# Patient Record
Sex: Male | Born: 1940 | Race: White | Hispanic: No | Marital: Single | Smoking: Former smoker
Health system: Southern US, Community
[De-identification: ages and names within clinical notes are randomized; demographics above are authoritative.]

## PROBLEM LIST (undated history)

## (undated) DIAGNOSIS — K8689 Other specified diseases of pancreas: Secondary | ICD-10-CM

## (undated) DIAGNOSIS — F329 Major depressive disorder, single episode, unspecified: Secondary | ICD-10-CM

## (undated) DIAGNOSIS — C3490 Malignant neoplasm of unspecified part of unspecified bronchus or lung: Secondary | ICD-10-CM

## (undated) DIAGNOSIS — T884XXA Failed or difficult intubation, initial encounter: Secondary | ICD-10-CM

## (undated) DIAGNOSIS — F4310 Post-traumatic stress disorder, unspecified: Secondary | ICD-10-CM

## (undated) DIAGNOSIS — F32A Depression, unspecified: Secondary | ICD-10-CM

## (undated) DIAGNOSIS — I10 Essential (primary) hypertension: Secondary | ICD-10-CM

## (undated) DIAGNOSIS — K219 Gastro-esophageal reflux disease without esophagitis: Secondary | ICD-10-CM

## (undated) DIAGNOSIS — Z9981 Dependence on supplemental oxygen: Secondary | ICD-10-CM

## (undated) DIAGNOSIS — E119 Type 2 diabetes mellitus without complications: Secondary | ICD-10-CM

## (undated) DIAGNOSIS — J45909 Unspecified asthma, uncomplicated: Secondary | ICD-10-CM

## (undated) DIAGNOSIS — I2510 Atherosclerotic heart disease of native coronary artery without angina pectoris: Secondary | ICD-10-CM

## (undated) DIAGNOSIS — G4730 Sleep apnea, unspecified: Secondary | ICD-10-CM

## (undated) DIAGNOSIS — J449 Chronic obstructive pulmonary disease, unspecified: Secondary | ICD-10-CM

## (undated) HISTORY — PX: KNEE SURGERY: SHX244

## (undated) HISTORY — PX: HERNIA REPAIR: SHX51

---

## 2004-07-23 ENCOUNTER — Inpatient Hospital Stay: Payer: Self-pay | Admitting: Internal Medicine

## 2004-09-03 ENCOUNTER — Ambulatory Visit: Payer: Self-pay | Admitting: Internal Medicine

## 2005-06-09 ENCOUNTER — Ambulatory Visit: Payer: Self-pay | Admitting: Internal Medicine

## 2005-07-07 ENCOUNTER — Emergency Department: Payer: Self-pay | Admitting: Internal Medicine

## 2005-07-31 ENCOUNTER — Ambulatory Visit: Payer: Self-pay | Admitting: Gastroenterology

## 2005-12-22 ENCOUNTER — Emergency Department: Payer: Self-pay | Admitting: Unknown Physician Specialty

## 2005-12-22 ENCOUNTER — Other Ambulatory Visit: Payer: Self-pay

## 2005-12-25 ENCOUNTER — Ambulatory Visit: Payer: Self-pay

## 2006-01-01 ENCOUNTER — Ambulatory Visit: Payer: Self-pay | Admitting: Nurse Practitioner

## 2006-05-13 ENCOUNTER — Ambulatory Visit: Payer: Self-pay | Admitting: Cardiology

## 2006-08-14 ENCOUNTER — Ambulatory Visit: Payer: Self-pay | Admitting: Internal Medicine

## 2007-03-03 ENCOUNTER — Ambulatory Visit: Payer: Self-pay | Admitting: Internal Medicine

## 2007-03-10 ENCOUNTER — Ambulatory Visit: Payer: Self-pay | Admitting: Internal Medicine

## 2007-03-18 ENCOUNTER — Ambulatory Visit: Payer: Self-pay | Admitting: Internal Medicine

## 2007-03-26 ENCOUNTER — Emergency Department: Payer: Self-pay | Admitting: Emergency Medicine

## 2007-04-07 ENCOUNTER — Ambulatory Visit: Payer: Self-pay | Admitting: Internal Medicine

## 2007-05-08 ENCOUNTER — Ambulatory Visit: Payer: Self-pay | Admitting: Internal Medicine

## 2007-07-14 ENCOUNTER — Ambulatory Visit: Payer: Self-pay | Admitting: Specialist

## 2007-11-04 ENCOUNTER — Ambulatory Visit: Payer: Self-pay | Admitting: Specialist

## 2007-12-21 ENCOUNTER — Ambulatory Visit: Payer: Self-pay | Admitting: Gastroenterology

## 2008-01-05 ENCOUNTER — Ambulatory Visit: Payer: Self-pay | Admitting: Gastroenterology

## 2008-01-06 ENCOUNTER — Ambulatory Visit: Payer: Self-pay | Admitting: Gastroenterology

## 2009-04-06 ENCOUNTER — Ambulatory Visit: Payer: Self-pay | Admitting: Specialist

## 2009-05-16 ENCOUNTER — Ambulatory Visit: Payer: Self-pay | Admitting: Specialist

## 2009-12-11 ENCOUNTER — Encounter: Payer: Self-pay | Admitting: Specialist

## 2010-01-04 ENCOUNTER — Encounter: Payer: Self-pay | Admitting: Specialist

## 2010-02-04 ENCOUNTER — Encounter: Payer: Self-pay | Admitting: Specialist

## 2010-03-07 ENCOUNTER — Encounter: Payer: Self-pay | Admitting: Specialist

## 2010-04-06 ENCOUNTER — Encounter: Payer: Self-pay | Admitting: Specialist

## 2010-04-29 ENCOUNTER — Ambulatory Visit: Payer: Self-pay | Admitting: Internal Medicine

## 2010-10-22 ENCOUNTER — Ambulatory Visit: Payer: Self-pay | Admitting: Gastroenterology

## 2010-10-23 ENCOUNTER — Ambulatory Visit: Payer: Self-pay | Admitting: Unknown Physician Specialty

## 2011-01-21 ENCOUNTER — Ambulatory Visit: Payer: Self-pay | Admitting: Gastroenterology

## 2011-01-23 LAB — PATHOLOGY REPORT

## 2011-02-25 ENCOUNTER — Ambulatory Visit: Payer: Self-pay | Admitting: Gastroenterology

## 2011-02-27 LAB — PATHOLOGY REPORT

## 2011-03-21 ENCOUNTER — Emergency Department: Payer: Self-pay | Admitting: Emergency Medicine

## 2011-05-08 ENCOUNTER — Ambulatory Visit: Payer: Self-pay | Admitting: Internal Medicine

## 2011-12-02 ENCOUNTER — Ambulatory Visit: Payer: Self-pay | Admitting: Urology

## 2011-12-03 LAB — CBC WITH DIFFERENTIAL/PLATELET
Eosinophil #: 0.3 10*3/uL (ref 0.0–0.7)
Eosinophil %: 2.6 %
HCT: 35.8 % — ABNORMAL LOW (ref 40.0–52.0)
HGB: 12.1 g/dL — ABNORMAL LOW (ref 13.0–18.0)
Lymphocyte #: 2.9 10*3/uL (ref 1.0–3.6)
Lymphocyte %: 23 %
MCHC: 33.7 g/dL (ref 32.0–36.0)
Monocyte %: 5.7 %
Neutrophil #: 8.5 10*3/uL — ABNORMAL HIGH (ref 1.4–6.5)
Neutrophil %: 67.9 %
Platelet: 200 10*3/uL (ref 150–440)
RBC: 4.24 10*6/uL — ABNORMAL LOW (ref 4.40–5.90)
RDW: 14.9 % — ABNORMAL HIGH (ref 11.5–14.5)

## 2011-12-03 LAB — BASIC METABOLIC PANEL
Anion Gap: 8 (ref 7–16)
BUN: 12 mg/dL (ref 7–18)
Calcium, Total: 8.3 mg/dL — ABNORMAL LOW (ref 8.5–10.1)
Co2: 27 mmol/L (ref 21–32)
Creatinine: 0.86 mg/dL (ref 0.60–1.30)
EGFR (Non-African Amer.): >60
Glucose: 168 mg/dL — ABNORMAL HIGH (ref 65–99)
Osmolality: 285 (ref 275–301)
Potassium: 3.8 mmol/L (ref 3.5–5.1)
Sodium: 141 mmol/L (ref 136–145)

## 2011-12-03 LAB — MAGNESIUM: Magnesium: 1.8 mg/dL

## 2011-12-04 ENCOUNTER — Emergency Department: Payer: Self-pay | Admitting: Emergency Medicine

## 2012-03-10 ENCOUNTER — Ambulatory Visit: Payer: Self-pay | Admitting: Internal Medicine

## 2012-08-02 ENCOUNTER — Ambulatory Visit: Payer: Self-pay | Admitting: Internal Medicine

## 2012-08-18 DIAGNOSIS — R350 Frequency of micturition: Secondary | ICD-10-CM | POA: Insufficient documentation

## 2012-08-18 DIAGNOSIS — N319 Neuromuscular dysfunction of bladder, unspecified: Secondary | ICD-10-CM | POA: Insufficient documentation

## 2012-08-18 DIAGNOSIS — N3281 Overactive bladder: Secondary | ICD-10-CM | POA: Insufficient documentation

## 2012-08-18 DIAGNOSIS — N138 Other obstructive and reflux uropathy: Secondary | ICD-10-CM | POA: Insufficient documentation

## 2012-08-18 DIAGNOSIS — R339 Retention of urine, unspecified: Secondary | ICD-10-CM | POA: Insufficient documentation

## 2012-08-18 DIAGNOSIS — R972 Elevated prostate specific antigen [PSA]: Secondary | ICD-10-CM | POA: Insufficient documentation

## 2012-08-18 DIAGNOSIS — N401 Enlarged prostate with lower urinary tract symptoms: Secondary | ICD-10-CM | POA: Insufficient documentation

## 2013-01-14 ENCOUNTER — Emergency Department: Payer: Self-pay | Admitting: Emergency Medicine

## 2013-01-14 LAB — COMPREHENSIVE METABOLIC PANEL
Albumin: 3.4 g/dL (ref 3.4–5.0)
Anion Gap: 7 (ref 7–16)
BUN: 14 mg/dL (ref 7–18)
Bilirubin,Total: 0.9 mg/dL (ref 0.2–1.0)
Calcium, Total: 9.5 mg/dL (ref 8.5–10.1)
Chloride: 101 mmol/L (ref 98–107)
Creatinine: 1.27 mg/dL (ref 0.60–1.30)
EGFR (Non-African Amer.): 57 — ABNORMAL LOW
Glucose: 158 mg/dL — ABNORMAL HIGH (ref 65–99)
Osmolality: 274 (ref 275–301)
Potassium: 3.8 mmol/L (ref 3.5–5.1)
SGOT(AST): 17 U/L (ref 15–37)
SGPT (ALT): 16 U/L (ref 12–78)
Sodium: 135 mmol/L — ABNORMAL LOW (ref 136–145)
Total Protein: 7.4 g/dL (ref 6.4–8.2)

## 2013-01-14 LAB — CBC
HCT: 40 % (ref 40.0–52.0)
MCHC: 33.7 g/dL (ref 32.0–36.0)
Platelet: 161 10*3/uL (ref 150–440)
RBC: 4.71 10*6/uL (ref 4.40–5.90)
WBC: 12.8 10*3/uL — ABNORMAL HIGH (ref 3.8–10.6)

## 2013-01-14 LAB — TROPONIN I: Troponin-I: <0.02 ng/mL

## 2013-07-07 ENCOUNTER — Ambulatory Visit: Payer: Self-pay | Admitting: Internal Medicine

## 2013-07-12 ENCOUNTER — Ambulatory Visit: Payer: Self-pay | Admitting: Specialist

## 2013-07-13 ENCOUNTER — Ambulatory Visit: Payer: Self-pay | Admitting: Ophthalmology

## 2013-07-18 ENCOUNTER — Ambulatory Visit: Payer: Self-pay | Admitting: Specialist

## 2013-07-18 LAB — TROPONIN I

## 2013-07-18 LAB — CBC
HCT: 44.3 % (ref 40.0–52.0)
HGB: 14.6 g/dL (ref 13.0–18.0)
MCH: 27.5 pg (ref 26.0–34.0)
MCHC: 32.9 g/dL (ref 32.0–36.0)
MCV: 83 fL (ref 80–100)
Platelet: 142 10*3/uL — ABNORMAL LOW (ref 150–440)
RBC: 5.31 10*6/uL (ref 4.40–5.90)
RDW: 16.4 % — AB (ref 11.5–14.5)
WBC: 18.3 10*3/uL — ABNORMAL HIGH (ref 3.8–10.6)

## 2013-07-18 LAB — BASIC METABOLIC PANEL
ANION GAP: 3 — AB (ref 7–16)
BUN: 12 mg/dL (ref 7–18)
CALCIUM: 9.3 mg/dL (ref 8.5–10.1)
CO2: 28 mmol/L (ref 21–32)
Chloride: 100 mmol/L (ref 98–107)
Creatinine: 1.07 mg/dL (ref 0.60–1.30)
EGFR (Non-African Amer.): >60
Glucose: 263 mg/dL — ABNORMAL HIGH (ref 65–99)
OSMOLALITY: 272 (ref 275–301)
Potassium: 4.6 mmol/L (ref 3.5–5.1)
Sodium: 131 mmol/L — ABNORMAL LOW (ref 136–145)

## 2013-07-19 ENCOUNTER — Inpatient Hospital Stay: Payer: Self-pay | Admitting: Internal Medicine

## 2013-07-19 LAB — PRO B NATRIURETIC PEPTIDE: B-Type Natriuretic Peptide: 28 pg/mL (ref 0–125)

## 2013-07-20 LAB — CBC WITH DIFFERENTIAL/PLATELET
BASOS ABS: 0 10*3/uL (ref 0.0–0.1)
BASOS PCT: 0.1 %
EOS ABS: 0 10*3/uL (ref 0.0–0.7)
EOS PCT: 0 %
HCT: 40.1 % (ref 40.0–52.0)
HGB: 13.2 g/dL (ref 13.0–18.0)
LYMPHS PCT: 3.1 %
Lymphocyte #: 0.6 10*3/uL — ABNORMAL LOW (ref 1.0–3.6)
MCH: 27.4 pg (ref 26.0–34.0)
MCHC: 32.9 g/dL (ref 32.0–36.0)
MCV: 83 fL (ref 80–100)
MONOS PCT: 2.3 %
Monocyte #: 0.5 x10 3/mm (ref 0.2–1.0)
Neutrophil #: 18.6 10*3/uL — ABNORMAL HIGH (ref 1.4–6.5)
Neutrophil %: 94.5 %
PLATELETS: 137 10*3/uL — AB (ref 150–440)
RBC: 4.81 10*6/uL (ref 4.40–5.90)
RDW: 16.6 % — ABNORMAL HIGH (ref 11.5–14.5)
WBC: 19.7 10*3/uL — ABNORMAL HIGH (ref 3.8–10.6)

## 2013-07-20 LAB — BASIC METABOLIC PANEL
Anion Gap: 6 — ABNORMAL LOW (ref 7–16)
BUN: 19 mg/dL — AB (ref 7–18)
CHLORIDE: 100 mmol/L (ref 98–107)
Calcium, Total: 9.3 mg/dL (ref 8.5–10.1)
Co2: 26 mmol/L (ref 21–32)
Creatinine: 1.04 mg/dL (ref 0.60–1.30)
GLUCOSE: 279 mg/dL — AB (ref 65–99)
Osmolality: 277 (ref 275–301)
POTASSIUM: 3.8 mmol/L (ref 3.5–5.1)
SODIUM: 132 mmol/L — AB (ref 136–145)

## 2013-07-20 LAB — APTT: Activated PTT: 37.9 secs — ABNORMAL HIGH (ref 23.6–35.9)

## 2013-07-20 LAB — PROTIME-INR
INR: 1.1
Prothrombin Time: 14.2 secs (ref 11.5–14.7)

## 2013-07-20 LAB — THEOPHYLLINE LEVEL: Theophylline: <2 ug/mL — ABNORMAL LOW (ref 10.0–20.0)

## 2013-07-21 LAB — BASIC METABOLIC PANEL WITH GFR
Anion Gap: 4 — ABNORMAL LOW
BUN: 27 mg/dL — ABNORMAL HIGH
Calcium, Total: 9.4 mg/dL
Chloride: 100 mmol/L
Co2: 28 mmol/L
Creatinine: 1.13 mg/dL
EGFR (African American): >60
EGFR (Non-African Amer.): >60
Glucose: 322 mg/dL — ABNORMAL HIGH
Osmolality: 282
Potassium: 4 mmol/L
Sodium: 132 mmol/L — ABNORMAL LOW

## 2013-07-21 LAB — CBC WITH DIFFERENTIAL/PLATELET
Basophil #: 0 10*3/uL (ref 0.0–0.1)
Basophil %: 0.1 %
EOS PCT: 0 %
Eosinophil #: 0 10*3/uL (ref 0.0–0.7)
HCT: 41.1 % (ref 40.0–52.0)
HGB: 13.4 g/dL (ref 13.0–18.0)
LYMPHS ABS: 0.6 10*3/uL — AB (ref 1.0–3.6)
Lymphocyte %: 3.2 %
MCH: 27.2 pg (ref 26.0–34.0)
MCHC: 32.7 g/dL (ref 32.0–36.0)
MCV: 83 fL (ref 80–100)
MONO ABS: 0.4 x10 3/mm (ref 0.2–1.0)
Monocyte %: 2.2 %
NEUTROS ABS: 17.7 10*3/uL — AB (ref 1.4–6.5)
NEUTROS PCT: 94.5 %
Platelet: 160 10*3/uL (ref 150–440)
RBC: 4.94 10*6/uL (ref 4.40–5.90)
RDW: 16.9 % — ABNORMAL HIGH (ref 11.5–14.5)
WBC: 18.7 10*3/uL — ABNORMAL HIGH (ref 3.8–10.6)

## 2013-07-23 LAB — CULTURE, BLOOD (SINGLE)

## 2013-07-27 ENCOUNTER — Ambulatory Visit: Payer: Self-pay | Admitting: Internal Medicine

## 2013-08-02 ENCOUNTER — Ambulatory Visit: Payer: Self-pay | Admitting: Internal Medicine

## 2013-08-07 ENCOUNTER — Ambulatory Visit: Payer: Self-pay | Admitting: Internal Medicine

## 2013-08-10 ENCOUNTER — Ambulatory Visit: Payer: Self-pay | Admitting: Internal Medicine

## 2013-08-13 LAB — PATHOLOGY REPORT

## 2013-08-29 ENCOUNTER — Ambulatory Visit: Payer: Self-pay | Admitting: Cardiothoracic Surgery

## 2013-08-29 LAB — COMPREHENSIVE METABOLIC PANEL
ALBUMIN: 3.5 g/dL (ref 3.4–5.0)
ALK PHOS: 104 U/L
ALT: 19 U/L (ref 12–78)
Anion Gap: 11 (ref 7–16)
BILIRUBIN TOTAL: 0.6 mg/dL (ref 0.2–1.0)
BUN: 14 mg/dL (ref 7–18)
CALCIUM: 9 mg/dL (ref 8.5–10.1)
CO2: 28 mmol/L (ref 21–32)
Chloride: 99 mmol/L (ref 98–107)
Creatinine: 1.22 mg/dL (ref 0.60–1.30)
EGFR (African American): >60
GFR CALC NON AF AMER: 60 — AB
GLUCOSE: 162 mg/dL — AB (ref 65–99)
Osmolality: 280 (ref 275–301)
Potassium: 4 mmol/L (ref 3.5–5.1)
SGOT(AST): 12 U/L — ABNORMAL LOW (ref 15–37)
Sodium: 138 mmol/L (ref 136–145)
Total Protein: 7.3 g/dL (ref 6.4–8.2)

## 2013-08-29 LAB — CBC CANCER CENTER
Basophil #: 0 x10 3/mm (ref 0.0–0.1)
Basophil %: 0.2 %
Eosinophil #: 0.2 x10 3/mm (ref 0.0–0.7)
Eosinophil %: 2 %
HCT: 43.5 % (ref 40.0–52.0)
HGB: 14 g/dL (ref 13.0–18.0)
Lymphocyte #: 2.9 x10 3/mm (ref 1.0–3.6)
Lymphocyte %: 27.4 %
MCH: 26.8 pg (ref 26.0–34.0)
MCHC: 32.2 g/dL (ref 32.0–36.0)
MCV: 83 fL (ref 80–100)
MONO ABS: 0.6 x10 3/mm (ref 0.2–1.0)
Monocyte %: 5.7 %
NEUTROS ABS: 6.7 x10 3/mm — AB (ref 1.4–6.5)
Neutrophil %: 64.7 %
PLATELETS: 181 x10 3/mm (ref 150–440)
RBC: 5.22 10*6/uL (ref 4.40–5.90)
RDW: 17.4 % — ABNORMAL HIGH (ref 11.5–14.5)
WBC: 10.4 x10 3/mm (ref 3.8–10.6)

## 2013-08-29 LAB — PROTIME-INR
INR: 1
Prothrombin Time: 12.8 secs (ref 11.5–14.7)

## 2013-08-29 LAB — APTT: Activated PTT: 27.4 secs (ref 23.6–35.9)

## 2013-08-31 ENCOUNTER — Ambulatory Visit: Payer: Self-pay | Admitting: Ophthalmology

## 2013-09-04 ENCOUNTER — Ambulatory Visit: Payer: Self-pay | Admitting: Cardiothoracic Surgery

## 2013-09-08 ENCOUNTER — Ambulatory Visit: Payer: Self-pay | Admitting: Cardiothoracic Surgery

## 2013-09-12 LAB — PATHOLOGY REPORT

## 2013-10-05 ENCOUNTER — Ambulatory Visit: Payer: Self-pay | Admitting: Cardiothoracic Surgery

## 2013-11-04 ENCOUNTER — Ambulatory Visit: Payer: Self-pay | Admitting: Cardiothoracic Surgery

## 2013-11-17 DIAGNOSIS — G4710 Hypersomnia, unspecified: Secondary | ICD-10-CM | POA: Insufficient documentation

## 2013-11-17 DIAGNOSIS — J449 Chronic obstructive pulmonary disease, unspecified: Secondary | ICD-10-CM | POA: Insufficient documentation

## 2013-11-17 DIAGNOSIS — I10 Essential (primary) hypertension: Secondary | ICD-10-CM | POA: Insufficient documentation

## 2013-11-17 DIAGNOSIS — G4730 Sleep apnea, unspecified: Secondary | ICD-10-CM

## 2013-11-17 DIAGNOSIS — E782 Mixed hyperlipidemia: Secondary | ICD-10-CM | POA: Insufficient documentation

## 2013-11-17 DIAGNOSIS — E7800 Pure hypercholesterolemia, unspecified: Secondary | ICD-10-CM | POA: Insufficient documentation

## 2013-11-17 DIAGNOSIS — I2510 Atherosclerotic heart disease of native coronary artery without angina pectoris: Secondary | ICD-10-CM | POA: Insufficient documentation

## 2013-11-17 DIAGNOSIS — J431 Panlobular emphysema: Secondary | ICD-10-CM | POA: Insufficient documentation

## 2014-01-24 DIAGNOSIS — Z9889 Other specified postprocedural states: Secondary | ICD-10-CM | POA: Insufficient documentation

## 2014-01-24 DIAGNOSIS — R0681 Apnea, not elsewhere classified: Secondary | ICD-10-CM | POA: Insufficient documentation

## 2014-02-14 ENCOUNTER — Ambulatory Visit: Payer: Self-pay | Admitting: Radiation Oncology

## 2014-02-15 ENCOUNTER — Ambulatory Visit: Payer: Self-pay | Admitting: Radiation Oncology

## 2014-03-07 ENCOUNTER — Ambulatory Visit: Payer: Self-pay | Admitting: Radiation Oncology

## 2014-05-07 ENCOUNTER — Ambulatory Visit: Payer: Self-pay | Admitting: Internal Medicine

## 2014-05-16 ENCOUNTER — Inpatient Hospital Stay: Payer: Self-pay | Admitting: Internal Medicine

## 2014-05-16 DIAGNOSIS — E1165 Type 2 diabetes mellitus with hyperglycemia: Secondary | ICD-10-CM | POA: Insufficient documentation

## 2014-05-16 LAB — COMPREHENSIVE METABOLIC PANEL
ANION GAP: 11 (ref 7–16)
Albumin: 3.9 g/dL (ref 3.4–5.0)
Alkaline Phosphatase: 111 U/L
BUN: 37 mg/dL — ABNORMAL HIGH (ref 7–18)
Bilirubin,Total: 0.7 mg/dL (ref 0.2–1.0)
CALCIUM: 9.2 mg/dL (ref 8.5–10.1)
CO2: 29 mmol/L (ref 21–32)
Chloride: 89 mmol/L — ABNORMAL LOW (ref 98–107)
Creatinine: 1.82 mg/dL — ABNORMAL HIGH (ref 0.60–1.30)
EGFR (Non-African Amer.): 39 — ABNORMAL LOW
GFR CALC AF AMER: 48 — AB
Glucose: 347 mg/dL — ABNORMAL HIGH (ref 65–99)
OSMOLALITY: 281 (ref 275–301)
POTASSIUM: 3.4 mmol/L — AB (ref 3.5–5.1)
SGOT(AST): 15 U/L (ref 15–37)
SGPT (ALT): 21 U/L
Sodium: 129 mmol/L — ABNORMAL LOW (ref 136–145)
Total Protein: 7.3 g/dL (ref 6.4–8.2)

## 2014-05-16 LAB — CBC
HCT: 46.3 % (ref 40.0–52.0)
HGB: 15.4 g/dL (ref 13.0–18.0)
MCH: 27.7 pg (ref 26.0–34.0)
MCHC: 33.2 g/dL (ref 32.0–36.0)
MCV: 83 fL (ref 80–100)
Platelet: 179 10*3/uL (ref 150–440)
RBC: 5.56 10*6/uL (ref 4.40–5.90)
RDW: 15.5 % — ABNORMAL HIGH (ref 11.5–14.5)
WBC: 16.4 10*3/uL — AB (ref 3.8–10.6)

## 2014-05-16 LAB — TROPONIN I: Troponin-I: <0.02 ng/mL

## 2014-05-16 LAB — CK TOTAL AND CKMB (NOT AT ARMC)
CK, TOTAL: 338 U/L — AB
CK-MB: 3.9 ng/mL — AB (ref 0.5–3.6)

## 2014-05-16 LAB — MAGNESIUM: MAGNESIUM: 1.9 mg/dL

## 2014-05-17 LAB — CBC WITH DIFFERENTIAL/PLATELET
BASOS PCT: 0.3 %
Basophil #: 0 10*3/uL (ref 0.0–0.1)
EOS PCT: 0 %
Eosinophil #: 0 10*3/uL (ref 0.0–0.7)
HCT: 43.8 % (ref 40.0–52.0)
HGB: 14.7 g/dL (ref 13.0–18.0)
Lymphocyte #: 0.7 10*3/uL — ABNORMAL LOW (ref 1.0–3.6)
Lymphocyte %: 5.5 %
MCH: 28.2 pg (ref 26.0–34.0)
MCHC: 33.6 g/dL (ref 32.0–36.0)
MCV: 84 fL (ref 80–100)
Monocyte #: 0.1 x10 3/mm — ABNORMAL LOW (ref 0.2–1.0)
Monocyte %: 0.5 %
NEUTROS PCT: 93.7 %
Neutrophil #: 11.6 10*3/uL — ABNORMAL HIGH (ref 1.4–6.5)
Platelet: 154 10*3/uL (ref 150–440)
RBC: 5.21 10*6/uL (ref 4.40–5.90)
RDW: 15.4 % — ABNORMAL HIGH (ref 11.5–14.5)
WBC: 12.4 10*3/uL — ABNORMAL HIGH (ref 3.8–10.6)

## 2014-05-17 LAB — BASIC METABOLIC PANEL
Anion Gap: 10 (ref 7–16)
BUN: 37 mg/dL — ABNORMAL HIGH (ref 7–18)
CO2: 33 mmol/L — AB (ref 21–32)
Calcium, Total: 9.3 mg/dL (ref 8.5–10.1)
Chloride: 91 mmol/L — ABNORMAL LOW (ref 98–107)
Creatinine: 1.67 mg/dL — ABNORMAL HIGH (ref 0.60–1.30)
EGFR (African American): 53 — ABNORMAL LOW
EGFR (Non-African Amer.): 43 — ABNORMAL LOW
Glucose: 332 mg/dL — ABNORMAL HIGH (ref 65–99)
Osmolality: 290 (ref 275–301)
POTASSIUM: 3.9 mmol/L (ref 3.5–5.1)
SODIUM: 134 mmol/L — AB (ref 136–145)

## 2014-05-18 LAB — CBC WITH DIFFERENTIAL/PLATELET
BASOS ABS: 0 10*3/uL (ref 0.0–0.1)
BASOS PCT: 0.1 %
EOS ABS: 0 10*3/uL (ref 0.0–0.7)
Eosinophil %: 0 %
HCT: 39.8 % — AB (ref 40.0–52.0)
HGB: 13.2 g/dL (ref 13.0–18.0)
LYMPHS ABS: 0.7 10*3/uL — AB (ref 1.0–3.6)
LYMPHS PCT: 4.4 %
MCH: 28 pg (ref 26.0–34.0)
MCHC: 33.1 g/dL (ref 32.0–36.0)
MCV: 85 fL (ref 80–100)
Monocyte #: 0.6 x10 3/mm (ref 0.2–1.0)
Monocyte %: 3.6 %
NEUTROS ABS: 15.5 10*3/uL — AB (ref 1.4–6.5)
NEUTROS PCT: 91.9 %
PLATELETS: 148 10*3/uL — AB (ref 150–440)
RBC: 4.7 10*6/uL (ref 4.40–5.90)
RDW: 15.5 % — ABNORMAL HIGH (ref 11.5–14.5)
WBC: 16.8 10*3/uL — ABNORMAL HIGH (ref 3.8–10.6)

## 2014-05-18 LAB — BASIC METABOLIC PANEL
ANION GAP: 8 (ref 7–16)
BUN: 37 mg/dL — ABNORMAL HIGH (ref 7–18)
CALCIUM: 8.8 mg/dL (ref 8.5–10.1)
Chloride: 100 mmol/L (ref 98–107)
Co2: 30 mmol/L (ref 21–32)
Creatinine: 1.34 mg/dL — ABNORMAL HIGH (ref 0.60–1.30)
EGFR (Non-African Amer.): 56 — ABNORMAL LOW
GLUCOSE: 316 mg/dL — AB (ref 65–99)
Osmolality: 296 (ref 275–301)
Potassium: 4.5 mmol/L (ref 3.5–5.1)
SODIUM: 138 mmol/L (ref 136–145)

## 2014-05-19 LAB — BASIC METABOLIC PANEL
ANION GAP: 4 — AB (ref 7–16)
BUN: 29 mg/dL — ABNORMAL HIGH (ref 7–18)
Calcium, Total: 8.3 mg/dL — ABNORMAL LOW (ref 8.5–10.1)
Chloride: 105 mmol/L (ref 98–107)
Co2: 30 mmol/L (ref 21–32)
Creatinine: 1.18 mg/dL (ref 0.60–1.30)
EGFR (African American): >60
EGFR (Non-African Amer.): >60
Glucose: 134 mg/dL — ABNORMAL HIGH (ref 65–99)
Osmolality: 285 (ref 275–301)
POTASSIUM: 4.4 mmol/L (ref 3.5–5.1)
SODIUM: 139 mmol/L (ref 136–145)

## 2014-05-19 LAB — CBC WITH DIFFERENTIAL/PLATELET
BASOS ABS: 0 10*3/uL (ref 0.0–0.1)
Basophil %: 0.1 %
Eosinophil #: 0 10*3/uL (ref 0.0–0.7)
Eosinophil %: 0 %
HCT: 40.9 % (ref 40.0–52.0)
HGB: 13.3 g/dL (ref 13.0–18.0)
LYMPHS ABS: 0.9 10*3/uL — AB (ref 1.0–3.6)
LYMPHS PCT: 5.7 %
MCH: 27.6 pg (ref 26.0–34.0)
MCHC: 32.5 g/dL (ref 32.0–36.0)
MCV: 85 fL (ref 80–100)
MONOS PCT: 2.6 %
Monocyte #: 0.4 x10 3/mm (ref 0.2–1.0)
Neutrophil #: 14.3 10*3/uL — ABNORMAL HIGH (ref 1.4–6.5)
Neutrophil %: 91.6 %
Platelet: 154 10*3/uL (ref 150–440)
RBC: 4.81 10*6/uL (ref 4.40–5.90)
RDW: 15.3 % — AB (ref 11.5–14.5)
WBC: 15.6 10*3/uL — ABNORMAL HIGH (ref 3.8–10.6)

## 2014-06-06 ENCOUNTER — Ambulatory Visit: Payer: Self-pay | Admitting: Internal Medicine

## 2014-08-17 ENCOUNTER — Ambulatory Visit: Payer: Self-pay | Admitting: Radiation Oncology

## 2014-09-05 ENCOUNTER — Ambulatory Visit
Admit: 2014-09-05 | Disposition: A | Discharge status: Home / Self Care | Payer: Self-pay | Attending: Radiation Oncology | Admitting: Radiation Oncology

## 2014-10-06 ENCOUNTER — Ambulatory Visit
Admit: 2014-10-06 | Disposition: A | Discharge status: Home / Self Care | Payer: Self-pay | Attending: Radiation Oncology | Admitting: Radiation Oncology

## 2014-10-17 ENCOUNTER — Ambulatory Visit: Admit: 2014-10-17 | Disposition: A | Discharge status: Home / Self Care | Payer: Self-pay | Admitting: Specialist

## 2014-10-28 NOTE — Consult Note (Signed)
PATIENT NAME:  Earl Clay, Earl Clay MR#:  [id number] DATE OF BIRTH:  22-Nov-1940  DATE OF CONSULTATION:  05/17/2014  REFERRING PHYSICIAN:  Leonie Clay. Earl Hutching, MD.  CONSULTING PHYSICIAN:  Earl Noxon E. Raul Del, MD  REASON FOR CONSULTATION: Shortness of breath.   HISTORY OF PRESENT ILLNESS: [name] is a 74 year old gentleman well known to [place]. I have been following him closely on an outpatient basis. He is known to have advanced COPD, on oxygen supplementation 24/7.  Obstructive sleep apnea on cpap.  History of non-small cell cancer status post radiation, here because of increased shortness of breath. On arrival he was noted to be hypoxic with oxygen saturation 75% on room air.  He was placed on 6-7 liters of oxygen.  His saturations were barely above 85%. Hence, he was admitted to the telemetry floor for what sounds like a COPD exacerbation.  He did not appear to be in CHF; not having any chest pain, leg pain, calf pain, recent travel. His chest x-ray shows a known left hilar fullness, per radiologist it is a little larger than on the previous xrays. He is also followed by oncology. At the time I saw him he was feeling better, less short of breath, coughing less, was not back to his baseline.   PAST MEDICAL HISTORY:  Hypertension, type 2 diabetes, requiring insulin, severe COPD, on oxygen supplementation, non-small cell cancer status post radiation.   ALLERGIES: AMPICILLIN, PENICILLIN, ZYBAN.  CURRENT MEDICATIONS: Finasteride 5 mg, aspirin 81 mg, enalapril 5 mg daily, Flomax 0.4 mg b.i.d., diltiazem 180 mg at bedtime, citalopram 20 mg daily, Paxil 40 mg b.i.d., trazodone 100 mg at bedtime, Lantus 42 units subcutaneously at bedtime, NovoLog sliding scale, Prevacid 80 mg b.i.d., Abilify 10 mg p.o. daily, hydroxyzine 10 mg daily, DuoNebs 1 nebulizer q.i.d., Proventil inhaler 2 puffs p.r.n., Spiriva 1 capsule daily, theophylline 300 mg b.i.d., Lasix 20 mg daily, hydrochlorothiazide 25 mg, magnesium oxide  400 mg daily, Prilosec 20 mg daily, Wellbutrin 100 mg b.i.d., vitamin B12 one thousand mcg daily. Vitamin D 3000 International Units daily.   SOCIAL HISTORY: Ex- nicotine abuser, lives alone. Does not drink any alcohol.   FAMILY HISTORY: Notable for hypertension.   REVIEW OF SYSTEMS: Denies any fever, chills, sweats. No foreign travel. Denies any headache, dizziness, passing out or sweats. He has no sore throat, oral or nasal ulceration no neck pain, stiffness, or parotid gland enlargement. Respiratory as per above. No pleurisy, hemoptysis. No chest pain, palpitations, syncope, orthopnea, or paroxysmal nocturnal dyspnea. No nausea, vomiting, diarrhea, blood in stool, dysuria, hematuria, or flank pain, rashes, lymph nodes, bleeding, TIAs, focal neurological symptoms or suicidal ideation. The rest of the review of systems today was noncontributory.   PHYSICAL EXAMINATION:  VITAL SIGNS: Temperature 97.8, pulse 113, respirations 18, blood pressure 132/74, oxygen saturation 92% on 5 liters.  GENERAL: A well-nourished male sitting in the chair, quite jovial, No audible wheezing. No use of accessory muscles. Nasal cannula O2 in place at 5 liters.  HEENT: Normocephalic, nontraumatic. Extraocular movements are signs or cords. No thrush. Has glasses on.  NECK: Supple. No jugular venous distention. No thyromegaly or stridor.  LUNGS: Bilateral breath sounds. Rare, distant wheeze. No consolidation.  CARDIAC: Tachycardia, no rubs. No obvious murmur.  ABDOMEN: Obese, soft, nontender.  EXTREMITIES: Trace edema. No Homans sign.  NEUROLOGIC: Did not ambulate, but cranial nerves II-12 appear to be grossly intact. No blackouts. No loss of sensation or reflexes.  PSYCHIATRIC:  Appropriate affect. Alert and oriented. Good insight.  LABORATORY, DIAGNOSTIC, AND RADIOLOGICAL DATA:  Magnesium 1.9, BUN 37, creatinine 1.82, sodium 129, potassium 3.4, liver enzymes were within normal limits. WBC 16.4, hemoglobin 15.4,  hematocrit 46.3, platelets 179.   Chest x-ray showed left hilar-perihilar mass, seemed to be a large that since January 2015 with linear scarring and atelectasis in the left mid chest.   IMPRESSION: This is a pleasant 74 year old gentleman, ex-smoker, known to have non-small cell cancer left hilar region, status post radiation, did not want chemotherapy. It seems that this is getting a little larger, not clear whether he is having any impingement of the airway on today's chest x-ray.  We may need to clarify with chest CT scan. Will confer with oncology. He is also known to have advanced chronic obstructive pulmonary disease, on oxygen supplementation. The shortness of breath on admission is probably due to the chronic obstructive pulmonary disease exacerbation. Hence, I recommend resuming home medications, steroid on a tapering dose, empiric antibiotics and to be on his oxygen and wean as tolerated. He is known to have obstructive sleep apnea on cpap. Will continue at same settings.  He also has hypernatremia. Some of this may be due to syndrome of inappropriate antidiuretic hormone secretion changes as a result of the mass in his lung, however, with 2 diuretics, I am wondering if that is also contributing. Will defer diuretic adjustment per PCP. He has mild renal insufficiency, following. Cautious hydration now is definitely needed. Will reassess in the morning. Thank you for the referral.     ____________________________ Earl Pester E. Raul Del, MD hef:lt D: 05/18/2014 12:34:30 ET T: 05/18/2014 13:42:07 ET JOB#: [id number]  cc: Earl Clay E. Raul Del, MD, <Dictator> Earl Pian MD ELECTRONICALLY SIGNED 05/18/2014 25:36

## 2014-10-28 NOTE — H&P (Signed)
PATIENT NAME:  Earl Clay, [name] MR#:  [id number] DATE OF BIRTH:  1940-11-07  DATE OF ADMISSION:  07/19/2013  PRIMARY CARE PHYSICIAN: Dr. Georgie Chard   REFERRING PHYSICIAN: Dr. Dahlia Client.   CHIEF COMPLAINT: Shortness of breath associated with productive cough.   HISTORY OF PRESENT ILLNESS: The patient is a 74 year old obese Caucasian male with past medical history of hypertension, diabetes mellitus, obesity, COPD and posttraumatic stress disorder. He is presenting to the ER with a chief complaint of nasal congestion and cough for the past 2 days. He reports that he is on 5 liters of oxygen and uses BiPAP during  the nighttime. He follows up with Dr. Doy Hutching and recently he was diagnosed with a lung mass, which is concerning for lung cancer. He sees Dr. Raul Del, the pulmonologist as an outpatient regarding the lung mass. He just had a PET scan of the chest in the ER yesterday. The patient's productive cough and shortness of breath has been progressively worse and yesterday he could not breath and became hypoxemic. The patient was brought into the ER via EMS from home for worsening of shortness of breath. He received several breathing treatments with no significant improvement. At the time of arrival, the patient was awake and alert and answering questions appropriately. The patient was found to be hypoxemic with a pH of 7.37, pO2 of 69. The patient was initially placed on nonrebreather and eventually he was changed to BiPAP.  He was placed on 55% of FiO2. There was a concern for developing pneumonia versus acute bronchitis. The patient was given broad-spectrum antibiotics and hospitalist team was called to admit the patient. Blood cultures were obtained prior to starting antibiotics. The patient was given meropenem as he allergic to penicillin and levofloxacin. During my examination, the patient is resting on BiPAP, but started feeling better. No family members at bedside. Denies any chest pain, abdominal  pain, nausea or vomiting.   PAST MEDICAL HISTORY: Hypertension, diabetes mellitus, obesity, COPD, posttraumatic stress disorder.   PAST SURGICAL HISTORY:  EGD.   ALLERGIES: PENICILLIN, AMPICILLIN, STEROIDS  PSYCHOSOCIAL HISTORY: Lives at home with family members. He used to smoke 3 to 4 packs of cigarettes for 50 years approximately. Denies alcohol or illicit drug usage.   FAMILY HISTORY: Mother has history of hypertension.   REVIEW OF SYSTEMS: CONSTITUTIONAL:  Denies fever, but complaining of fatigue.  EYES: Denies blurry vision, double vision.  ENT: Denies epistaxis, complaining of nasal discharge and congestion.  RESPIRATION: Has cough, has COPD, lives on 4 liters of oxygen at home.  CARDIOVASCULAR: No chest pain, palpitations.  GASTROINTESTINAL: Denies nausea, vomiting, diarrhea.  GENITOURINARY: No dysuria or hematuria.  ENDOCRINE: Denies polyuria, nocturia. Has history of diabetes mellitus and hypertension.  INTEGUMENTARY: No acne, rash, lesions.  MUSCULOSKELETAL: No joint pain in the neck. Denies gout.  NEUROLOGIC:  No vertigo or ataxia.  PSYCHIATRIC: No ADD, OCD.   HOME MEDICATIONS:  Trazodone 100 mg p.o. once daily, theophylline 300 mg 2 times a day, tamsulosin 0.4 mg once daily, prednisone 10 mg once daily. The patient states that he is not allergic to prednisone. Pravachol 80 mg p.o. once daily, NovoLog 3 times a day, Lantus 42 units subcutaneous once daily, enalapril 2.5 mg once daily, diltiazem 180 mg 1 tablet p.o. once daily,  bupropion 100 mg 2 tablets p.o. twice a day.    PHYSICAL EXAMINATION: VITAL SIGNS: Temperature 98.3, pulse  82, respirations 22, blood pressure is 128/65, pulse oximetry 97% on BiPAP.  HEENT: Normocephalic, atraumatic. Pupils  are equally reactive to light and accommodation. No scleral icterus. No conjunctival injection. No sinus tenderness. Nasal congested. No postnasal drip.  NECK: Supple. No JVD. No thyromegaly. Range of motion is intact.  LUNGS:  Moderate air entry. Coarse breath sounds, positive crackles.  CARDIAC: S1, S2 normal. Regular rate and rhythm. No murmur.  GASTROINTESTINAL: Soft. Bowel sounds are positive in all 4 quadrants. Obese, nontender, nondistended. No hepatosplenomegaly. No masses felt.  NEUROLOGIC: Awake, alert, oriented x 3. Motor, sensory grossly intact. Reflexes are 2+.  EXTREMITIES: No edema. No cyanosis. No clubbing.  SKIN: Warm to touch. Normal turgor. No rashes. No lesions. Peripheral pulses are 2+.  MUSCULOSKELETAL: No joint effusion, tenderness, erythema.   LABS AND IMAGING STUDIES: Nuclear medicine PET scan: The left upper lobe perihilar mass is intensely hypermetabolic and worrisome for primary lung neoplasm. No evidence of metastatic disease. Calcified atherosclerotic disease including multivessel coronary artery calcification. Portable chest x-ray has revealed suboptimal inspiration   bibasilar atelectasis, left greater than right. No acute cardiopulmonary disease otherwise. EKG: Sinus tachycardia with normal PR interval at 138 milliseconds, nonspecific ST-T wave changes are noted.  WBC 18.3, hemoglobin is 14.6, hematocrit 44.3, platelets of 142, on  100%, pH is 7.37, pCO2 44, pO2 69, bicarbonate is 25.4. Troponin less than 0.02. Accu-Chek 163. BMP: Glucose 263, BNP is 28, BUN 12, creatinine 1.07, sodium 131, potassium 4.6, chloride 100, CO2 28, GFR greater than 60. Anion gap 3. Normal calcium and serum osmolality.   ASSESSMENT AND PLAN: A 74 year old Caucasian male presenting to the ER with a chief complaint of nasal congestion associated with shortness of breath and cough, will be admitted with following assessment and plan.  1.  Acute hypoxic respiratory distress, probably from acute bronchitis versus developing postobstructive pneumonia with underlying upper respiratory infection. We will admit the patient to telemetry. The patient is placed on BiPAP. Blood cultures and sputum cultures were ordered. The patient  will be on meropenem and levofloxacin as he is ALLERGIC TO PENICILLIN.  2.  Lung mass concerning for primary lung cancer per Oncology.  3.  Hypertension. Blood pressure is stable at this time. We will resume home medication.  4.  Diabetes mellitus. The patient will be on Lantus and sliding scale insulin.  5.  Chronic obstructive pulmonary disease. We will provide him DuoNebs nebulizer treatments.  6.  We will provide him gastrointestinal and deep vein thrombosis prophylaxis.  7.  He is full code. Daughter is the medical power of attorney.   Diagnosis and plan of care were discussed in detail with the patient. He is aware of the plan.   The patient will be transferred to [facility] group Dr. Doy Hutching in a.m.   Total time spent on admission is 45 minutes.     ____________________________ Nicholes Mango, MD ag:dp D: 07/19/2013 07:02:01 ET T: 07/19/2013 07:39:02 ET JOB#: [id number]  cc: Nicholes Mango, MD, <Dictator> Leonie Douglas. Doy Hutching, MD Nicholes Mango MD ELECTRONICALLY SIGNED 07/31/2013 7:16

## 2014-10-28 NOTE — H&P (Signed)
PATIENT NAME:  Earl Clay, Earl Clay MR#:  [id number] DATE OF BIRTH:  [birth year]  DATE OF ADMISSION:  05/16/2014  REFERRING PHYSICIAN:  Archie Balboa, MD   PRIMARY CARE PHYSICIAN:  [name], MD   PULMONOLOGIST: Raul Del, MD   CHIEF COMPLAINT: Shortness of breath.   HISTORY OF PRESENT ILLNESS:  A 74 year old Caucasian male with history of COPD,   non-small cell lung carcinoma, completed radiation therapy, opted against chemotherapy, history of chronic respiratory failure on 5 liters nasal cannula at baseline presenting with shortness of breath; describes 1 day duration of shortness of breath with cough which is nonproductive. No fevers, chills. His condition has greatly worsened by the fact that apparently the medical company delivering his oxygen came and took his oxygen supply today leaving him without any oxygen, he thn worsened to extreme shortness of breath followed prompting him to present to the hospital for further work-up and evaluation. Following arrival he was noted to be hypoxemic, saturating about 75% on room air; currently requiring 6 to 7 liters nasal cannula to maintain oxygen saturations above 88%.   REVIEW OF SYSTEMS:  CONSTITUTIONAL: Denies fevers, chills, positive for fatigue, generalized weakness.  EYES: Denies blurred vision, double vision, eye pain.  EARS AND NOSE AND THROAT:  Denies tinnitus, ear pain, hearing loss.  RESPIRATORY: Positive for cough, shortness of breath as described above.  CARDIOVASCULAR: Denies chest pain, palpitations, or edema.  GASTROINTESTINAL: Denies nausea, vomiting, diarrhea, abdominal pain.  GENITOURINARY: Denies dysuria or hematuria.  ENDOCRINE: Denies nocturia or thyroid problems.  HEMATOLOGIC AND LYMPHATIC: Denies easy bruising or bleeding.  SKIN: Denies rashes or lesions.  MUSCULOSKELETAL: Denies pain in neck, back, shoulder, knees, hips or arthritic symptoms.  NEUROLOGIC: Denies paralysis, paresthesias.  PSYCHIATRIC: Denies anxiety  symptoms; otherwise, full review of systems performed is negative.   PAST MEDICAL HISTORY: Hypertension, type 2 diabetes, insulin requiring, uncomplicated; COPD O2 dependent, chronic respiratory failure requiring 5 liters nasal cannula at baseline, history of a non-small cell lung carcinoma, has completed radiation therapy office, he opted against chemotherapy.   SOCIAL HISTORY: Remote tobacco abuse, denies any alcohol or drug use.   FAMILY HISTORY: Positive for hypertension.   ALLERGIES: AMPICILLIN, PENICILLIN AND ZYBAN.   HOME MEDICATIONS: Include finasteride 5 mg p.o. daily, aspirin 81 mg p.o. daily, enalapril 5 mg p.o. daily, Flomax 0.4 mg p.o. b.i.d., diltiazem 180 mg p.o. at bedtime, citalopram 20 mg p.o. daily; paroxetine 40 mg p.o. b.i.d., trazodone 100 mg p.o. at bedtime, Lantus 42 units subcutaneous at bedtime, NovoLog sliding scale before meals, Prevacid 80 mg p.o. at bedtime, Abilify 10 mg p.o. daily, hydroxyzine 10 mg p.o. at bedtime, DuoNeb treatments 4 times daily as needed for shortness of breath; Proventil 90 mcg inhalation 2 puffs twice daily as needed for shortness of breath; Spiriva 18 mcg inhalation daily, theophylline 300 mg p.o. b.i.d., Lasix 20 mg p.o. daily, hydrochlorothiazide 25 mg p.o. daily, magnesium oxide 400 mg p.o. daily, Prilosec 20 mg p.o. b.i.d., bupropion 100 mg p.o. b.i.d., vitamin B12 of 1000 mcg p.o. daily, vitamin D3 of 1000 international units p.o. daily.   PHYSICAL EXAMINATION:  VITAL SIGNS: Performed on arrival temperature 98.3, heart rate 118, respirations 24, blood pressure 130/64, saturating 75% on room air, currently saturating 91% on 6 liters nasal cannula, weight is 97.1 kg, BMI 34.6.  GENERAL: Well-nourished, well-developed, Caucasian gentleman, currently in no acute distress.  HEAD: Normocephalic, atraumatic.  EYES: Pupils equal, round, reactive to light, extraocular muscles intact, no scleral icterus.  MOUTH: Moist mucosal  membrane, dentition  intact, no abscess noted.  EARS AND NOSE AND THROAT: Clear without exudates, no external lesions.  NECK: Supple. No thyromegaly. No nodules. No JVD.  PULMONARY: Greatly diminished breath sounds throughout all lung fields with scattered coarse rhonchi, primarily at the bases, no use of accessory muscles but tachypneic, poor respiratory effort.  CHEST: Nontender to palpation.  CARDIOVASCULAR: S1, S2, regular rate and rhythm, no murmurs, rubs, or gallops, no edema, pedal pulses 2+ bilaterally.  GASTROINTESTINAL: Soft, nontender, nondistended, no masses, positive bowel sounds, no hepatosplenomegaly.  MUSCULOSKELETAL: No swelling, clubbing, or edema, range of motion full in all extremities.  NEUROLOGIC: Cranial nerves II through XII intact; no gross focal neurological deficits, sensation intact, reflexes intact.  SKIN: No ulceration, lesions, rashes, or cyanosis, skin warm, dry, turgor intact.  PSYCHIATRIC: Mood and affect within normal limits. The patient is awake, alert, oriented x 3, insight and judgment intact.   DIAGNOSTIC DATA: Chest x-ray performed revealing the left hilar and perihilar mass, slightly larger than prior as well as some atelectasis of the left lung; remainder of laboratory data, sodium of 129, potassium of 3.4, chloride of 89, bicarbonate of 29, BUN 37, creatinine 1.82, glucose 347; LFTs within normal limits, WBC 16.4, hemoglobin 15.4, platelets of 179,000.    ASSESSMENT AND PLAN: A 74 year old gentleman with history of chronic obstructive pulmonary disease non-small cell lung carcinoma, chronic respiratory failure on 5 liters oxygen via nasal cannula at baseline  presenting with shortness of breath; this is obviously complicated by the fact that he ran out of oxygen today as the medical supply company came and has collected its oxygen.  1.  Chronic obstructive pulmonary disease exacerbation. Provide DuoNeb treatments q. 4 hours,  supplemental O2 to keep SaO2 greater than 88%, as well  as Solu-Medrol 60 mg IV daily.  2.  Acute kidney injury, likely prerenal in etiology with combinations of medications. We will hold his hydrochlorothiazide and Lasix for now. Provide gentle intravenous fluid hydration, follow his urine output, renal function.  3.  Hyponatremia most likely from his diuretic usage. Provide intravenous fluid hydration with normal saline as stated above, follow sodium levels.  4.  Hypokalemia. Replace potassium, with goal of 4 to 5, also check a magnesium level at this time.  5.  Type 2 diabetes. We will hold p.o. agents. Add insulin sliding scale with q. 6 hour Accu-Cheks; continue his basal Lantus.  6.  Venous thrombosis prophylaxis with heparin subcutaneous.   CODE STATUS: The patient is full code.   TIME SPENT:  Was 45 minutes.    ____________________________ Aaron Mose. Kyanna Mahrt, MD dkh:nt D: 05/16/2014 95:28:41 ET T: 05/16/2014 22:47:44 ET JOB#: [id number]  cc: Aaron Mose. Keyanni Whittinghill, MD, <Dictator> Markea Ruzich Woodfin Ganja MD ELECTRONICALLY SIGNED 05/17/2014 3:54

## 2014-10-28 NOTE — Consult Note (Signed)
Reason for Visit: This 74 year old Male patient presents to the clinic for initial evaluation of  lung cancer .   Referred by Dr. Faith Rogue.  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  Pathology Report pathology report reviewed   Imaging Report PET CT and CT scans reviewed   Referral Report clinical notes reviewed   Planned Treatment Regimen SB RT   HPI   patient is a pleasant 74 year old male oxygen dependent with long-standing history of CPD, his pulmonologist have a left upper lobe mass. CT scan showed a 3 cm mass in the left hilar region. Underwent PET/CT scan showed hypermetabolic activity in the left upper lobe mass no associated mediastinal or hilar adenopathy. No other disease outside the chest is noted. CT-guided biopsy was positive for non-small cell lung cancer. Patient has been declined from surgery by surgical oncology based on significant comorbidities with FEV1 of 40% of predicted. Patient's been on oxygen therapy for past 4-5 years. He is seen today for consideration of radiation therapy options. He is also seeing medical oncology. He specifically denies significant cough hemoptysis or chest tightness.  Past Hx:    Hypertension:    Orthopnea:    Asthma:    CPAP with O2 2 L/M at night:    O2 @ 2- 2 1/2 L/M Continuously:    Depression:    Hyperlipidemia:    PTSD:    Emphysema:    COPD:    Sleep Apnea:    Diabetes:    UPPP:    Right knee repair:    Right Linguinal hernis repair:   Past, Family and Social History:  Past Medical History positive   Cardiovascular hyperlipidemia   Respiratory asthma; COPD; oxygen dependent, emphysema, sleep apnea   Endocrine diabetes mellitus   Past Surgical History right knee repair, right herniorrhaphy repair   Past Medical History Comments posttraumatic stress disorder   Family  History noncontributory   Social History positive   Social History Comments 150-pack-year smoking history quit smoking 4 years prior no EtOH abuse history   Additional Past Medical and Surgical History accompanied by his wife today   Allergies:   Ampicillin: Rash  PCN: Rash  Zyban: Rash  Home Meds:  Home Medications:  Medication Instructions Status  enalapril 80 milligram(s) orally once a day (at bedtime) Active  traZODone 100 mg oral tablet 100 to '200mg'$  1 tab(s) orally once a day (at bedtime) Active  tamsulosin 0.4 mg oral capsule 1 cap(s) orally 2 times a day Active  predniSONE 10 mg oral tablet 1 tab(s) orally once a day Active  albuterol-ipratropium 2.5 mg-0.5 mg/3 mL inhalation solution 1  inhaled 4 times a day Active  diltiazem 180 mg/24 hours oral capsule, extended release 1 cap(s) orally once a day Active  buPROPion 100 mg oral tablet 2 tab(s) orally 2 times a day Active  cetirizine 10 mg oral tablet 1 tab(s) orally once a day (in the morning) Active  theophylline 300 mg oral tablet, extended release 1 tab(s) orally 2 times a day Active  Aspir 81 81 mg oral tablet 1 tab(s) orally once a day Active  Vitamin B-12 1000 mcg oral tablet 1 tab(s) orally once a day Active  citalopram 20 mg oral tablet 1 tab(s) orally once a day Active  Vitamin D3 1000 intl units oral capsule 1 cap(s) orally once a day Active  Lescol XL 80  mg oral tablet, extended release 1  orally once a day Active  magnesium oxide 400 mg oral tablet 1 tab(s) orally once a day Active  metFORMIN 500 mg oral tablet 1 tab(s) orally 2 times a day Active  NovoLOG FlexPen 100 units/mL subcutaneous solution 20 unit(s) subcutaneous sliding scale 2 or 3 per day Active  PARoxetine 40 milligram(s) orally 2 times a day Active  Proventil CFC free 90 mcg/inh inhalation aerosol 2 puff(s) inhaled 2 times a day Active  Spiriva 18 mcg inhalation capsule 1 puff(s) inhaled once a day Active  Breo Ellipta 100 mcg-25 mcg inhalation  powder 1 puff(s) inhaled once a day Active  Lantus Solostar Pen 100 units/mL subcutaneous solution 42 to 50 unit(s) subcutaneous once a day Active  oxygen @ 4 Liters  Active  omeprazole 20 mg oral delayed release tablet 1 tab(s) orally 2 times a day Active  Biotene Moisturizing Mouth Spray - oral spray 1 dose(s) orally once a day, As Needed for dry mouth Active   Review of Systems:  General negative   Performance Status (ECOG) 0    Skin negative   Breast negative   Ophthalmologic negative   ENMT negative   Respiratory and Thorax see HPI   Cardiovascular negative   Gastrointestinal negative   Genitourinary negative   Musculoskeletal negative   Neurological negative   Psychiatric negative   Hematology/Lymphatics negative   Endocrine negative   Allergic/Immunologic negative   Review of Systems   review of systems obtained from nurses notes.  Nursing Notes:  Nursing Vital Signs and Chemo Nursing Nursing Notes:  *CC Vital Signs Flowsheet:   18-Mar-15 09:27  Temp Temperature 96.5  Pulse Pulse 94  Respirations Respirations 24  SBP SBP 109  DBP DBP 67  Pain Scale (0-10)  0  Pulse Oxi  89  Current Weight (kg) (kg) 95.4  Height (cm) centimeters 167.6  BSA (m2) 2    10:58  Temp Temperature 97.6  Pulse Pulse 90  Respirations Respirations 20  SBP SBP 121  DBP DBP 73  Pain Scale (0-10)  0  Pulse Oxi  91  Current Weight (kg) (kg) 94.5  Height (cm) centimeters 167.6  BSA (m2) 2   Physical Exam:  General/Skin/HEENT:  Skin normal   Eyes normal   ENMT normal   Head and Neck normal   Additional PE well-developed obese male in NAD on nasal oxygen. Lungs are clear to A&P cardiac examination shows regular rate and rhythm. No cervical or supraclavicular adenopathy is identified. Abdomen is benign.   Breasts/Resp/CV/GI/GU:  Respiratory and Thorax normal   Cardiovascular normal   Gastrointestinal normal   Genitourinary normal    MS/Neuro/Psych/Lymph:  Musculoskeletal normal   Neurological normal   Lymphatics normal   Other Results:  Radiology Results:  LabUnknown:    06-Jan-15 11:10, CT Chest With Contrast  PACS Image     12-Jan-15 11:20, PET/CT Scan Lung Cancer Diagnosis  PACS Image     05-Mar-15 10:45, CT Guided Biopsy (Specify Area)  PACS Image   CT:    06-Jan-15 11:10, CT Chest With Contrast  CT Chest With Contrast   REASON FOR EXAM:    LABS FIRST Evaluate mass as seen on prior Standard chest   x-ray 07/05/13 Suspicious...  COMMENTS:       PROCEDURE: CT  - CT CHEST WITH CONTRAST  - Jul 12 2013 11:10AM     CLINICAL DATA:  74 year old male with recent abnormal chest x-ray.  Possible lung mass.  Initial encounter.    EXAM:  CT CHEST WITH CONTRAST    TECHNIQUE:  Multidetector CT imaging of the chest was performed during  intravenous contrast administration.  CONTRAST:  75 mL Isovue 370.    COMPARISON:  DG CHEST 2V dated7/05/2013; CT CHEST W/ CM dated  04/29/2010; CT CHEST W/ CM dated 08/02/2012    [facility] chest x-ray 07/05/2013 referenced in the clinical  data not available at this time.    FINDINGS:  Small surgical clips or brachy therapy foci in the left mid lung on  series 3, image 28. Situated between that area an the left hilum is  a spiculated opacity, with the confluent component measuring 27 x 23  mm (21 x 20 mm on 08/02/2012). This also appears increased in the CC  dimension, 30 mm today (previously approximately 18-20 mm).  No superimposed left hilar lymphadenopathy. No superimposed  mediastinal lymphadenopathy. No pericardial or pleural effusion.  Major mediastinal vascular structures are stable. Calcified coronary  artery atherosclerosis.    Interval improved ventilation at the left lower lobe. Interval  increased streaky in confluent opacity in the right lower lobe.  Widespread upper lobe central lobular emphysema. Major airways are  stable.    No acute or  suspicious osseous lesion identified. Visualized liver,  gallbladder, spleen, pancreas, adrenal glands, kidneys, and upper  abdominal bowel are stable and within normal limits. Negative  thoracic inlet. Stable superficial soft tissues, posterior thoracic  dermal/subcutaneous cyst.   IMPRESSION:  1. Left hilar spiculated nodule has progressed since 08/02/2012, now  27 x 23 x 30 mm. This is suspicious for bronchogenic carcinoma.  PET-CT may confirm. No associated lymphadenopathy. No distant  metastatic disease identified.  2.Nearby post treatment changes in the left upper lobe.  Emphysema.  3. Confluent right lower lobe opacity compatible with atelectasis  and/or mild pneumonia.  These results will be called to the ordering clinician or  representative by the RadiologistAssistant, and communication  documented in the PACS Dashboard.      Electronically Signed    By: Lars Pinks M.D.    On: 07/12/2013 12:17         Verified By: Gwenyth Bender. [place] Crane, M.D.,    05-Mar-15 10:45, CT Guided Biopsy (Specify Area)  CT Guided Biopsy (Specify Area)   REASON FOR EXAM:    lung mass  COMMENTS:       PROCEDURE: CT  - CT GUIDED BIOPSY or ASPIRATION  - Sep 08 2013 10:45AM     CLINICAL DATA:  Left upper lobe mass    EXAM:  CT-GUIDED BIOPSY OF A LEFT UPPER LOBE MASS.  CORE.    MEDICATIONS AND MEDICAL HISTORY:  Versed 1 mg, Fentanyl 50 mcg.    ANESTHESIA/SEDATION:  Moderate sedation time: 15 minutes  PROCEDURE:  The procedure, risks, benefits, and alternatives were explained to  the patient. Questions regarding the procedure were encouraged and  answered. The patient understands and consents to the procedure.    The left chest was prepped with Betadine in a sterile fashion, and a  sterile drape was applied covering the operative field. A sterile  gown and sterile gloves were used for the procedure.    Under CT guidance, a(n) 17 gauge guide needle was advanced into the  left upper lobe mass.  Subsequently 3 18 gauge core biopsies were  obtained. The guide needle was removed. Final imaging was performed.    Patient tolerated the procedure well without complication. Vital  sign monitoring by  nursing staff during the procedure will continue  as patient is in the special procedures unit for post procedure  observation. No evidence of postprocedure hemoptysis.    FINDINGS:  The images document guide needle placement within the left upper  lobe mass. Post biopsy images demonstrate no pneumothorax. Some  parenchymal hemorrhage is present.     IMPRESSION:  Successful CT-guided core biopsy of a left upper lobe mass.      Electronically Signed    By: Maryclare Bean M.D.    On: 09/08/2013 11:10     Verified By: Jamas Lav, M.D.,  Nuclear Med:    12-Jan-15 11:20, PET/CT Scan Lung Cancer Diagnosis  PET/CT Scan Lung Cancer Diagnosis   REASON FOR EXAM:    Left Hilar Mass  COMMENTS:       PROCEDURE: PET - PET/CT DX LUNG CA  - Jul 18 2013 11:20AM     CLINICAL DATA:  Initial treatment strategy for Lung cancer.    EXAM:  NUCLEAR MEDICINE PET SKULL BASE TO THIGH    FASTING BLOOD GLUCOSE:  Value: '163mg'$ /dl    TECHNIQUE:  12.7 mCi F-18 FDG was injected intravenously. CT data was obtained  and used for attenuation correction and anatomic localization only.  (This was not acquired as a diagnostic CT examination.) Additional  exam technical data entered on technologist worksheet.    COMPARISON:  CT from 07/12/2013    FINDINGS:  NECK    No hypermetabolic lymph nodes in the neck.    CHEST    Mild to moderate changes of centrilobular emphysema identified.  Improved appearance of subsegmental atelectasis in the posterior  right lung base. Left hilar mass within the left upper lobe measures  3.6 cm and exhibits intense FDG uptake. The SUV max is equal to 9.3.  No hypermetabolic left hilar or mediastinal adenopathy identified.  There isno hypermetabolic axillary or supraclavicular  adenopathy.    Calcified atherosclerotic disease involves the thoracic aorta as  well as the LAD, RCA and left circumflex coronary artery.    ABDOMEN/PELVIS    No abnormal hypermetabolic activity within the liver, pancreas,  adrenal glands, or spleen. No hypermetabolic lymph nodes in the  abdomen or pelvis. Calcified atherosclerotic disease affects the  abdominal aorta. There is no aneurysm.    SKELETON  No focal hypermetabolic activity to suggest skeletal metastasis.     IMPRESSION:  1. The left upper lobe perihilar mass is intensely hypermetabolic  and worrisome for primary lung neoplasm.  2. No evidence for metastatic disease.  3. Calcified atherosclerotic disease including multi vessel coronary  artery calcifications.      Electronically Signed    By: Kerby Moors M.D.    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 Plan:   patient's case was presented at our weekly tumor conference. Also discussed the case personally withDr Ma Hillock.I have reviewed the case with my physics staff and believe he would be a good candidate for SB RT with intent to deliver 5000 cGy in 5 fractions. I've ordered a CT simulation with motion projection in anticipation of performing stereotactic radiosurgery. Risks and benefits of treatment including cough loss also normal lung volume around the lesion, fatigue, possible dysphasia, and risks of  vascular compromise although extremely small were explained in detail with the patient and his wife. I have set him up for CT simulation and treatment planning early next week. Medical oncology will be reviewing the patient's chart although possible of systemic chemotherapy in this patient with multiple  medical comorbidities is limited.  I would like to take this opportunity for allowing me to participate in the care of your patient..  CC Referral:  cc: Dr. Raul Del, Dr. Fulton Reek   Electronic Signatures: Baruch Gouty, Roda Shutters (MD)  (Signed 18-Mar-15 12:27)  Authored: HPI, Diagnosis, Past Hx, PFSH, Allergies, Home Meds, ROS, Nursing Notes, Physical Exam, Other Results, Relevent Results, Encounter Assessment and Plan, CC Referring Physician   Last Updated: 18-Mar-15 12:27 by Armstead Peaks (MD)

## 2014-10-28 NOTE — Consult Note (Signed)
PATIENT NAME:  Earl Clay, DENTREMONT MR#:  [id number] DATE OF BIRTH:  [birth year]  DATE OF CONSULTATION:  07/19/2013  REFERRING PHYSICIAN: Fulton Reek, MD CONSULTING PHYSICIAN:  Denice Cardon R. Ma Hillock, MD  REASON FOR CONSULTATION: PET-positive left lung mass, likely lung cancer.   HISTORY OF PRESENT ILLNESS: The patient is a 74 year old gentleman with a past medical history significant for diabetes mellitus, hypertension, obesity, post traumatic stress disorder, history of smoking in the past, COPD, who has been admitted to hospital on January 13 with progressive shortness of breath and productive cough. The patient follows with pulmonologist, Dr. Raul Del, as outpatient, uses BiPAP during the night, and also on nasal cannula oxygen.   Initially felt to have possible pneumonia, currently on broad-spectrum antibiotic coverage, IV Solu-Medrol, and supportive treatment. Clinically states that he is feeling better and dyspnea is getting back to baseline.   The patient had recent CT scan which showed abnormal left lung lesion, and following this, he had a PET scan done on 07/18/2013 which shows left hilar mass in the left upper lobe measuring 3.6 cm with intense FDG uptake, SUV 9.3. No mediastinal adenopathy identified. No metastatic disease identified. The patient denies any chest pain or hemoptysis. Denies any changes in appetite or unintentional weight loss. Has chronic arthritic pain. Denies new bone pains.   PAST MEDICAL AND SURGICAL HISTORY: As in history of present illness above.   FAMILY HISTORY: Remarkable for hypertension. Denies malignancy.   SOCIAL HISTORY: Smoked 3 to 4 packs of cigarettes per day x50 years, currently quit. He denies alcohol or recreational drug usage. States that he remains ambulatory as much as possible.   HOME MEDICATIONS:  1.  Trazodone 100 mg p.o. daily.  2.  Theophylline 300 mg b.i.d.  3.  Prednisone 10 mg daily.  4.  Tamsulosin 0.4 mg once daily.  5.  Pravachol 80  mg daily.  6.  NovoLog insulin 3 times daily.  7.  Lantus 42 units subcutaneous once daily.  8.  Enalapril 2.5 mg once daily.  9.  Cardizem 180 mg once daily.  10.  Bupropion 100 mg two tablets b.i.d.   ALLERGIES: AMPICILLIN, ZYBAN, PENICILLIN, STEROIDS.   REVIEW OF SYSTEMS: CONSTITUTIONAL: As in history of present illness. No fevers or night sweats.  HEENT: Denies any new headaches, dizziness, epistaxis, ear or jaw pain.  CARDIAC: No angina, palpitation, orthopnea, or PND.  LUNGS: As in history of present illness above.  GASTROINTESTINAL: No nausea, vomiting, or diarrhea. No new constipation, bright red blood in stools or melena.  GENITOURINARY: No dysuria or hematuria.  SKIN: No new rashes or pruritus.  HEMATOLOGIC: Denies obvious bleeding symptoms.  MUSCULOSKELETAL: No new bone pains.  NEUROLOGIC: No new focal weakness, seizures or loss of consciousness.  ENDOCRINE: No polyuria or polydipsia. Appetite is steady.   PHYSICAL EXAMINATION:  GENERAL: A moderately built obese individual sitting in recliner chair, alert and oriented x4 and converses appropriately. On nasal cannula oxygen. No acute distress. No icterus.  VITAL SIGNS: 98.5, 96, 18, 153/76, 91% on 4 liters.  HEENT: Normocephalic, atraumatic. Extraocular movements intact. Sclerae anicteric.  NECK: Negative for lymphadenopathy.  CARDIOVASCULAR: S1, S2, regular rate and rhythm.  LUNGS: Bilateral markedly diminished breath sounds overall. No crepitations or rhonchi noted.  ABDOMEN: Soft, obese. No organomegaly clinically.  EXTREMITIES: Mild edema, no cyanosis.  SKIN: No generalized rashes or major bruising.  NEUROLOGIC: Moves all extremities spontaneously. Cranial nerves intact.  MUSCULOSKELETAL: No obvious joint redness or swelling.   LABORATORY, DIAGNOSTIC, AND RADIOLOGICAL  DATA: Blood culture negative so far x2. WBC 18,300, hemoglobin 14.6, platelets 142. Creatinine 1.07, calcium 9.3.   IMPRESSION AND RECOMMENDATIONS: A  74 year old gentleman with history of heavy smoking in the past, chronic obstructive pulmonary disease on oxygen and BiPAP, admitted with symptoms of chronic obstructive pulmonary disease exacerbation. The patient also recently was found to have abnormal CT scan of the chest, and follow-up PET scan on January 12 confirms 3.6 cm left upper lobe mass near the hilum with intensive FDG uptake, SUV of 9.3.   The patient explained about findings on CT/PET scan and that it is strongly suspicious for underlying lung cancer and that he needs further work-up. I have discussed with pulmonologist, Dr. Raul Del, over the phone today. He plans to evaluate patient in the hospital and consider either bronchoscopy or endobronchial ultrasound and get biopsy for tissue diagnosis. He will also evaluate to see if his pulmonary function would allow him to be a surgical candidate;  otherwise, the patient explained that usual treatment approach would be radiation therapy if he has a non-surgical candidate.   He does not have any metastatic lymphadenopathy or distant metastatic disease on PET scan. The patient does not have any acute pain issues at this time. We will get PT/INR and aPTT drawn in a.m. since he is being planned for invasive procedure. We will follow up after biopsy report is available and make further plan of management. The patient is agreeable to this plan.   Thank you for the referral. Please feel free to contact me if any additional questions.   ____________________________ Rhett Bannister Ma Hillock, MD srp:np D: 07/19/2013 18:19:25 ET T: 07/19/2013 18:37:49 ET JOB#: [id number]  cc: Travell Desaulniers R. Ma Hillock, MD, <Dictator> Alveta Heimlich MD ELECTRONICALLY SIGNED 07/20/2013 11:15

## 2014-10-28 NOTE — Discharge Summary (Signed)
PATIENT NAME:  Earl Clay, Earl Clay MR#:  [id number] DATE OF BIRTH:  1940/09/08  DATE OF ADMISSION:  05/16/2014 DATE OF DISCHARGE:  05/19/2014  REASON FOR ADMISSION: Shortness of breath.   HISTORY OF PRESENT ILLNESS: Please see the dictated HPI done by Dr. Lavetta Nielsen on 05/16/2014.   PAST MEDICAL HISTORY:  1.  Chronic respiratory failure, on oxygen.  2.  Chronic obstructive pulmonary disease.  3.  Lung cancer, status post radiation therapy.  4.  Type 2 diabetes.  5.  Benign hypertension.   MEDICATIONS ON ADMISSION: Please see admission note.   ALLERGIES: AMPICILLIN, PENICILLIN AND ZYBAN.   SOCIAL HISTORY: As per admission note.  FAMILY HISTORY: As per admission note.  REVIEW OF SYSTEMS: As per admission note.   PHYSICAL EXAMINATION:  GENERAL: The patient was acutely ill-appearing, in moderate distress.  VITAL SIGNS: Remarkable for a blood pressure of 134/64, heart rate 118, temperature 98.3, respiratory rate 24, saturation 91% on 6 liters.  HEENT: Normocephalic, atraumatic. Pupils equal, round and reactive to light and accommodation. Extraocular movements are intact. Sclerae are nonicteric. Conjunctivae are clear. Oropharynx is dry but clear. NECK: Supple without JVD.  LUNGS: Revealed decreased breath sounds with scattered rhonchi. No wheezes or rales.  CARDIAC: With a rapid rate with a regular rhythm. Normal S1, S2.  ABDOMEN: Soft and nontender.  EXTREMITIES: Without edema.  NEUROLOGIC: Grossly nonfocal.   HOSPITAL COURSE: The patient was admitted with acute on chronic respiratory failure with COPD exacerbation and presumed pneumonia. He was started on IV steroids with IV antibiotics. His respiratory status improved. Chest x-ray did reveal evidence of previous lung cancer, but no acute infiltrate or edema. The patient's oxygenation returned to normal. Pulmonary consultation done. Agreed with conservative measures. He was seen by physical therapy and ambulated well. Sugars remained  stable. By 05/19/2014, the patient was stable and ready for discharge.   DISCHARGE DIAGNOSES:  1.  Acute on chronic respiratory failure.  2.  Chronic obstructive pulmonary disease exacerbation.  3.  Type 2 diabetes.  4.  Benign hypertension.  5.  History of lung cancer.   DISCHARGE MEDICATIONS:  1.  Albuterol 2 puffs t.i.d.  2.  Breo Ellipta 1 puff daily.  3.  NovoLog insulin per sliding scale.  4.  Proscar 5 mg p.o. daily.  5.  Enalapril 5 mg p.o. daily.  6.  Flomax 0.4 mg p.o. b.i.d.  7.  Epogen 100 mg p.o. b.i.d.  8.  Celexa 20 mg p.o. daily.  9.  Paxil 40 mg p.o. b.i.d.  10 Zyrtec 10 mg p.o. at bedtime.  11.  Pravastatin 80 mg p.o. daily.  12.  Aripiprazole 10 mg p.o. daily.  13. Hydroxyzine 10 mg p.o. at bedtime.  14.  DuoNeb SVNs q.i.d.  15.  Cardizem CD 180 mg p.o. daily.  16.  Magnesium Oxide 400 mg p.o. daily.  17.  Trazodone 100 mg p.o. at bedtime.  18.  Aspirin 81 mg p.o. daily.  19.  Theo-Dur 300 mg p.o. b.i.d.  20.  Spiriva 1 capsule inhaled daily.  21.  Prednisone taper as directed.  22.  Levemir insulin 20 units b.i.d.  23.  Zithromax 250 mg p.o. daily x 5 days.  24.  Protonix 40 mg p.o. b.i.d.  25.  Doxycycline 100 mg p.o. b.i.d. x 10 days.   FOLLOWUP PLANS AND APPOINTMENTS: The patient was discharged on 5 liters of oxygen per nasal cannula. He will be followed by home health. He is on a carbohydrate-controlled diet. He will  follow with me in 1 week's time, sooner if needed.    ____________________________ Leonie Douglas Doy Hutching, MD jds:TT D: 06/05/2014 21:00:41 ET T: 06/05/2014 21:17:57 ET JOB#: [id number]  cc: Leonie Douglas. Doy Hutching, MD, <Dictator> Rhyen Mazariego Lennice Sites MD ELECTRONICALLY SIGNED 06/06/2014 7:55

## 2014-10-28 NOTE — Discharge Summary (Signed)
PATIENT NAME:  Earl Clay, Earl Clay MR#:  [id number] DATE OF BIRTH:  1940-08-20  DATE OF ADMISSION:  07/19/2013 DATE OF DISCHARGE:  07/21/2013  REASON FOR ADMISSION: Shortness of breath with cough.   HISTORY OF PRESENT ILLNESS: Please see the dictated history of present illness done by Dr. Margaretmary Eddy on 07/19/2013.   PAST MEDICAL HISTORY:  1.  COPD.  2.  Chronic respiratory failure.  3.  Benign hypertension.  4.  Type 2 diabetes.  5.  Obesity.  6.  Posttraumatic stress disorder.   MEDICATIONS ON ADMISSION: Please see admission note.   ALLERGIES: PENICILLIN, AMPICILLIN AND STEROIDS.   SOCIAL HISTORY, FAMILY HISTORY AND REVIEW OF SYSTEMS: As per admission note.   PHYSICAL EXAM:  GENERAL: The patient was acutely ill-appearing, in moderate distress.  VITAL SIGNS: Remarkable for a blood pressure of 128/65 with respiratory rate of 22. He was afebrile.  HEENT: Unremarkable.  NECK: Supple without JVD.  LUNGS: Coarse breath sounds with rhonchi bilaterally.  CARDIAC: Regular rate and rhythm with normal S1, S2.  ABDOMEN: Soft and nontender.  EXTREMITIES: Without edema.  NEUROLOGIC: Grossly nonfocal.   HOSPITAL COURSE: The patient was admitted with acute on chronic respiratory failure requiring BiPAP. Recent PET scan results were noted concerning for a lung mass. The patient was seen in consultation by pulmonology and oncology. He was admitted on aspirin therapy. Pulmonology recommended a bronchoscopy, but the patient had to be off aspirin for 5 days. Oncology recommended the same. The patient was quickly weaned off BiPAP to nasal cannula oxygen. He was switched from IV steroids and IV antibiotics to oral steroids and oral antibiotics. His respiratory status returned to baseline. Chest x-ray revealed no evidence of acute infiltrative process consistent with pneumonia. Since his bronchoscopy could not be done in the near future, the patient was discharged home on 07/21/2013 for outpatient followup.    DISCHARGE DIAGNOSES:  1.  Acute on chronic respiratory failure.  2.  Chronic obstructive pulmonary disease.  3.  Acute bronchitis.  4.  Lung mass.  5.  Type 2 diabetes.   DISCHARGE MEDICATIONS:  1.  Wellbutrin 200 mg p.o. b.i.d.  2.  Zyrtec 10 mg p.o. daily.  3.  Theophylline 300 mg p.o. b.i.d.  4.  Sliding scale insulin as directed.  5.  Prednisone taper as directed.  6.  Enalapril 2.5 mg p.o. at bedtime.  7.  Flomax 0.4 mg p.o. at bedtime.  8.  Trazodone 100 mg p.o. at bedtime.  9.  Lantus 50 units subcutaneous at bedtime.  10. Pravastatin 80 mg p.o. at bedtime.  11. DuoNeb SVNs q.i.d.  12. Cardizem CD 180 mg p.o. daily.  13. Protonix 40 mg p.o. daily.  14. Levaquin 500 mg p.o. daily x 1 week.  15. Oxygen at 4 liters per minute per nasal cannula.   FOLLOWUP PLANS AND APPOINTMENTS: The patient was discharged on oxygen. He is on an 1800 calorie, ADA diet. He deferred home health. He will follow up for his bronchoscopy next week with Dr. Raul Del as scheduled, sooner if needed.  ____________________________ Leonie Douglas Doy Hutching, MD jds:aw D: 07/26/2013 11:54:11 ET T: 07/26/2013 13:37:33 ET JOB#: [id number]  cc: Leonie Douglas. Doy Hutching, MD, <Dictator> Zyana Amaro Lennice Sites MD ELECTRONICALLY SIGNED 07/27/2013 8:12

## 2014-10-29 NOTE — Op Note (Signed)
PATIENT NAME:  Earl Clay, Earl Clay MR#:  [id number] DATE OF BIRTH:  1941-02-28  DATE OF PROCEDURE:  12/02/2011  PREOPERATIVE DIAGNOSES:  1. 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 2. Urinary retention.   POSTOPERATIVE DIAGNOSES:  1. 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 2. Urinary retention.   PROCEDURE: Transurethral resection of the prostate.   SURGEON: Denice Bors. Jacqlyn Larsen, MD  ANESTHESIA: Spinal.   INDICATIONS: The patient is a 74 year old gentleman with a long history of prostatic hypertrophy and bladder outlet obstructive symptoms. He recently developed urinary retention. He was treated with medical management and failed subsequent voiding trials. He was noted to have prominent median lobe formation. He presents for transurethral resection of the prostate.   DESCRIPTION OF PROCEDURE: After informed consent was obtained, the patient was taken to the Operating Room and placed in the dorsal lithotomy position under spinal anesthesia with adequate levels. The patient was then prepped and draped in the usual standard fashion. The saline resectoscope sheath was then placed utilizing the visual obturator without difficulty. Upon entering the prostatic urethra minimal to moderate bilobar hypertrophy was encountered with complete visual obstruction due to a large obstructing median lobe, moderate intravesical protrusion from the median lobe was noted. Prominent hypervascularity was present. Upon entering the bladder, inflammatory changes were noted on the posterior bladder wall and bladder base consistent with Foley catheterization. Moderate trabeculation was also noted. No other appreciable abnormalities were noted. The resectoscope was then placed through the sheath. Resection was then begun on the median lobe tissue. Bilateral ureteral orifices were easily visualized. They were a more than adequate distance from the bladder neck tissue. Resection was taken down to the  level of the trigone. The resection was then brought back to the level of the verumontanum. Additional lateral lobe tissue was then taken down. No significant anterior tissue was visualized. Once the bulk of the tissue had been resected the chips were collected and will be sent to pathology for further evaluation. The button electrode was then utilized for additional tissue resection and fulguration of the resection bed. A moderate amount of additional tissue was removed utilizing the button electrode. Once adequate hemostasis was obtained several additional fragments were irrigated free from the bladder. Revisualization demonstrated several areas of venous ooze. These were also cauterized utilizing the button electrode. Once adequate hemostasis was obtained the bladder was drained. The resectoscope was removed. A 22 French three-way Foley catheter was placed utilizing the catheter guide without difficulty. The catheter was placed to gravity drainage and to continuous bladder irrigation. The patient was returned to the supine position and was taken to the recovery room in stable condition. There were no problems or complications. The patient tolerated the procedure well.   ____________________________ Denice Bors. Jacqlyn Larsen, MD bsc:cms D: 12/02/2011 22:22:25 ET T: 12/03/2011 11:03:15 ET JOB#: [id number]  cc: Denice Bors. Jacqlyn Larsen, MD, <Dictator> Denice Bors Yumi Insalaco MD ELECTRONICALLY SIGNED 12/08/2011 7:56

## 2014-11-29 ENCOUNTER — Ambulatory Visit: Payer: Medicare Other | Attending: Specialist

## 2014-11-29 DIAGNOSIS — G4733 Obstructive sleep apnea (adult) (pediatric): Secondary | ICD-10-CM | POA: Insufficient documentation

## 2015-02-19 ENCOUNTER — Ambulatory Visit
Admission: RE | Admit: 2015-02-19 | Discharge: 2015-02-19 | Disposition: A | Discharge status: Home / Self Care | Payer: Medicare Other | Source: Ambulatory Visit | Attending: Radiation Oncology | Admitting: Radiation Oncology

## 2015-02-19 ENCOUNTER — Encounter: Payer: Self-pay | Admitting: Radiation Oncology

## 2015-02-19 ENCOUNTER — Other Ambulatory Visit: Payer: Self-pay | Admitting: *Deleted

## 2015-02-19 VITALS — BP 119/76 | HR 97 | Temp 96.8°F | Resp 20 | Wt 191.2 lb

## 2015-02-19 DIAGNOSIS — Z85118 Personal history of other malignant neoplasm of bronchus and lung: Secondary | ICD-10-CM

## 2015-02-19 HISTORY — DX: Malignant neoplasm of unspecified part of unspecified bronchus or lung: C34.90

## 2015-02-19 HISTORY — DX: Type 2 diabetes mellitus without complications: E11.9

## 2015-02-19 NOTE — Progress Notes (Signed)
Radiation Oncology Follow up Note  Name: Earl Clay   Date:   02/19/2015 MRN:  [id number] DOB: 1940/09/21    This 74 y.o. 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  REFERRING PROVIDER: No ref. provider found  HPI: Patient is a 74 year old male now 15 months out having completed SB RT to the left lung for a T2 N0 non-small cell lung cancer. His last CT scan showed a new 6 mm lesion in the right lung which we are following. Otherwise she'll complete resolution of his left lung mass. Patient is on continuous nasal oxygen. Does have a productive cough that time no hemoptysis. He states his breathing has stabilized he is overall doing well..  COMPLICATIONS OF TREATMENT: none  FOLLOW UP COMPLIANCE: keeps appointments   PHYSICAL EXAM:  BP 119/76 mmHg  Pulse 97  Temp(Src) 96.8 F (36 C)  Resp 20  Wt 191 lb 4 oz (86.75 kg) Well-developed male on nasal oxygen in NAD. Well-developed well-nourished patient in NAD. HEENT reveals PERLA, EOMI, discs not visualized.  Oral cavity is clear. No oral mucosal lesions are identified. Neck is clear without evidence of cervical or supraclavicular adenopathy. Lungs are clear to A&P. Cardiac examination is essentially unremarkable with regular rate and rhythm without murmur rub or thrill. Abdomen is benign with no organomegaly or masses noted. Motor sensory and DTR levels are equal and symmetric in the upper and lower extremities. Cranial nerves II through XII are grossly intact. Proprioception is intact. No peripheral adenopathy or edema is identified. No motor or sensory levels are noted. Crude visual fields are within normal range.   RADIOLOGY RESULTS: Previous CT scan is reviewed compatible with the above-stated findings.  PLAN: I've ordered another CT scan of the chest with contrast in the next several weeks. Otherwise I've asked to see him back in 6 months for  follow-up. Should the right lung lesion have progressed May continue to observe or try to biopsy that. Otherwise I'm please was overall progress. Patient knows to call with any concerns.  I would like to take this opportunity for allowing me to participate in the care of your patient.Armstead Peaks., MD

## 2015-03-07 ENCOUNTER — Other Ambulatory Visit: Payer: Self-pay | Admitting: Gastroenterology

## 2015-03-07 ENCOUNTER — Ambulatory Visit
Admission: RE | Admit: 2015-03-07 | Discharge: 2015-03-07 | Disposition: A | Discharge status: Home / Self Care | Payer: Medicare Other | Source: Ambulatory Visit | Attending: Radiation Oncology | Admitting: Radiation Oncology

## 2015-03-07 DIAGNOSIS — I2510 Atherosclerotic heart disease of native coronary artery without angina pectoris: Secondary | ICD-10-CM | POA: Diagnosis not present

## 2015-03-07 DIAGNOSIS — R911 Solitary pulmonary nodule: Secondary | ICD-10-CM | POA: Diagnosis not present

## 2015-03-07 DIAGNOSIS — J439 Emphysema, unspecified: Secondary | ICD-10-CM | POA: Insufficient documentation

## 2015-03-07 DIAGNOSIS — Z85118 Personal history of other malignant neoplasm of bronchus and lung: Secondary | ICD-10-CM | POA: Diagnosis not present

## 2015-03-07 DIAGNOSIS — D126 Benign neoplasm of colon, unspecified: Secondary | ICD-10-CM

## 2015-03-07 MED ORDER — IOHEXOL 300 MG/ML  SOLN
75.0000 mL | Freq: Once | INTRAMUSCULAR | Status: AC | PRN
  Administered 2015-03-07: 75 mL via INTRAVENOUS

## 2015-03-15 ENCOUNTER — Telehealth: Payer: Self-pay | Admitting: *Deleted

## 2015-03-15 ENCOUNTER — Other Ambulatory Visit: Payer: Self-pay | Admitting: *Deleted

## 2015-03-15 DIAGNOSIS — Z85118 Personal history of other malignant neoplasm of bronchus and lung: Secondary | ICD-10-CM

## 2015-03-15 NOTE — Telephone Encounter (Signed)
Wants to discuss Ct scan results

## 2015-03-15 NOTE — Telephone Encounter (Signed)
Earl Clay told that per Dr. Baruch Gouty, the CT scan showed a small nodule that he wants to watch. An appointment was made for a CT scan on 09/04/15 and a follow up with Dr. Baruch Gouty for 09/10/15.  Earl Clay acknowledged the appointments and asked that they also be mailed.

## 2015-03-27 ENCOUNTER — Ambulatory Visit
Admission: RE | Admit: 2015-03-27 | Discharge: 2015-03-27 | Disposition: A | Discharge status: Home / Self Care | Payer: Medicare Other | Source: Ambulatory Visit | Attending: Gastroenterology | Admitting: Gastroenterology

## 2015-03-27 DIAGNOSIS — D126 Benign neoplasm of colon, unspecified: Secondary | ICD-10-CM

## 2015-07-20 DIAGNOSIS — E119 Type 2 diabetes mellitus without complications: Secondary | ICD-10-CM | POA: Insufficient documentation

## 2015-07-23 ENCOUNTER — Other Ambulatory Visit: Payer: Self-pay | Admitting: Internal Medicine

## 2015-07-23 DIAGNOSIS — R918 Other nonspecific abnormal finding of lung field: Secondary | ICD-10-CM

## 2015-07-27 ENCOUNTER — Ambulatory Visit
Admission: RE | Admit: 2015-07-27 | Discharge: 2015-07-27 | Disposition: A | Discharge status: Home / Self Care | Payer: Medicare Other | Source: Ambulatory Visit | Attending: Radiation Oncology | Admitting: Radiation Oncology

## 2015-07-27 DIAGNOSIS — R918 Other nonspecific abnormal finding of lung field: Secondary | ICD-10-CM | POA: Insufficient documentation

## 2015-07-27 DIAGNOSIS — Z85118 Personal history of other malignant neoplasm of bronchus and lung: Secondary | ICD-10-CM | POA: Diagnosis not present

## 2015-07-27 MED ORDER — IOHEXOL 300 MG/ML  SOLN
75.0000 mL | Freq: Once | INTRAMUSCULAR | Status: AC | PRN
  Administered 2015-07-27: 75 mL via INTRAVENOUS

## 2015-08-08 ENCOUNTER — Encounter: Payer: Self-pay | Admitting: Oncology

## 2015-08-08 ENCOUNTER — Inpatient Hospital Stay: Payer: Medicare Other

## 2015-08-08 ENCOUNTER — Inpatient Hospital Stay: Payer: Medicare Other | Attending: Cardiothoracic Surgery | Admitting: Oncology

## 2015-08-08 VITALS — BP 126/85 | HR 116 | Temp 96.7°F | Resp 18 | Wt 175.1 lb

## 2015-08-08 DIAGNOSIS — E119 Type 2 diabetes mellitus without complications: Secondary | ICD-10-CM | POA: Insufficient documentation

## 2015-08-08 DIAGNOSIS — J45909 Unspecified asthma, uncomplicated: Secondary | ICD-10-CM | POA: Diagnosis not present

## 2015-08-08 DIAGNOSIS — R531 Weakness: Secondary | ICD-10-CM | POA: Insufficient documentation

## 2015-08-08 DIAGNOSIS — R5383 Other fatigue: Secondary | ICD-10-CM | POA: Diagnosis not present

## 2015-08-08 DIAGNOSIS — C3412 Malignant neoplasm of upper lobe, left bronchus or lung: Secondary | ICD-10-CM

## 2015-08-08 DIAGNOSIS — Z794 Long term (current) use of insulin: Secondary | ICD-10-CM | POA: Diagnosis not present

## 2015-08-08 DIAGNOSIS — E785 Hyperlipidemia, unspecified: Secondary | ICD-10-CM | POA: Diagnosis not present

## 2015-08-08 DIAGNOSIS — Z87891 Personal history of nicotine dependence: Secondary | ICD-10-CM | POA: Diagnosis not present

## 2015-08-08 DIAGNOSIS — Z7982 Long term (current) use of aspirin: Secondary | ICD-10-CM | POA: Diagnosis not present

## 2015-08-08 DIAGNOSIS — Z923 Personal history of irradiation: Secondary | ICD-10-CM | POA: Insufficient documentation

## 2015-08-08 DIAGNOSIS — R918 Other nonspecific abnormal finding of lung field: Secondary | ICD-10-CM | POA: Diagnosis not present

## 2015-08-08 DIAGNOSIS — Z9981 Dependence on supplemental oxygen: Secondary | ICD-10-CM | POA: Diagnosis not present

## 2015-08-08 DIAGNOSIS — I10 Essential (primary) hypertension: Secondary | ICD-10-CM | POA: Diagnosis not present

## 2015-08-08 DIAGNOSIS — Z79899 Other long term (current) drug therapy: Secondary | ICD-10-CM | POA: Diagnosis not present

## 2015-08-08 DIAGNOSIS — J449 Chronic obstructive pulmonary disease, unspecified: Secondary | ICD-10-CM

## 2015-08-08 DIAGNOSIS — K869 Disease of pancreas, unspecified: Secondary | ICD-10-CM | POA: Insufficient documentation

## 2015-08-08 DIAGNOSIS — R634 Abnormal weight loss: Secondary | ICD-10-CM | POA: Insufficient documentation

## 2015-08-08 LAB — COMPREHENSIVE METABOLIC PANEL
ALT: 9 U/L — AB (ref 17–63)
AST: 14 U/L — AB (ref 15–41)
Albumin: 3.8 g/dL (ref 3.5–5.0)
Alkaline Phosphatase: 112 U/L (ref 38–126)
Anion gap: 10 (ref 5–15)
BILIRUBIN TOTAL: 0.4 mg/dL (ref 0.3–1.2)
BUN: 20 mg/dL (ref 6–20)
CO2: 26 mmol/L (ref 22–32)
CREATININE: 1.26 mg/dL — AB (ref 0.61–1.24)
Calcium: 9.3 mg/dL (ref 8.9–10.3)
Chloride: 94 mmol/L — ABNORMAL LOW (ref 101–111)
GFR calc Af Amer: >60 mL/min (ref >60)
GFR, EST NON AFRICAN AMERICAN: 55 mL/min — AB (ref >60)
GLUCOSE: 253 mg/dL — AB (ref 65–99)
Potassium: 3.5 mmol/L (ref 3.5–5.1)
Sodium: 130 mmol/L — ABNORMAL LOW (ref 135–145)
TOTAL PROTEIN: 7.8 g/dL (ref 6.5–8.1)

## 2015-08-08 LAB — TSH: TSH: 4.217 u[IU]/mL (ref 0.350–4.500)

## 2015-08-08 LAB — CBC WITH DIFFERENTIAL/PLATELET
BASOS ABS: 0.1 10*3/uL (ref 0–0.1)
Basophils Relative: 1 %
Eosinophils Absolute: 0.3 10*3/uL (ref 0–0.7)
Eosinophils Relative: 3 %
HEMATOCRIT: 38.2 % — AB (ref 40.0–52.0)
HEMOGLOBIN: 13 g/dL (ref 13.0–18.0)
LYMPHS PCT: 15 %
Lymphs Abs: 1.2 10*3/uL (ref 1.0–3.6)
MCH: 28.4 pg (ref 26.0–34.0)
MCHC: 33.9 g/dL (ref 32.0–36.0)
MCV: 83.6 fL (ref 80.0–100.0)
Monocytes Absolute: 0.6 10*3/uL (ref 0.2–1.0)
Monocytes Relative: 7 %
NEUTROS ABS: 5.8 10*3/uL (ref 1.4–6.5)
NEUTROS PCT: 74 %
Platelets: 200 10*3/uL (ref 150–440)
RBC: 4.58 MIL/uL (ref 4.40–5.90)
RDW: 14.7 % — ABNORMAL HIGH (ref 11.5–14.5)
WBC: 7.9 10*3/uL (ref 3.8–10.6)

## 2015-08-08 NOTE — Progress Notes (Signed)
Patient here as referral of Dr. Donella Stade for lung cancer.

## 2015-08-08 NOTE — Progress Notes (Signed)
Earl Clay @ [facility] [phone]  [phone] [place]: [birth year]  MR#: [id number]  YIR#:[id number]  Patient Care Team: [name], MD as PCP - General (Internal Medicine)  CHIEF COMPLAINT:  Chief Complaint  Patient presents with  . Lung Cancer   Carcinoma of lung T1 N1 M0 tumor previously seen by radiation oncologist and radiation therapy approximately March of 2015  Significant weight loss VISIT DIAGNOSIS:  No diagnosis found.    No history exists.    No flowsheet data found.  INTERVAL HISTORY: patient is a pleasant 75 year old male oxygen dependent with long-standing history of CPD, his pulmonologist have a left upper lobe mass. CT scan showed a 3 cm mass in the left hilar region. Underwent PET/CT scan showed hypermetabolic activity in the left upper lobe mass no associated mediastinal or hilar adenopathy. No other disease outside the chest is noted. CT-guided biopsy was positive for non-small cell lung cancer. Patient has been declined from surgery by surgical oncology based on significant comorbidities with FEV1 of 40% of predicted. Patient's been on oxygen therapy for past 4-5 years. He is seen today for consideration of radiation therapy options. He is also seeing medical oncology. He specifically denies significant cough hemoptysis or chest tightness..  Recently patient started losing weight and has lost approximately 50 pounds in last 4 months.  Appetite has been stable.  No hemoptysis no chest pain.  No diarrhea no rectal bleeding or hematuria  Patient had a repeat CT scan which revealed new left upper lobe nodule and patient was referred to me for further evaluation regarding weight loss and possibility of recurrent or progressive carcinoma of lung REVIEW OF SYSTEMS:    general status: Patient is feeling weak and tired.  No change in a performance status.  No chills.  No fever. Significant weight loss  as  mentioned above HEENT:  No evidence of stomatitis.   No  headache Lungs:  Has increasing shortness of breath on oxygen he has been on oxygen for several months   has a history COPD Cardiac: No chest pain or paroxysmal nocturnal dyspnea GI: No nausea no vomiting no diarrhea no abdominal pain Skin: No rash Lower extremity no swelling Neurological system: No tingling.  No numbness.  No other focal signs Musculoskeletal system no bony pains    As per HPI. Otherwise, a complete review of systems is negatve.  PAST MEDICAL HISTORY: Past Medical History  Diagnosis Date  . Lung cancer ([place])   . Diabetes mellitus without complication ([place])     Expand All Collapse All   Reason for Visit: This 75 year old Male patient presents to the clinic for initial evaluation of lung cancer .   Referred by Dr. Faith Rogue.  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               HPI   patient is a pleasant 75 year old male oxygen dependent with long-standing history of CPD, his pulmonologist have a left upper lobe mass. CT scan showed a 3 cm mass in the left hilar region. Underwent PET/CT scan showed hypermetabolic activity in the left upper lobe mass no associated mediastinal or hilar adenopathy. No other disease outside the chest is noted. CT-guided biopsy was positive for non-small cell lung cancer. Patient has been declined from surgery by surgical oncology based on significant comorbidities with  FEV1 of 40% of predicted. Patient's been on oxygen therapy for past 4-5 years. He is seen today for consideration of radiation therapy options. He is also seeing medical oncology. He specifically denies significant cough hemoptysis or chest tightness.  Patient also has insulin-dependent diabetes  Past Hx:   Hypertension:   Orthopnea:   Asthma:    CPAP with O2 2 L/M at night:   O2 @ 2- 2 1/2 L/M Continuously:   Depression:   Hyperlipidemia:   PTSD:   Emphysema:   COPD:   Sleep Apnea:   Diabetes:   UPPP:   Right knee repair:   Right Linguinal hernis repair:   Past, Family and Social History:  Past Medical History positive   Cardiovascular hyperlipidemia   Respiratory asthma; COPD; oxygen dependent, emphysema, sleep apnea   Endocrine diabetes mellitus   Past Surgical History right knee repair, right herniorrhaphy repair   Past Medical History Comments posttraumatic stress disorder   Family History noncontributory   Social History positive   Social History Comments 150-pack-year smoking history quit smoking 4 years prior no EtOH abuse history   Additional Past Medical and Surgical History accompanied by his wife today   Allergies:  Ampicillin: Rash PCN: Rash Zyban: Rash        ADVANCED DIRECTIVES:   Patient does have advance healthcare directive, Patient   does not desire to make any changes      HEALTH MAINTENANCE: Social History  Substance Use Topics  . Smoking status: Former Research scientist (life sciences)  . Smokeless tobacco: Never Used  . Alcohol Use: Not on file    Allergies  Allergen Reactions  . Penicillin V Potassium Hives  . Ampicillin Hives    Other reaction(s): HIVES    Current Outpatient Prescriptions  Medication Sig Dispense Refill  . albuterol (PROAIR HFA) 108 (90 BASE) MCG/ACT inhaler Inhale into the lungs.    Marland Kitchen albuterol (PROVENTIL) (2.5 MG/3ML) 0.083% nebulizer solution 2.5 mg.    . aspirin 81 MG tablet Take 81 mg by mouth.    Marland Kitchen buPROPion (WELLBUTRIN) 100 MG tablet Take 100 mg by mouth.    . cetirizine (ZYRTEC) 10 MG tablet Take 10 mg by mouth.    . citalopram (CELEXA) 20 MG tablet Take 20 mg by mouth.    . diltiazem (CARDIZEM CD) 180 MG 24 hr capsule Take by mouth.    . diltiazem (TIAZAC) 180 MG  24 hr capsule Take 180 mg by mouth.    . enalapril (VASOTEC) 2.5 MG tablet Take by mouth.    . fluvastatin XL (LESCOL XL) 80 MG 24 hr tablet Take 80 mg by mouth.    . furosemide (LASIX) 20 MG tablet Take 20 mg by mouth.    . hydrochlorothiazide (HYDRODIURIL) 25 MG tablet TAKE 1 TABLET BY MOUTH ONCE A DAY    . hydrOXYzine (ATARAX/VISTARIL) 10 MG tablet TAKE 1 TABLET BY MOUTH AT BEDTIME.    . insulin aspart (NOVOLOG) 100 UNIT/ML injection Three (3) times a day. Frequency:UNKNOWN   Dosage:0.0     Instructions:  Note:Dose: 100/ML    . insulin detemir (LEVEMIR) 100 UNIT/ML injection Inject 30 Units into the skin once.    . Insulin Pen Needle (B-D ULTRAFINE III SHORT PEN) 31G X 8 MM MISC USE AS DIRECTED    . magnesium oxide (MAG-OX) 400 MG tablet Take 400 mg by mouth.    Marland Kitchen omeprazole (PRILOSEC) 20 MG capsule Take 20 mg by mouth.    . pantoprazole (PROTONIX) 40 MG tablet     .  PARoxetine (PAXIL) 40 MG tablet Take 40 mg by mouth.    . pravastatin (PRAVACHOL) 80 MG tablet Take 80 mg by mouth.    . sildenafil (VIAGRA) 100 MG tablet Take 100 mg by mouth.    . tamsulosin (FLOMAX) 0.4 MG CAPS capsule Take 0.4 mg by mouth.    . theophylline (THEO-24) 300 MG 24 hr capsule Take by mouth.    . tiotropium (SPIRIVA) 18 MCG inhalation capsule Place into inhaler and inhale.    . traZODone (DESYREL) 100 MG tablet Take 100 mg by mouth.     No current facility-administered medications for this visit.    OBJECTIVE: PHYSICAL EXAM: Gen. Status: Patient is alert oriented in mild respiratory distress has oxygen dependent. Lymphatic system: Supraclavicular, cervical, axillary, inguinal lymph nodes are not palpable Head exam was generally normal. There was no scleral icterus or corneal arcus. Mucous membranes were moist. Lungs: Emphysematous chest.  Rhonchi on both sides. Cardiac: Soft systolic murmur.  Normal heart sounds Abdomen: Protuberant.  No ascites.  Spleen and liver not palpable Lower extremities trace  edema Skin: No rash Neurological system no localizing signs Musculoskeletal system no abnormality All other systems have been reviewed and no positive findings  Filed Vitals:   08/08/15 0818  BP: 126/85  Pulse: 116  Temp: 96.7 F (35.9 C)  Resp: 18     There is no height on file to calculate BMI.    ECOG FS:2 - Symptomatic, <50% confined to bed  LAB RESULTS:  No visits with results within 5 Day(s) from this visit. Latest known visit with results is:  [facility] Conversion on 05/16/2014  Component Date Value Ref Range Status  . CK, Total 05/16/2014 338*  Final   Comment: 39-308 NOTE: NEW REFERENCE RANGE  08/08/2013   . CK-MB 05/16/2014 3.9* 0.5-3.6 ng/mL Final  . WBC 05/16/2014 16.4* 3.8-10.6 x10 3/mm 3 Final  . RBC 05/16/2014 5.56  4.40-5.90 x10 6/mm 3 Final  . HGB 05/16/2014 15.4  13.0-18.0 g/dL Final  . HCT 05/16/2014 46.3  40.0-52.0 % Final  . MCV 05/16/2014 83  80-100 fL Final  . MCH 05/16/2014 27.7  26.0-34.0 pg Final  . MCHC 05/16/2014 33.2  32.0-36.0 g/dL Final  . RDW 05/16/2014 15.5* 11.5-14.5 % Final  . Platelet 05/16/2014 179  150-440 x10 3/mm 3 Final  . Glucose 05/16/2014 347* 65-99 mg/dL Final  . BUN 05/16/2014 37* 7-18 mg/dL Final  . Creatinine 05/16/2014 1.82* 0.60-1.30 mg/dL Final  . Sodium 05/16/2014 129* 136-145 mmol/L Final  . Potassium 05/16/2014 3.4* 3.5-5.1 mmol/L Final  . Chloride 05/16/2014 89* 98-107 mmol/L Final  . Co2 05/16/2014 29  21-32 mmol/L Final  . Calcium, Total 05/16/2014 9.2  8.5-10.1 mg/dL Final  . SGOT(AST) 05/16/2014 15  15-37 Unit/L Final  . SGPT (ALT) 05/16/2014 21   Final   Comment: 14-63 NOTE: New Reference Range 01/24/14   . Alkaline Phosphatase 05/16/2014 111   Final   Comment: 46-116 NOTE: New Reference Range 01/24/14   . Albumin 05/16/2014 3.9  3.4-5.0 g/dL Final  . Total Protein 05/16/2014 7.3  6.4-8.2 g/dL Final  . Bilirubin,Total 05/16/2014 0.7  0.2-1.0 mg/dL Final  . Osmolality 05/16/2014 281  275-301 Final  .  Anion Gap 05/16/2014 11  7-16 Final  . EGFR (African American) 05/16/2014 48* >40m/min Final  . EGFR (Non-African Amer.) 05/16/2014 39* >633mmin Final   Comment: eGFR values <6089min/1.73 m2 may be an indication of chronic kidney disease (CKD). Calculated eGFR, using the MRDR Study equation, is  useful in  patients with stable renal function. The eGFR calculation will not be reliable in acutely ill patients when serum creatinine is changing rapidly. It is not useful in patients on dialysis. The eGFR calculation may not be applicable to patients at the low and high extremes of body sizes, pregnant women, and vegetarians.   . Troponin-I 05/16/2014 < 0.02   Final   Comment: 0.00-0.05 0.05 ng/mL or less: NEGATIVE  Repeat testing in 3-6 hrs  if clinically indicated. >0.05 ng/mL: POTENTIAL  MYOCARDIAL INJURY. Repeat  testing in 3-6 hrs if  clinically indicated. NOTE: An increase or decrease  of 30% or more on serial  testing suggests a  clinically important change   . Magnesium 05/16/2014 1.9   Final   Comment: 1.8-2.4 THERAPEUTIC RANGE: 4-7 mg/dL TOXIC: > 10 mg/dL  -----------------------   . WBC 05/17/2014 12.4* 3.8-10.6 x10 3/mm 3 Final  . RBC 05/17/2014 5.21  4.40-5.90 x10 6/mm 3 Final  . HGB 05/17/2014 14.7  13.0-18.0 g/dL Final  . HCT 05/17/2014 43.8  40.0-52.0 % Final  . MCV 05/17/2014 84  80-100 fL Final  . MCH 05/17/2014 28.2  26.0-34.0 pg Final  . MCHC 05/17/2014 33.6  32.0-36.0 g/dL Final  . RDW 05/17/2014 15.4* 11.5-14.5 % Final  . Platelet 05/17/2014 154  150-440 x10 3/mm 3 Final  . Neutrophil % 05/17/2014 93.7   Final  . Lymphocyte % 05/17/2014 5.5   Final  . Monocyte % 05/17/2014 0.5   Final  . Eosinophil % 05/17/2014 0.0   Final  . Basophil % 05/17/2014 0.3   Final  . Neutrophil # 05/17/2014 11.6* 1.4-6.5 x10 3/mm 3 Final  . Lymphocyte # 05/17/2014 0.7* 1.0-3.6 x10 3/mm 3 Final  . Monocyte # 05/17/2014 0.1* 0.2-1.0 x10 3/mm  Final  . Eosinophil #  05/17/2014 0.0  0.0-0.7 x10 3/mm 3 Final  . Basophil # 05/17/2014 0.0  0.0-0.1 x10 3/mm 3 Final  . Glucose 05/17/2014 332* 65-99 mg/dL Final  . BUN 05/17/2014 37* 7-18 mg/dL Final  . Creatinine 05/17/2014 1.67* 0.60-1.30 mg/dL Final  . Sodium 05/17/2014 134* 136-145 mmol/L Final  . Potassium 05/17/2014 3.9  3.5-5.1 mmol/L Final  . Chloride 05/17/2014 91* 98-107 mmol/L Final  . Co2 05/17/2014 33* 21-32 mmol/L Final  . Calcium, Total 05/17/2014 9.3  8.5-10.1 mg/dL Final  . Osmolality 05/17/2014 290  275-301 Final  . Anion Gap 05/17/2014 10  7-16 Final  . EGFR (African American) 05/17/2014 53* >76m/min Final  . EGFR (Non-African Amer.) 05/17/2014 43* >633mmin Final   Comment: eGFR values <6049min/1.73 m2 may be an indication of chronic kidney disease (CKD). Calculated eGFR, using the MRDR Study equation, is useful in  patients with stable renal function. The eGFR calculation will not be reliable in acutely ill patients when serum creatinine is changing rapidly. It is not useful in patients on dialysis. The eGFR calculation may not be applicable to patients at the low and high extremes of body sizes, pregnant women, and vegetarians.   . WBC 05/18/2014 16.8* 3.8-10.6 x10 3/mm 3 Final  . RBC 05/18/2014 4.70  4.40-5.90 x10 6/mm 3 Final  . HGB 05/18/2014 13.2  13.0-18.0 g/dL Final  . HCT 05/18/2014 39.8* 40.0-52.0 % Final  . MCV 05/18/2014 85  80-100 fL Final  . MCH 05/18/2014 28.0  26.0-34.0 pg Final  . MCHC 05/18/2014 33.1  32.0-36.0 g/dL Final  . RDW 05/18/2014 15.5* 11.5-14.5 % Final  . Platelet 05/18/2014 148* 150-440 x10 3/mm 3 Final  . Neutrophil %  05/18/2014 91.9   Final  . Lymphocyte % 05/18/2014 4.4   Final  . Monocyte % 05/18/2014 3.6   Final  . Eosinophil % 05/18/2014 0.0   Final  . Basophil % 05/18/2014 0.1   Final  . Neutrophil # 05/18/2014 15.5* 1.4-6.5 x10 3/mm 3 Final  . Lymphocyte # 05/18/2014 0.7* 1.0-3.6 x10 3/mm 3 Final  . Monocyte # 05/18/2014 0.6  0.2-1.0 x10  3/mm  Final  . Eosinophil # 05/18/2014 0.0  0.0-0.7 x10 3/mm 3 Final  . Basophil # 05/18/2014 0.0  0.0-0.1 x10 3/mm 3 Final  . Glucose 05/18/2014 316* 65-99 mg/dL Final  . BUN 05/18/2014 37* 7-18 mg/dL Final  . Creatinine 05/18/2014 1.34* 0.60-1.30 mg/dL Final  . Sodium 05/18/2014 138  136-145 mmol/L Final  . Potassium 05/18/2014 4.5  3.5-5.1 mmol/L Final  . Chloride 05/18/2014 100  98-107 mmol/L Final  . Co2 05/18/2014 30  21-32 mmol/L Final  . Calcium, Total 05/18/2014 8.8  8.5-10.1 mg/dL Final  . Osmolality 05/18/2014 296  275-301 Final  . Anion Gap 05/18/2014 8  7-16 Final  . EGFR (African American) 05/18/2014 >60  >8m/min Final  . EGFR (Non-African Amer.) 05/18/2014 56* >640mmin Final   Comment: eGFR values <6052min/1.73 m2 may be an indication of chronic kidney disease (CKD). Calculated eGFR, using the MRDR Study equation, is useful in  patients with stable renal function. The eGFR calculation will not be reliable in acutely ill patients when serum creatinine is changing rapidly. It is not useful in patients on dialysis. The eGFR calculation may not be applicable to patients at the low and high extremes of body sizes, pregnant women, and vegetarians.   . WBC 05/19/2014 15.6* 3.8-10.6 x10 3/mm 3 Final  . RBC 05/19/2014 4.81  4.40-5.90 x10 6/mm 3 Final  . HGB 05/19/2014 13.3  13.0-18.0 g/dL Final  . HCT 05/19/2014 40.9  40.0-52.0 % Final  . MCV 05/19/2014 85  80-100 fL Final  . MCH 05/19/2014 27.6  26.0-34.0 pg Final  . MCHC 05/19/2014 32.5  32.0-36.0 g/dL Final  . RDW 05/19/2014 15.3* 11.5-14.5 % Final  . Platelet 05/19/2014 154  150-440 x10 3/mm 3 Final  . Neutrophil % 05/19/2014 91.6   Final  . Lymphocyte % 05/19/2014 5.7   Final  . Monocyte % 05/19/2014 2.6   Final  . Eosinophil % 05/19/2014 0.0   Final  . Basophil % 05/19/2014 0.1   Final  . Neutrophil # 05/19/2014 14.3* 1.4-6.5 x10 3/mm 3 Final  . Lymphocyte # 05/19/2014 0.9* 1.0-3.6 x10 3/mm 3 Final  . Monocyte  # 05/19/2014 0.4  0.2-1.0 x10 3/mm  Final  . Eosinophil # 05/19/2014 0.0  0.0-0.7 x10 3/mm 3 Final  . Basophil # 05/19/2014 0.0  0.0-0.1 x10 3/mm 3 Final  . Glucose 05/19/2014 134* 65-99 mg/dL Final  . BUN 05/19/2014 29* 7-18 mg/dL Final  . Creatinine 05/19/2014 1.18  0.60-1.30 mg/dL Final  . Sodium 05/19/2014 139  136-145 mmol/L Final  . Potassium 05/19/2014 4.4  3.5-5.1 mmol/L Final  . Chloride 05/19/2014 105  98-107 mmol/L Final  . Co2 05/19/2014 30  21-32 mmol/L Final  . Calcium, Total 05/19/2014 8.3* 8.5-10.1 mg/dL Final  . Osmolality 05/19/2014 285  275-301 Final  . Anion Gap 05/19/2014 4* 7-16 Final  . EGFR (African American) 05/19/2014 >60  >57m4mn Final  . EGFR (Non-African Amer.) 05/19/2014 >60  >57mL2m Final   Comment: eGFR values <57mL/65m1.73 m2 may be an indication of chronic kidney disease (CKD). Calculated eGFR, using  the MRDR Study equation, is useful in  patients with stable renal function. The eGFR calculation will not be reliable in acutely ill patients when serum creatinine is changing rapidly. It is not useful in patients on dialysis. The eGFR calculation may not be applicable to patients at the low and high extremes of body sizes, pregnant women, and vegetarians.      STUDIES: Ct Chest W Contrast  07/27/2015  CLINICAL DATA:  Unintentional 40 pound weight loss in past 6 months. Cough chest congestion. Previous radiation therapy for left lung carcinoma. Right lung nodule. COPD. EXAM: CT CHEST WITH CONTRAST TECHNIQUE: Multidetector CT imaging of the chest was performed during intravenous contrast administration. CONTRAST:  3m OMNIPAQUE IOHEXOL 300 MG/ML  SOLN COMPARISON:  03/07/2015 and 09/19/2014 FINDINGS: Mediastinum/Lymph Nodes: No masses, pathologically enlarged lymph nodes, or other significant abnormality. Lungs/Pleura: Post radiation changes in the left perihilar region and central left upper lobe appear stable. New peripheral opacities are seen in the  left upper lobe including to ill-defined nodular areas of airspace opacity in the posterior left upper lobe measuring 1.8 x 2.5 cm on image 18 and 1.3 x 1.7 cm on image 16. Differential diagnosis includes inflammatory infectious etiologies as well as neoplasm. 10 mm spiculated nodule in the posterior right upper lobe on image 24 shows further mild increase in size, suspicious for small carcinoma. New reticulonodular opacities are also seen in the posterior right lower lobe since prior study, suspicious for inflammatory or infectious etiology. Moderate emphysema again noted.  No evidence of pleural effusion. Upper abdomen: No acute findings. Musculoskeletal: No chest wall mass or suspicious bone lesions identified. IMPRESSION: Further increase in size of 10 mm spiculated nodule in posterior right upper lobe, highly suspicious for primary bronchogenic carcinoma. New peripheral opacities in left upper lobe including 2 ill-defined nodular areas of airspace opacity in the posterior left upper lobe measuring 1.7 cm and 2.5 cm since most recent exam on 03/07/2015. Differential diagnosis includes inflammatory and infectious etiologies, as well as carcinoma. New reticulonodular opacity in posterior right lower lobe, suspicious for inflammatory or infectious etiology. No evidence of lymphadenopathy or pleural effusion. Stable emphysema and post treatment changes in left perihilar region. Electronically Signed   By: JEarle GellM.D.   On: 07/27/2015 14:55    ASSESSMENT:   Previous history of non-small cell carcinoma of lung in the patient was a severe COPD with oxygen-dependent also is diabetic. Was left upper lobe tumor O Regina Lilly's clinically stage   T2 N1 M0 tumor based on PET scan was   NOT OPERABLE AS PATIENT'S PULMONARY FUNCTIONS WERE VERY POOR    Status post radiation therapy  2, recent CT scan shows right lower lobe infiltrate as well as left upper lobe new nodule CT scan has been reviewed independently  and reviewed with the patient Patient has significant weight loss of approximately 50 pounds in last 4 months  Proceed with reevaluation with lab data PET scan Discussed the case in tumor conference after PET scan is available to consider biopsy if needed of the left upper lobe nodule Other causes of weight loss like thyroid  dysfunction, uncontrolled diabetes cannot be ruled out and will be looked into it   Evaluate patient after PET scan and other lab data is available.   No matching staging information was found for the patient.  JForest Gleason MD   08/08/2015 8:31 AM

## 2015-08-09 ENCOUNTER — Inpatient Hospital Stay: Payer: Medicare Other | Admitting: Cardiothoracic Surgery

## 2015-08-09 LAB — T4: T4, Total: 6.9 ug/dL (ref 4.5–12.0)

## 2015-08-14 ENCOUNTER — Telehealth: Payer: Self-pay | Admitting: *Deleted

## 2015-08-14 ENCOUNTER — Other Ambulatory Visit: Payer: Self-pay | Admitting: *Deleted

## 2015-08-14 DIAGNOSIS — R634 Abnormal weight loss: Secondary | ICD-10-CM

## 2015-08-14 DIAGNOSIS — C3490 Malignant neoplasm of unspecified part of unspecified bronchus or lung: Secondary | ICD-10-CM

## 2015-08-14 DIAGNOSIS — R918 Other nonspecific abnormal finding of lung field: Secondary | ICD-10-CM

## 2015-08-14 NOTE — Telephone Encounter (Signed)
Order has been corrected.  

## 2015-08-14 NOTE — Telephone Encounter (Signed)
Patient has an appointment in PET tomorrow morning @ 9:30.  Patient is for lung however order reads PET restaging full body but needs to be restaging skull base to thigh.  Please correct and have physician sign order before end of day today.  Sarah's extension [phone] if questions.

## 2015-08-14 NOTE — Telephone Encounter (Signed)
Pt is ordered for a whole body PET tomorrow for restaging of lung cancer , they only do whole body for melanoma, Asking the order be changed

## 2015-08-15 ENCOUNTER — Other Ambulatory Visit: Payer: Self-pay | Admitting: Oncology

## 2015-08-15 ENCOUNTER — Encounter
Admission: RE | Admit: 2015-08-15 | Discharge: 2015-08-15 | Disposition: A | Discharge status: Home / Self Care | Payer: Medicare Other | Source: Ambulatory Visit | Attending: Oncology | Admitting: Oncology

## 2015-08-15 DIAGNOSIS — C3412 Malignant neoplasm of upper lobe, left bronchus or lung: Secondary | ICD-10-CM

## 2015-08-15 LAB — GLUCOSE, CAPILLARY: GLUCOSE-CAPILLARY: 106 mg/dL — AB (ref 65–99)

## 2015-08-15 MED ORDER — FLUDEOXYGLUCOSE F - 18 (FDG) INJECTION
12.4900 | Freq: Once | INTRAVENOUS | Status: AC | PRN
  Administered 2015-08-15: 12.49 via INTRAVENOUS

## 2015-08-16 ENCOUNTER — Encounter: Payer: Self-pay | Admitting: Oncology

## 2015-08-16 ENCOUNTER — Inpatient Hospital Stay (HOSPITAL_BASED_OUTPATIENT_CLINIC_OR_DEPARTMENT_OTHER): Payer: Medicare Other | Admitting: Oncology

## 2015-08-16 ENCOUNTER — Inpatient Hospital Stay: Payer: Medicare Other

## 2015-08-16 VITALS — BP 114/75 | HR 106 | Temp 96.7°F | Resp 18 | Wt 174.8 lb

## 2015-08-16 DIAGNOSIS — K869 Disease of pancreas, unspecified: Secondary | ICD-10-CM

## 2015-08-16 DIAGNOSIS — R634 Abnormal weight loss: Secondary | ICD-10-CM | POA: Diagnosis not present

## 2015-08-16 DIAGNOSIS — K859 Acute pancreatitis, unspecified: Secondary | ICD-10-CM

## 2015-08-16 DIAGNOSIS — J449 Chronic obstructive pulmonary disease, unspecified: Secondary | ICD-10-CM

## 2015-08-16 DIAGNOSIS — Z87891 Personal history of nicotine dependence: Secondary | ICD-10-CM

## 2015-08-16 DIAGNOSIS — C259 Malignant neoplasm of pancreas, unspecified: Secondary | ICD-10-CM

## 2015-08-16 DIAGNOSIS — R918 Other nonspecific abnormal finding of lung field: Secondary | ICD-10-CM | POA: Diagnosis not present

## 2015-08-16 DIAGNOSIS — R531 Weakness: Secondary | ICD-10-CM

## 2015-08-16 DIAGNOSIS — C3412 Malignant neoplasm of upper lobe, left bronchus or lung: Secondary | ICD-10-CM | POA: Diagnosis not present

## 2015-08-16 DIAGNOSIS — K8689 Other specified diseases of pancreas: Secondary | ICD-10-CM

## 2015-08-16 DIAGNOSIS — J45909 Unspecified asthma, uncomplicated: Secondary | ICD-10-CM

## 2015-08-16 DIAGNOSIS — E119 Type 2 diabetes mellitus without complications: Secondary | ICD-10-CM

## 2015-08-16 DIAGNOSIS — I10 Essential (primary) hypertension: Secondary | ICD-10-CM

## 2015-08-16 DIAGNOSIS — Z9981 Dependence on supplemental oxygen: Secondary | ICD-10-CM

## 2015-08-16 DIAGNOSIS — R5383 Other fatigue: Secondary | ICD-10-CM

## 2015-08-16 LAB — AMYLASE: AMYLASE: 86 U/L (ref 28–100)

## 2015-08-16 LAB — LIPASE, BLOOD: LIPASE: 97 U/L — AB (ref 11–51)

## 2015-08-17 LAB — CANCER ANTIGEN 19-9: CA 19 9: 97 U/mL — AB (ref 0–35)

## 2015-08-19 ENCOUNTER — Encounter: Payer: Self-pay | Admitting: Oncology

## 2015-08-19 NOTE — Progress Notes (Signed)
[facility] @ [facility] [phone]  Fax:(336) [place]: 1941-04-22  MR#: [id number]  ELF#:[id number]  Patient Care Team: [name], MD as PCP - General (Internal Medicine)  CHIEF COMPLAINT:  Chief Complaint  Patient presents with  . Lung Cancer   Carcinoma of lung T1 N1 M0 tumor previously seen by radiation oncologist and radiation therapy approximately March of 2015  Significant weight loss VISIT DIAGNOSIS:     ICD-9-CM ICD-10-CM   1. Pancreatic mass 577.9 K86.9 clindamycin (CLEOCIN) 300 MG capsule     CT Abd Wo & W Cm     Amylase     Lipase, blood     Cancer antigen 19-9     Amylase     Lipase, blood     Cancer antigen 19-9  2. Acute pancreatitis, unspecified pancreatitis type 577.0 K85.9 clindamycin (CLEOCIN) 300 MG capsule     CT Abd Wo & W Cm     Amylase     Lipase, blood     Cancer antigen 19-9     Amylase     Lipase, blood     Cancer antigen 19-9  3. Malignant neoplasm of pancreas, unspecified location of malignancy (HCC) 157.9 C25.9 clindamycin (CLEOCIN) 300 MG capsule     CT Abd Wo & W Cm     Amylase     Lipase, blood     Cancer antigen 19-9     Amylase     Lipase, blood     Cancer antigen 19-9      No history exists.    No flowsheet data found.  INTERVAL HISTORY: patient is a pleasant 75 year old male oxygen dependent with long-standing history of CPD, his pulmonologist have a left upper lobe mass. CT scan showed a 3 cm mass in the left hilar region. Underwent PET/CT scan showed hypermetabolic activity in the left upper lobe mass no associated mediastinal or hilar adenopathy. No other disease outside the chest is noted. CT-guided biopsy was positive for non-small cell lung cancer. Patient has been declined from surgery by surgical oncology based on significant comorbidities with FEV1 of 40% of predicted. Patient's been on oxygen therapy for past 4-5 years. He is seen today for consideration of  radiation therapy options. He is also seeing medical oncology. He specifically denies significant cough hemoptysis or chest tightness..  Recently patient started losing weight and has lost approximately 50 pounds in last 4 months.  Appetite has been stable.  No hemoptysis no chest pain.  No diarrhea no rectal bleeding or hematuria  Patient had a repeat CT scan which revealed new left upper lobe nodule and patient was referred to me for further evaluation regarding weight loss and possibility of recurrent or progressive carcinoma of lu patient had a PET scan done to evaluate nodule.  Here for further follow-up.  On oxygen.  No epigastric discomfort and nausea no vomiting or diarrhea. REVIEW OF SYSTEMS:    general status: Patient is feeling weak and tired.  No change in a performance status.  No chills.  No fever. Significant weight loss  as mentioned above HEENT:  No evidence of stomatitis.   No  headache Lungs:  Has increasing shortness of breath on oxygen he has been on oxygen for several months   has a history COPD Cardiac: No chest pain or paroxysmal nocturnal dyspnea GI: No nausea no vomiting no diarrhea no abdominal pain Skin: No rash Lower extremity no swelling Neurological system: No  tingling.  No numbness.  No other focal signs Musculoskeletal system no bony pains    As per HPI. Otherwise, a complete review of systems is negatve.  PAST MEDICAL HISTORY: Past Medical History  Diagnosis Date  . Lung cancer ([place])   . Diabetes mellitus without complication ([place])     Expand All Collapse All   Reason for Visit: This 75 year old Male patient presents to the clinic for initial evaluation of lung cancer .   Referred by Dr. Faith Rogue.  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               HPI   patient is a pleasant 75 year old male  oxygen dependent with long-standing history of CPD, his pulmonologist have a left upper lobe mass. CT scan showed a 3 cm mass in the left hilar region. Underwent PET/CT scan showed hypermetabolic activity in the left upper lobe mass no associated mediastinal or hilar adenopathy. No other disease outside the chest is noted. CT-guided biopsy was positive for non-small cell lung cancer. Patient has been declined from surgery by surgical oncology based on significant comorbidities with FEV1 of 40% of predicted. Patient's been on oxygen therapy for past 4-5 years. He is seen today for consideration of radiation therapy options. He is also seeing medical oncology. He specifically denies significant cough hemoptysis or chest tightness.  Patient also has insulin-dependent diabetes  Past Hx:   Hypertension:   Orthopnea:   Asthma:   CPAP with O2 2 L/M at night:   O2 @ 2- 2 1/2 L/M Continuously:   Depression:   Hyperlipidemia:   PTSD:   Emphysema:   COPD:   Sleep Apnea:   Diabetes:   UPPP:   Right knee repair:   Right Linguinal hernis repair:   Past, Family and Social History:  Past Medical History positive   Cardiovascular hyperlipidemia   Respiratory asthma; COPD; oxygen dependent, emphysema, sleep apnea   Endocrine diabetes mellitus   Past Surgical History right knee repair, right herniorrhaphy repair   Past Medical History Comments posttraumatic stress disorder   Family History noncontributory   Social History positive   Social History Comments 150-pack-year smoking history quit smoking 4 years prior no EtOH abuse history   Additional Past Medical and Surgical History accompanied by his wife today   Allergies:  Ampicillin: Rash PCN: Rash Zyban: Rash        ADVANCED DIRECTIVES:   Patient does have advance healthcare directive, Patient   does not desire to  make any changes      HEALTH MAINTENANCE: Social History  Substance Use Topics  . Smoking status: Former Research scientist (life sciences)  . Smokeless tobacco: Never Used  . Alcohol Use: None    Allergies  Allergen Reactions  . Penicillin V Potassium Hives  . Ampicillin Hives    Other reaction(s): HIVES    Current Outpatient Prescriptions  Medication Sig Dispense Refill  . albuterol (PROAIR HFA) 108 (90 BASE) MCG/ACT inhaler Inhale into the lungs.    Marland Kitchen albuterol (PROVENTIL) (2.5 MG/3ML) 0.083% nebulizer solution 2.5 mg.    . aspirin 81 MG tablet Take 81 mg by mouth.    Marland Kitchen buPROPion (WELLBUTRIN) 100 MG tablet Take 100 mg by mouth.    . cetirizine (ZYRTEC) 10 MG tablet Take 10 mg by mouth.    . citalopram (CELEXA) 20 MG tablet Take 20 mg by mouth.    Marland Kitchen  clindamycin (CLEOCIN) 300 MG capsule Take by mouth.    . diltiazem (CARDIZEM CD) 180 MG 24 hr capsule Take by mouth.    . diltiazem (TIAZAC) 180 MG 24 hr capsule Take 180 mg by mouth.    . enalapril (VASOTEC) 2.5 MG tablet Take by mouth.    . fluvastatin XL (LESCOL XL) 80 MG 24 hr tablet Take 80 mg by mouth.    . furosemide (LASIX) 20 MG tablet Take 20 mg by mouth.    . hydrochlorothiazide (HYDRODIURIL) 25 MG tablet TAKE 1 TABLET BY MOUTH ONCE A DAY    . hydrOXYzine (ATARAX/VISTARIL) 10 MG tablet TAKE 1 TABLET BY MOUTH AT BEDTIME.    . insulin aspart (NOVOLOG) 100 UNIT/ML injection Three (3) times a day. Frequency:UNKNOWN   Dosage:0.0     Instructions:  Note:Dose: 100/ML    . insulin detemir (LEVEMIR) 100 UNIT/ML injection Inject 30 Units into the skin once.    . Insulin Pen Needle (B-D ULTRAFINE III SHORT PEN) 31G X 8 MM MISC USE AS DIRECTED    . magnesium oxide (MAG-OX) 400 MG tablet Take 400 mg by mouth.    Marland Kitchen omeprazole (PRILOSEC) 20 MG capsule Take 20 mg by mouth.    . pantoprazole (PROTONIX) 40 MG tablet     . PARoxetine (PAXIL) 40 MG tablet Take 40 mg by mouth.    . pravastatin (PRAVACHOL) 80 MG tablet Take 80 mg by mouth.    . sildenafil (VIAGRA)  100 MG tablet Take 100 mg by mouth.    . tamsulosin (FLOMAX) 0.4 MG CAPS capsule Take 0.4 mg by mouth.    . theophylline (THEO-24) 300 MG 24 hr capsule Take by mouth.    . tiotropium (SPIRIVA) 18 MCG inhalation capsule Place into inhaler and inhale.    . traZODone (DESYREL) 100 MG tablet Take 100 mg by mouth.     No current facility-administered medications for this visit.    OBJECTIVE: PHYSICAL EXAM: Gen. Status: Patient is alert oriented in mild respiratory distress has oxygen dependent. Lymphatic system: Supraclavicular, cervical, axillary, inguinal lymph nodes are not palpable Head exam was generally normal. There was no scleral icterus or corneal arcus. Mucous membranes were moist. Lungs: Emphysematous chest.  Rhonchi on both sides. Cardiac: Soft systolic murmur.  Normal heart sounds Abdomen: Protuberant.  No ascites.  Spleen and liver not palpable Lower extremities trace edema Skin: No rash Neurological system no localizing signs Musculoskeletal system no abnormality All other systems have been reviewed and no positive findings  Filed Vitals:   08/16/15 1342  BP: 114/75  Pulse: 106  Temp: 96.7 F (35.9 C)  Resp: 18     There is no height on file to calculate BMI.    ECOG FS:2 - Symptomatic, <50% confined to bed  LAB RESULTS:  Office Visit on 08/16/2015  Component Date Value Ref Range Status  . Amylase 08/16/2015 86  28 - 100 U/L Final  . Lipase 08/16/2015 97* 11 - 51 U/L Final  . CA 19-9 08/16/2015 97* 0 - 35 U/mL Final   Comment: (NOTE) Roche ECLIA methodology Performed At: [facility] [place], [place] [id number] [place] BD:[id number]   Hospital Outpatient Visit on 08/15/2015  Component Date Value Ref Range Status  . Glucose-Capillary 08/15/2015 106* 65 - 99 mg/dL Final     STUDIES: Ct Chest W Contrast  07/27/2015  CLINICAL DATA:  Unintentional 40 pound weight loss in past 6 months. Cough chest congestion. Previous  radiation therapy for left lung carcinoma. Right lung nodule. COPD. EXAM: CT CHEST WITH CONTRAST TECHNIQUE: Multidetector CT imaging of the chest was performed during intravenous contrast administration. CONTRAST:  53m OMNIPAQUE IOHEXOL 300 MG/ML  SOLN COMPARISON:  03/07/2015 and 09/19/2014 FINDINGS: Mediastinum/Lymph Nodes: No masses, pathologically enlarged lymph nodes, or other significant abnormality. Lungs/Pleura: Post radiation changes in the left perihilar region and central left upper lobe appear stable. New peripheral opacities are seen in the left upper lobe including to ill-defined nodular areas of airspace opacity in the posterior left upper lobe measuring 1.8 x 2.5 cm on image 18 and 1.3 x 1.7 cm on image 16. Differential diagnosis includes inflammatory infectious etiologies as well as neoplasm. 10 mm spiculated nodule in the posterior right upper lobe on image 24 shows further mild increase in size, suspicious for small carcinoma. New reticulonodular opacities are also seen in the posterior right lower lobe since prior study, suspicious for inflammatory or infectious etiology. Moderate emphysema again noted.  No evidence of pleural effusion. Upper abdomen: No acute findings. Musculoskeletal: No chest wall mass or suspicious bone lesions identified. IMPRESSION: Further increase in size of 10 mm spiculated nodule in posterior right upper lobe, highly suspicious for primary bronchogenic carcinoma. New peripheral opacities in left upper lobe including 2 ill-defined nodular areas of airspace opacity in the posterior left upper lobe measuring 1.7 cm and 2.5 cm since most recent exam on 03/07/2015. Differential diagnosis includes inflammatory and infectious etiologies, as well as carcinoma. New reticulonodular opacity in posterior right lower lobe, suspicious for inflammatory or infectious etiology. No evidence of lymphadenopathy or pleural effusion. Stable emphysema and post treatment changes in left  perihilar region. Electronically Signed   By: JEarle GellM.D.   On: 07/27/2015 14:55   Nm Pet Image Restag (ps) Skull Base To Thigh  08/15/2015  CLINICAL DATA:  Subsequent treatment strategy for left upper lobe lung cancer. Pulmonary nodules. Weight loss. EXAM: NUCLEAR MEDICINE PET SKULL BASE TO THIGH TECHNIQUE: 12.5 mCi F-18 FDG was injected intravenously. Full-ring PET imaging was performed from the skull base to thigh after the radiotracer. CT data was obtained and used for attenuation correction and anatomic localization. FASTING BLOOD GLUCOSE:  Value: 106 mg/dl COMPARISON:  Chest CT of 07/27/2015. Abdominal pelvic CT of 03/27/2015. Most recent PET of 07/18/2013. FINDINGS: NECK No areas of abnormal hypermetabolism. CHEST Hypermetabolism which corresponds to the spiculated right upper lobe pulmonary nodule. This measures 10 mm and a S.U.V. max of 2.5 on image 99/series 3. More vague hypermetabolism corresponding to posterior and lateral left upper lobe pleural-based opacities. Example laterally at 2.0 x 1.8 cm and a S.U.V. max of 2.6 on image 98/series 3. This may be slightly decreased since 07/27/2015. Difference could alternatively be due to differences in slice selection and nondedicated technique today. Vague hypermetabolism corresponding to presumed radiation change within the left perihilar lung. No focal abnormality to suggest recurrent or residual disease in this area. No thoracic nodal hypermetabolism. ABDOMEN/PELVIS A focus of hypermetabolism about the posterior aspect of the descending colon likely corresponds to soft tissue thickening in this area. This measures on the order of 1.3 x 1.5 cm and a S.U.V. max of 11.4 on image 170/series 3. No abdominal pelvic nodal hypermetabolism. Soft tissue fullness and ill definition of fat planes about the uncinate process of the pancreas and transverse duodenum, corresponding to hypermetabolism. This measures a S.U.V. max of 6.3 on image 184/series 3. SKELETON  No abnormal marrow activity. CT IMAGES PERFORMED FOR ATTENUATION CORRECTION  No cervical adenopathy. Bilateral carotid atherosclerosis. Mild cardiomegaly with LAD and left circumflex coronary artery atherosclerosis. Centrilobular emphysema. Probablea sebaceous cyst about the right paraspinous region at 1.6 cm. Mild hepatic steatosis. Too small to characterize interpolar right renal lesion is most likely a cyst. Advanced abdominal aortic atherosclerosis. Mild prostatomegaly. Fat containing left inguinal hernia. IMPRESSION: 1. Left perihilar radiation change with low-level hypermetabolism. No evidence of locally recurrent disease. 2. Hypermetabolism corresponding to a spiculated 1.0 cm right upper lobe pulmonary nodule. Suspicious for metachronous primary bronchogenic carcinoma. Isolated metastasis felt less likely. 3. Areas of left upper lobe pleural-based hypermetabolic opacity are favored to be infectious or inflammatory, given morphology. At least 1 of these may have minimally decreased since the prior exam. These can be re-evaluated on follow-up CT. 4. Hypermetabolism about the uncinate process of the pancreas and transverse duodenum. Corresponding soft tissue fullness and suggestion of surrounding edema. Suspect pancreatitis. Correlate with pancreatic enzymes. Consider dedicated imaging with pancreatic protocol CT to exclude underlying mass. If the pancreatic enzymes are elevated, repeat imaging could be withheld until the patient is clinically improved. 5. Descending colonic hypermetabolism with suggestion of an underlying soft tissue polyp. Cannot exclude neoplasia. Colonoscopy recommended, if not recently performed (a virtual diagnostic CT of 03/27/2015 was non diagnostic). These results will be called to the ordering clinician or representative by the Radiologist Assistant, and communication documented in the PACS or zVision Dashboard. Electronically Signed   By: Abigail Miyamoto M.D.   On: 08/15/2015 14:50     ASSESSMENT:   Previous history of non-small cell carcinoma of lung in the patient was a severe COPD with oxygen-dependent also is diabetic. Was left upper lobe tumor O Regina Lilly's clinically stage   T2 N1 M0 tumor based on PET scan was   NOT OPERABLE AS PATIENT'S PULMONARY FUNCTIONS WERE VERY POOR    Status post radiation therapy PET scan has been reviewed.  It shows increased uptake in the right upper lobe nodule which is 1 cm in either probably second primary lung cancer versus metastases. Also has increased uptake in pancreatic mass. CA 19-9 is 97 Amylase is 86 Lipase  is high is 97 PLAN 1.  Right upper lobe nodule is either secondary primary or metastases.  We will review these x-rays in the tumor conference 2.  Pancreatic mass versus pancreatitis needs to be evaluated with MRI scan and will reevaluate patient after her MRI scan has been done PET scan has been reviewed independently and reviewed with the patient. Amylase is normal lipase is slightly elevated CA 19-9 is elevated.  Patient does not have any symptoms of pancreatitis MRI scan has been ordered  No matching staging information was found for the patient.  Forest Gleason, MD   08/19/2015 11:04 AM

## 2015-08-27 ENCOUNTER — Inpatient Hospital Stay
Admission: RE | Admit: 2015-08-27 | Discharge: 2015-08-27 | Disposition: A | Discharge status: Home / Self Care | Payer: Medicare Other | Source: Ambulatory Visit | Attending: Radiation Oncology | Admitting: Radiation Oncology

## 2015-08-27 ENCOUNTER — Ambulatory Visit
Admission: RE | Admit: 2015-08-27 | Discharge: 2015-08-27 | Disposition: A | Discharge status: Home / Self Care | Payer: Medicare Other | Source: Ambulatory Visit | Attending: Oncology | Admitting: Oncology

## 2015-08-27 ENCOUNTER — Ambulatory Visit: Payer: Medicare Other | Admitting: Radiation Oncology

## 2015-08-27 DIAGNOSIS — K869 Disease of pancreas, unspecified: Secondary | ICD-10-CM | POA: Insufficient documentation

## 2015-08-27 DIAGNOSIS — C259 Malignant neoplasm of pancreas, unspecified: Secondary | ICD-10-CM | POA: Diagnosis present

## 2015-08-27 DIAGNOSIS — K859 Acute pancreatitis, unspecified: Secondary | ICD-10-CM

## 2015-08-27 DIAGNOSIS — R935 Abnormal findings on diagnostic imaging of other abdominal regions, including retroperitoneum: Secondary | ICD-10-CM | POA: Insufficient documentation

## 2015-08-27 DIAGNOSIS — K8689 Other specified diseases of pancreas: Secondary | ICD-10-CM

## 2015-08-27 MED ORDER — IOHEXOL 300 MG/ML  SOLN
100.0000 mL | Freq: Once | INTRAMUSCULAR | Status: AC | PRN
  Administered 2015-08-27: 100 mL via INTRAVENOUS

## 2015-08-28 ENCOUNTER — Encounter: Payer: Self-pay | Admitting: Oncology

## 2015-08-28 ENCOUNTER — Inpatient Hospital Stay (HOSPITAL_BASED_OUTPATIENT_CLINIC_OR_DEPARTMENT_OTHER): Payer: Medicare Other | Admitting: Oncology

## 2015-08-28 ENCOUNTER — Telehealth: Payer: Self-pay

## 2015-08-28 VITALS — BP 113/76 | HR 111 | Temp 96.1°F | Resp 18 | Wt 173.7 lb

## 2015-08-28 DIAGNOSIS — C3412 Malignant neoplasm of upper lobe, left bronchus or lung: Secondary | ICD-10-CM | POA: Diagnosis not present

## 2015-08-28 DIAGNOSIS — R634 Abnormal weight loss: Secondary | ICD-10-CM

## 2015-08-28 DIAGNOSIS — E119 Type 2 diabetes mellitus without complications: Secondary | ICD-10-CM

## 2015-08-28 DIAGNOSIS — R5383 Other fatigue: Secondary | ICD-10-CM

## 2015-08-28 DIAGNOSIS — Z794 Long term (current) use of insulin: Secondary | ICD-10-CM

## 2015-08-28 DIAGNOSIS — Z87891 Personal history of nicotine dependence: Secondary | ICD-10-CM

## 2015-08-28 DIAGNOSIS — R531 Weakness: Secondary | ICD-10-CM

## 2015-08-28 DIAGNOSIS — R918 Other nonspecific abnormal finding of lung field: Secondary | ICD-10-CM | POA: Diagnosis not present

## 2015-08-28 DIAGNOSIS — Z923 Personal history of irradiation: Secondary | ICD-10-CM

## 2015-08-28 DIAGNOSIS — J45909 Unspecified asthma, uncomplicated: Secondary | ICD-10-CM

## 2015-08-28 DIAGNOSIS — K8689 Other specified diseases of pancreas: Secondary | ICD-10-CM

## 2015-08-28 DIAGNOSIS — J449 Chronic obstructive pulmonary disease, unspecified: Secondary | ICD-10-CM

## 2015-08-28 DIAGNOSIS — C259 Malignant neoplasm of pancreas, unspecified: Secondary | ICD-10-CM

## 2015-08-28 DIAGNOSIS — Z9981 Dependence on supplemental oxygen: Secondary | ICD-10-CM

## 2015-08-28 DIAGNOSIS — K869 Disease of pancreas, unspecified: Secondary | ICD-10-CM | POA: Diagnosis not present

## 2015-08-28 NOTE — Telephone Encounter (Signed)
  Oncology Nurse Navigator Documentation  Navigator Location: [facility] (08/28/15 1500) Navigator Encounter Type: Telephone (08/28/15 1500) Telephone: Outgoing Call (08/28/15 1500)                 Interventions: Coordination of Care (08/28/15 1500)   Coordination of Care: EUS (08/28/15 1500)        Acuity: Level 2 (08/28/15 1500)   Acuity Level 2: Initial guidance, education and coordination as needed;Educational needs;Assistance expediting appointments;Ongoing guidance and education throughout treatment as needed (08/28/15 1500)     Time Spent with Patient: 30 (08/28/15 1500)   Referral received from Dr Oliva Bustard for EUS for possible pancreatic mass. March 16th first available at [facility]. Pt offered and refused appt at [facility]. Went over instructions for EUS and copy mailed to home address. Provided my contact information for any further questions or concerns. Instructed to hold asa for 5 days if taking for noncardiac reasons.  INSTRUCTIONS FOR ENDOSCOPIC ULTRASOUND -Your procedure has been scheduled for March 16th with Dr Earlie Counts at [facility] will contact you to pre-register over the phone. If for any reason you have not received a call within one week prior to your scheduled procedure date, please call [phone]. -To get your scheduled arrival time, please call the Endoscopy unit at  [phone] between 1-3pm on: March 15th   -ON THE DAY OF YOU PROCEDURE:   1. If you are scheduled for a morning procedure, nothing to drink after midnight  -If you are scheduled for an afternoon procedure, you may have clear liquids until 5 hours prior  to the procedure but no carbonated drinks or broth  2. NO FOOD THE DAY OF YOUR PROCEDURE  3. You may take your heart, seizure, blood pressure, Parkinson's or breathing medications at  6am with just enough water to get your pills down  4. Do not take any oral Diabetic medications the morning of your procedure.  5. If you are a diabetic  and are using insulin, please notify your prescribing physician of this  procedure as your dose may need to be altered related to not being able to eat or drink.   5. Do not take Vitamins     -On the day of your procedure, come to the [facility] Admitting/Registration desk (First desk on the right) at the scheduled arrival time. You MUST have someone drive you home from your procedure. You must have a responsible adult with a valid drivers license who is on site throughout your entire procedure and who can stay with you for several hours after your procedure. You may not go home alone in a taxi, shuttle [place] or bus, as the drivers will not be responsible for you.

## 2015-08-28 NOTE — Progress Notes (Signed)
[place] @ [facility] [phone]  Fax:([phone]) [place]: 1940/10/12  MR#: [id number]  NID#:[id number]  Patient Care Team: [name], MD as PCP - General (Internal Medicine)  CHIEF COMPLAINT:  Chief Complaint  Patient presents with  . Pancreatic mass   Carcinoma of lung T1 N1 M0 tumor previously seen by radiation oncologist and radiation therapy approximately March of 2015  Significant weight loss VISIT DIAGNOSIS:   No diagnosis found.    No history exists.    No flowsheet data found.  INTERVAL HISTORY: patient is a pleasant 75 year old male oxygen dependent with long-standing history of CPD, his pulmonologist have a left upper lobe mass. CT scan showed a 3 cm mass in the left hilar region. Underwent PET/CT scan showed hypermetabolic activity in the left upper lobe mass no associated mediastinal or hilar adenopathy. No other disease outside the chest is noted. CT-guided biopsy was positive for non-small cell lung cancer. Patient has been declined from surgery by surgical oncology based on significant comorbidities with FEV1 of 40% of predicted. Patient's been on oxygen therapy for past 4-5 years. He is seen today for consideration of radiation therapy options. He is also seeing medical oncology. He specifically denies significant cough hemoptysis or chest tightness..  Recently patient started losing weight and has lost approximately 50 pounds in last 4 months.  Appetite has been stable.  No hemoptysis no chest pain.  No diarrhea no rectal bleeding or hematuria  Patient had a repeat CT scan which revealed new left upper lobe nodule and patient was referred to me for further evaluation regarding weight loss and possibility of recurrent or progressive carcinoma of lu patient had a PET scan done to evaluate nodule.  Here for further follow-up.  On oxygen.  No epigastric discomfort and nausea no vomiting or diarrhea. REVIEW OF  SYSTEMS:    general status: Patient is feeling weak and tired.  No change in a performance status.  No chills.  No fever. Significant weight loss  as mentioned above HEENT:  No evidence of stomatitis.   No  headache Lungs:  Has increasing shortness of breath on oxygen he has been on oxygen for several months   has a history COPD Cardiac: No chest pain or paroxysmal nocturnal dyspnea GI: No nausea no vomiting no diarrhea no abdominal pain Skin: No rash Lower extremity no swelling Neurological system: No tingling.  No numbness.  No other focal signs Musculoskeletal system no bony pains    As per HPI. Otherwise, a complete review of systems is negatve.  PAST MEDICAL HISTORY: Past Medical History  Diagnosis Date  . Lung cancer ([place])   . Diabetes mellitus without complication ([place])     Expand All Collapse All   Reason for Visit: This 75 year old Male patient presents to the clinic for initial evaluation of lung cancer .   Referred by Dr. Faith Rogue.  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               HPI   patient is a pleasant 75 year old male oxygen dependent with long-standing history of CPD, his pulmonologist have a left upper lobe mass. CT scan showed a 3 cm mass in the left hilar region. Underwent PET/CT scan showed hypermetabolic activity in the left upper lobe mass no associated mediastinal or hilar adenopathy. No other disease  outside the chest is noted. CT-guided biopsy was positive for non-small cell lung cancer. Patient has been declined from surgery by surgical oncology based on significant comorbidities with FEV1 of 40% of predicted. Patient's been on oxygen therapy for past 4-5 years. He is seen today for consideration of radiation therapy options. He is also seeing medical oncology. He specifically denies significant cough  hemoptysis or chest tightness.  Patient is here for further follow-up after getting imaging done for the pancreatic mass versus pancreatitis.  Patient never had any pain nausea vomiting suggesting acute pancreatitis.  Repeat CT scan has been reviewed independently.  Patient also has insulin-dependent diabetes  Past Hx:   Hypertension:   Orthopnea:   Asthma:   CPAP with O2 2 L/M at night:   O2 @ 2- 2 1/2 L/M Continuously:   Depression:   Hyperlipidemia:   PTSD:   Emphysema:   COPD:   Sleep Apnea:   Diabetes:   UPPP:   Right knee repair:   Right Linguinal hernis repair:   Past, Family and Social History:  Past Medical History positive   Cardiovascular hyperlipidemia   Respiratory asthma; COPD; oxygen dependent, emphysema, sleep apnea   Endocrine diabetes mellitus   Past Surgical History right knee repair, right herniorrhaphy repair   Past Medical History Comments posttraumatic stress disorder   Family History noncontributory   Social History positive   Social History Comments 150-pack-year smoking history quit smoking 4 years prior no EtOH abuse history   Additional Past Medical and Surgical History accompanied by his wife today   Allergies:  Ampicillin: Rash PCN: Rash Zyban: Rash        ADVANCED DIRECTIVES:   Patient does have advance healthcare directive, Patient   does not desire to make any changes      HEALTH MAINTENANCE: Social History  Substance Use Topics  . Smoking status: Former Research scientist (life sciences)  . Smokeless tobacco: Never Used  . Alcohol Use: None    Allergies  Allergen Reactions  . Penicillin V Potassium Hives  . Ampicillin Hives    Other reaction(s): HIVES    Current Outpatient Prescriptions  Medication Sig Dispense Refill  . albuterol (PROAIR HFA) 108 (90 BASE) MCG/ACT inhaler Inhale into the lungs.    Marland Kitchen albuterol  (PROVENTIL) (2.5 MG/3ML) 0.083% nebulizer solution 2.5 mg.    . aspirin 81 MG tablet Take 81 mg by mouth.    Marland Kitchen buPROPion (WELLBUTRIN) 100 MG tablet Take 100 mg by mouth.    . cetirizine (ZYRTEC) 10 MG tablet Take 10 mg by mouth.    . citalopram (CELEXA) 20 MG tablet Take 20 mg by mouth.    . diltiazem (CARDIZEM CD) 180 MG 24 hr capsule Take by mouth.    . diltiazem (TIAZAC) 180 MG 24 hr capsule Take 180 mg by mouth.    . enalapril (VASOTEC) 2.5 MG tablet Take by mouth.    . fluvastatin XL (LESCOL XL) 80 MG 24 hr tablet Take 80 mg by mouth.    . furosemide (LASIX) 20 MG tablet Take 20 mg by mouth.    . hydrochlorothiazide (HYDRODIURIL) 25 MG tablet TAKE 1 TABLET BY MOUTH ONCE A DAY    . hydrOXYzine (ATARAX/VISTARIL) 10 MG tablet TAKE 1 TABLET BY MOUTH AT BEDTIME.    . insulin aspart (NOVOLOG) 100 UNIT/ML injection Three (3) times a day. Frequency:UNKNOWN   Dosage:0.0     Instructions:  Note:Dose: 100/ML    . insulin detemir (LEVEMIR) 100 UNIT/ML injection Inject 30 Units into  the skin once.    . Insulin Pen Needle (B-D ULTRAFINE III SHORT PEN) 31G X 8 MM MISC USE AS DIRECTED    . magnesium oxide (MAG-OX) 400 MG tablet Take 400 mg by mouth.    Marland Kitchen omeprazole (PRILOSEC) 20 MG capsule Take 20 mg by mouth.    . pantoprazole (PROTONIX) 40 MG tablet     . PARoxetine (PAXIL) 40 MG tablet Take 40 mg by mouth.    . pravastatin (PRAVACHOL) 80 MG tablet Take 80 mg by mouth.    . sildenafil (VIAGRA) 100 MG tablet Take 100 mg by mouth.    . tamsulosin (FLOMAX) 0.4 MG CAPS capsule Take 0.4 mg by mouth.    . theophylline (THEO-24) 300 MG 24 hr capsule Take by mouth.    . tiotropium (SPIRIVA) 18 MCG inhalation capsule Place into inhaler and inhale.    . traZODone (DESYREL) 100 MG tablet Take 100 mg by mouth.     No current facility-administered medications for this visit.    OBJECTIVE: PHYSICAL EXAM: Gen. Status: Patient is alert oriented in mild respiratory distress has oxygen dependent. Lymphatic  system: Supraclavicular, cervical, axillary, inguinal lymph nodes are not palpable Head exam was generally normal. There was no scleral icterus or corneal arcus. Mucous membranes were moist. Lungs: Emphysematous chest.  Rhonchi on both sides. Cardiac: Soft systolic murmur.  Normal heart sounds Abdomen: Protuberant.  No ascites.  Spleen and liver not palpable Lower extremities trace edema Skin: No rash Neurological system no localizing signs Musculoskeletal system no abnormality All other systems have been reviewed and no positive findings  Filed Vitals:   08/28/15 1115  BP: 113/76  Pulse: 111  Temp: 96.1 F (35.6 C)  Resp: 18     There is no height on file to calculate BMI.    ECOG FS:2 - Symptomatic, <50% confined to bed  LAB RESULTS:  No visits with results within 5 Day(s) from this visit. Latest known visit with results is:  Office Visit on 08/16/2015  Component Date Value Ref Range Status  . Amylase 08/16/2015 86  28 - 100 U/L Final  . Lipase 08/16/2015 97* 11 - 51 U/L Final  . CA 19-9 08/16/2015 97* 0 - 35 U/mL Final   Comment: (NOTE) Roche ECLIA methodology Performed At: [facility] [place], [place] [id number] Lindon Romp MD KN:[id number]      STUDIES: Nm Pet Image Restag (ps) Skull Base To Thigh  08/15/2015  CLINICAL DATA:  Subsequent treatment strategy for left upper lobe lung cancer. Pulmonary nodules. Weight loss. EXAM: NUCLEAR MEDICINE PET SKULL BASE TO THIGH TECHNIQUE: 12.5 mCi F-18 FDG was injected intravenously. Full-ring PET imaging was performed from the skull base to thigh after the radiotracer. CT data was obtained and used for attenuation correction and anatomic localization. FASTING BLOOD GLUCOSE:  Value: 106 mg/dl COMPARISON:  Chest CT of 07/27/2015. Abdominal pelvic CT of 03/27/2015. Most recent PET of 07/18/2013. FINDINGS: NECK No areas of abnormal hypermetabolism. CHEST Hypermetabolism which corresponds to the spiculated  right upper lobe pulmonary nodule. This measures 10 mm and a S.U.V. max of 2.5 on image 99/series 3. More vague hypermetabolism corresponding to posterior and lateral left upper lobe pleural-based opacities. Example laterally at 2.0 x 1.8 cm and a S.U.V. max of 2.6 on image 98/series 3. This may be slightly decreased since 07/27/2015. Difference could alternatively be due to differences in slice selection and nondedicated technique today. Vague hypermetabolism corresponding to presumed radiation change within the  left perihilar lung. No focal abnormality to suggest recurrent or residual disease in this area. No thoracic nodal hypermetabolism. ABDOMEN/PELVIS A focus of hypermetabolism about the posterior aspect of the descending colon likely corresponds to soft tissue thickening in this area. This measures on the order of 1.3 x 1.5 cm and a S.U.V. max of 11.4 on image 170/series 3. No abdominal pelvic nodal hypermetabolism. Soft tissue fullness and ill definition of fat planes about the uncinate process of the pancreas and transverse duodenum, corresponding to hypermetabolism. This measures a S.U.V. max of 6.3 on image 184/series 3. SKELETON No abnormal marrow activity. CT IMAGES PERFORMED FOR ATTENUATION CORRECTION No cervical adenopathy. Bilateral carotid atherosclerosis. Mild cardiomegaly with LAD and left circumflex coronary artery atherosclerosis. Centrilobular emphysema. Probablea sebaceous cyst about the right paraspinous region at 1.6 cm. Mild hepatic steatosis. Too small to characterize interpolar right renal lesion is most likely a cyst. Advanced abdominal aortic atherosclerosis. Mild prostatomegaly. Fat containing left inguinal hernia. IMPRESSION: 1. Left perihilar radiation change with low-level hypermetabolism. No evidence of locally recurrent disease. 2. Hypermetabolism corresponding to a spiculated 1.0 cm right upper lobe pulmonary nodule. Suspicious for metachronous primary bronchogenic carcinoma.  Isolated metastasis felt less likely. 3. Areas of left upper lobe pleural-based hypermetabolic opacity are favored to be infectious or inflammatory, given morphology. At least 1 of these may have minimally decreased since the prior exam. These can be re-evaluated on follow-up CT. 4. Hypermetabolism about the uncinate process of the pancreas and transverse duodenum. Corresponding soft tissue fullness and suggestion of surrounding edema. Suspect pancreatitis. Correlate with pancreatic enzymes. Consider dedicated imaging with pancreatic protocol CT to exclude underlying mass. If the pancreatic enzymes are elevated, repeat imaging could be withheld until the patient is clinically improved. 5. Descending colonic hypermetabolism with suggestion of an underlying soft tissue polyp. Cannot exclude neoplasia. Colonoscopy recommended, if not recently performed (a virtual diagnostic CT of 03/27/2015 was non diagnostic). These results will be called to the ordering clinician or representative by the Radiologist Assistant, and communication documented in the PACS or zVision Dashboard. Electronically Signed   By: Abigail Miyamoto M.D.   On: 08/15/2015 14:50   Ct Abd Wo & W Cm  08/27/2015  CLINICAL DATA:  Weight loss, non-small cell lung cancer with active disease on CT, elevated lipase with hypermetabolism along the uncinate process, evaluate for focal lesion EXAM: CT ABDOMEN WITHOUT AND WITH CONTRAST TECHNIQUE: Multidetector CT imaging of the abdomen was performed following the standard protocol before and following the bolus administration of intravenous contrast. CONTRAST:  138m OMNIPAQUE IOHEXOL 300 MG/ML  SOLN COMPARISON:  PET-CT 08/15/2015 FINDINGS: Lower chest:  Lingular scarring, better evaluated on recent PET CT. Hepatobiliary: Liver is within normal limits. No suspicious/enhancing hepatic lesions. Gallbladder is within normal limits. No intrahepatic or extrahepatic ductal dilatation. Pancreas: Peripancreatic inflammation  has improved from recent PET-CT. However, there is mild soft tissue prominence in the region of the uncinate process, measuring approximately 1.9 x 2.8 cm (series 12/image 20). No associated pancreatic ductal dilatation or atrophy. Spleen: Within normal limits. Adrenals/Urinary Tract: Mild thickening of the bilateral adrenal glands, without discrete mass. Small right renal cysts measuring up to 11 mm in the posterior right lower kidney (series 5/ image 48). Left kidney is within normal limits. No hydronephrosis. Stomach/Bowel: Stomach is within normal limits. Visualized bowel is unremarkable. Vascular/Lymphatic: Atherosclerotic calcifications of the abdominal aorta and branch vessels. No evidence of abdominal aortic aneurysm. No suspicious abdominal lymphadenopathy. Other: No abdominal ascites. Musculoskeletal: No focal osseous lesions.  IMPRESSION: Mild soft tissue prominence in the region the uncinate process, with resolution of prior peripancreatic inflammation. This appearance may reflect residual focal pancreatitis, although underlying neoplasm is not entirely excluded. Consider follow-up MRI abdomen with/without contrast in 4 weeks. Electronically Signed   By: Julian Hy M.D.   On: 08/27/2015 17:19    ASSESSMENT:   Previous history of non-small cell carcinoma of lung in the patient was a severe COPD with oxygen-dependent also is diabetic. Was left upper lobe tumor O Regina Lilly's clinically stage   T2 N1 M0 tumor based on PET scan was   NOT OPERABLE AS PATIENT'S PULMONARY FUNCTIONS WERE VERY POOR    Status post radiation therapy PET scan has been reviewed.  It shows increased uptake in the right upper lobe nodule which is 1 cm in either probably second primary lung cancer versus metastases. Also has increased uptake in pancreatic mass. CA 19-9 is 97 Amylase is 86 Lipase  is high is 97 PLAN  CT scan has been reviewed independently.  There are 2 questions to be resolved over.  Of  time  1.  There is spiculated right upper lobe mass 1 cm in origin which is hypermetabolic suggesting second primary cancer of lung versus metastases.  His area of left upper lobe pleural-based hypermetabolic activity favored to be infectious or inflammatory in nature. Third.  Area of pancreatic uncinate process mass and surrounding inflammation with normal amylase and lipase slightly elevated CA 19-9 ordered repeat CT scan decrease inflammation around the uncinate process but abnormality in uncinate process persist which needs further evaluation either with repeat scan in 4-6 weeks or with EUS   1.  Right upper lobe nodule is either secondary primary or metastases.  We will review these x-rays in the tumor conference 2.  Pancreatic mass versus pancreatitis needs to be evaluated with MRI scan and will reevaluate patient after her MRI scan has been done PET scan has been reviewed independently and reviewed with the patient. Amylase is normal lipase is slightly elevated CA 19-9 is elevated.  Patient does not have any symptoms of pancreatitis All this has been discussed in detail with the patient.  Proceed with EUS and reevaluate patient in 2-3 weeks  All the scans have been reviewed independently and reviewed with the patient. Duration of visit is 25 minutes and 50% of time was spent discussing VARIOUS  options or   And cordinating care with  other physicians social worker No matching staging information was found for the patient.  Forest Gleason, MD   08/28/2015 12:06 PM

## 2015-08-28 NOTE — Progress Notes (Signed)
Patient here today for CT results. 

## 2015-08-29 ENCOUNTER — Other Ambulatory Visit: Payer: Self-pay | Admitting: Family Medicine

## 2015-08-29 ENCOUNTER — Other Ambulatory Visit: Payer: Self-pay

## 2015-08-29 DIAGNOSIS — K8689 Other specified diseases of pancreas: Secondary | ICD-10-CM

## 2015-09-04 ENCOUNTER — Ambulatory Visit: Payer: Medicare Other

## 2015-09-04 ENCOUNTER — Other Ambulatory Visit: Payer: Self-pay

## 2015-09-07 ENCOUNTER — Emergency Department: Payer: Medicare Other

## 2015-09-07 ENCOUNTER — Inpatient Hospital Stay
Admission: EM | Admit: 2015-09-07 | Discharge: 2015-09-09 | DRG: 683 | Disposition: A | Discharge status: Home / Self Care | Payer: Medicare Other | Attending: Internal Medicine | Admitting: Internal Medicine

## 2015-09-07 ENCOUNTER — Encounter: Payer: Self-pay | Admitting: Emergency Medicine

## 2015-09-07 ENCOUNTER — Inpatient Hospital Stay: Payer: Medicare Other

## 2015-09-07 DIAGNOSIS — Z8249 Family history of ischemic heart disease and other diseases of the circulatory system: Secondary | ICD-10-CM

## 2015-09-07 DIAGNOSIS — J449 Chronic obstructive pulmonary disease, unspecified: Secondary | ICD-10-CM

## 2015-09-07 DIAGNOSIS — J9610 Chronic respiratory failure, unspecified whether with hypoxia or hypercapnia: Secondary | ICD-10-CM | POA: Diagnosis present

## 2015-09-07 DIAGNOSIS — F4310 Post-traumatic stress disorder, unspecified: Secondary | ICD-10-CM | POA: Diagnosis present

## 2015-09-07 DIAGNOSIS — E1122 Type 2 diabetes mellitus with diabetic chronic kidney disease: Secondary | ICD-10-CM | POA: Diagnosis present

## 2015-09-07 DIAGNOSIS — E7800 Pure hypercholesterolemia, unspecified: Secondary | ICD-10-CM | POA: Diagnosis present

## 2015-09-07 DIAGNOSIS — N401 Enlarged prostate with lower urinary tract symptoms: Secondary | ICD-10-CM | POA: Diagnosis present

## 2015-09-07 DIAGNOSIS — Z7951 Long term (current) use of inhaled steroids: Secondary | ICD-10-CM | POA: Diagnosis not present

## 2015-09-07 DIAGNOSIS — Z79899 Other long term (current) drug therapy: Secondary | ICD-10-CM | POA: Diagnosis not present

## 2015-09-07 DIAGNOSIS — Z79891 Long term (current) use of opiate analgesic: Secondary | ICD-10-CM

## 2015-09-07 DIAGNOSIS — Z7952 Long term (current) use of systemic steroids: Secondary | ICD-10-CM

## 2015-09-07 DIAGNOSIS — Z794 Long term (current) use of insulin: Secondary | ICD-10-CM | POA: Diagnosis not present

## 2015-09-07 DIAGNOSIS — C3490 Malignant neoplasm of unspecified part of unspecified bronchus or lung: Secondary | ICD-10-CM | POA: Diagnosis present

## 2015-09-07 DIAGNOSIS — R748 Abnormal levels of other serum enzymes: Secondary | ICD-10-CM | POA: Diagnosis present

## 2015-09-07 DIAGNOSIS — Z7982 Long term (current) use of aspirin: Secondary | ICD-10-CM | POA: Diagnosis not present

## 2015-09-07 DIAGNOSIS — N183 Chronic kidney disease, stage 3 (moderate): Secondary | ICD-10-CM | POA: Diagnosis present

## 2015-09-07 DIAGNOSIS — Z9981 Dependence on supplemental oxygen: Secondary | ICD-10-CM | POA: Diagnosis not present

## 2015-09-07 DIAGNOSIS — J441 Chronic obstructive pulmonary disease with (acute) exacerbation: Secondary | ICD-10-CM | POA: Diagnosis present

## 2015-09-07 DIAGNOSIS — N139 Obstructive and reflux uropathy, unspecified: Secondary | ICD-10-CM | POA: Diagnosis present

## 2015-09-07 DIAGNOSIS — G4730 Sleep apnea, unspecified: Secondary | ICD-10-CM | POA: Diagnosis present

## 2015-09-07 DIAGNOSIS — K869 Disease of pancreas, unspecified: Secondary | ICD-10-CM | POA: Diagnosis present

## 2015-09-07 DIAGNOSIS — N179 Acute kidney failure, unspecified: Secondary | ICD-10-CM | POA: Diagnosis present

## 2015-09-07 DIAGNOSIS — E440 Moderate protein-calorie malnutrition: Secondary | ICD-10-CM | POA: Diagnosis present

## 2015-09-07 DIAGNOSIS — N289 Disorder of kidney and ureter, unspecified: Secondary | ICD-10-CM

## 2015-09-07 DIAGNOSIS — Z87891 Personal history of nicotine dependence: Secondary | ICD-10-CM | POA: Diagnosis not present

## 2015-09-07 DIAGNOSIS — Z880 Allergy status to penicillin: Secondary | ICD-10-CM

## 2015-09-07 DIAGNOSIS — I2510 Atherosclerotic heart disease of native coronary artery without angina pectoris: Secondary | ICD-10-CM | POA: Diagnosis present

## 2015-09-07 DIAGNOSIS — K219 Gastro-esophageal reflux disease without esophagitis: Secondary | ICD-10-CM | POA: Diagnosis present

## 2015-09-07 DIAGNOSIS — E860 Dehydration: Secondary | ICD-10-CM | POA: Diagnosis present

## 2015-09-07 DIAGNOSIS — I129 Hypertensive chronic kidney disease with stage 1 through stage 4 chronic kidney disease, or unspecified chronic kidney disease: Secondary | ICD-10-CM | POA: Diagnosis present

## 2015-09-07 HISTORY — DX: Other specified diseases of pancreas: K86.89

## 2015-09-07 HISTORY — DX: Chronic obstructive pulmonary disease, unspecified: J44.9

## 2015-09-07 HISTORY — DX: Gastro-esophageal reflux disease without esophagitis: K21.9

## 2015-09-07 HISTORY — DX: Post-traumatic stress disorder, unspecified: F43.10

## 2015-09-07 HISTORY — DX: Atherosclerotic heart disease of native coronary artery without angina pectoris: I25.10

## 2015-09-07 LAB — CBC
HEMATOCRIT: 38.7 % — AB (ref 40.0–52.0)
HEMOGLOBIN: 13.1 g/dL (ref 13.0–18.0)
MCH: 27.4 pg (ref 26.0–34.0)
MCHC: 33.8 g/dL (ref 32.0–36.0)
MCV: 81 fL (ref 80.0–100.0)
Platelets: 261 10*3/uL (ref 150–440)
RBC: 4.78 MIL/uL (ref 4.40–5.90)
RDW: 14.3 % (ref 11.5–14.5)
WBC: 21.1 10*3/uL — ABNORMAL HIGH (ref 3.8–10.6)

## 2015-09-07 LAB — GLUCOSE, CAPILLARY: Glucose-Capillary: 232 mg/dL — ABNORMAL HIGH (ref 65–99)

## 2015-09-07 LAB — BASIC METABOLIC PANEL
Anion gap: 17 — ABNORMAL HIGH (ref 5–15)
BUN: 73 mg/dL — AB (ref 6–20)
CHLORIDE: 84 mmol/L — AB (ref 101–111)
CO2: 31 mmol/L (ref 22–32)
CREATININE: 3.74 mg/dL — AB (ref 0.61–1.24)
Calcium: 10 mg/dL (ref 8.9–10.3)
GFR calc Af Amer: 17 mL/min — ABNORMAL LOW (ref >60)
GFR calc non Af Amer: 15 mL/min — ABNORMAL LOW (ref >60)
GLUCOSE: 183 mg/dL — AB (ref 65–99)
POTASSIUM: 3.1 mmol/L — AB (ref 3.5–5.1)
Sodium: 132 mmol/L — ABNORMAL LOW (ref 135–145)

## 2015-09-07 LAB — TROPONIN I
TROPONIN I: 0.06 ng/mL — AB (ref <0.031)
Troponin I: <0.03 ng/mL (ref <0.031)

## 2015-09-07 LAB — BRAIN NATRIURETIC PEPTIDE: B NATRIURETIC PEPTIDE 5: 56 pg/mL (ref 0.0–100.0)

## 2015-09-07 LAB — RAPID INFLUENZA A&B ANTIGENS (ARMC ONLY)
INFLUENZA A (ARMC): NEGATIVE
INFLUENZA B (ARMC): NEGATIVE

## 2015-09-07 LAB — CK: CK TOTAL: 117 U/L (ref 49–397)

## 2015-09-07 MED ORDER — POLYETHYLENE GLYCOL 3350 17 G PO PACK: 17.0000 g | PACK | Freq: Every day | ORAL | Status: DC | PRN

## 2015-09-07 MED ORDER — METHYLPREDNISOLONE SODIUM SUCC 125 MG IJ SOLR
60.0000 mg | Freq: Two times a day (BID) | INTRAMUSCULAR | Status: DC
  Administered 2015-09-07 – 2015-09-09 (×4): 60 mg via INTRAVENOUS
  Filled 2015-09-07 (×5): qty 2

## 2015-09-07 MED ORDER — SODIUM CHLORIDE 0.9 % IV BOLUS (SEPSIS)
500.0000 mL | Freq: Once | INTRAVENOUS | Status: AC
  Administered 2015-09-07: 500 mL via INTRAVENOUS

## 2015-09-07 MED ORDER — METHYLPREDNISOLONE SODIUM SUCC 125 MG IJ SOLR: 60.0000 mg | Freq: Two times a day (BID) | INTRAMUSCULAR | Status: DC

## 2015-09-07 MED ORDER — ONDANSETRON HCL 4 MG/2ML IJ SOLN: 4.0000 mg | Freq: Four times a day (QID) | INTRAMUSCULAR | Status: DC | PRN

## 2015-09-07 MED ORDER — POTASSIUM CHLORIDE IN NACL 20-0.9 MEQ/L-% IV SOLN
INTRAVENOUS | Status: DC
  Administered 2015-09-07 – 2015-09-09 (×3): via INTRAVENOUS
  Filled 2015-09-07 (×5): qty 1000

## 2015-09-07 MED ORDER — ACETAMINOPHEN 325 MG PO TABS: 650.0000 mg | ORAL_TABLET | Freq: Four times a day (QID) | ORAL | Status: DC | PRN

## 2015-09-07 MED ORDER — INSULIN ASPART 100 UNIT/ML ~~LOC~~ SOLN
0.0000 [IU] | Freq: Three times a day (TID) | SUBCUTANEOUS | Status: DC
  Administered 2015-09-08: 15 [IU] via SUBCUTANEOUS
  Administered 2015-09-08 – 2015-09-09 (×3): 3 [IU] via SUBCUTANEOUS
  Administered 2015-09-09: 5 [IU] via SUBCUTANEOUS
  Filled 2015-09-07 (×2): qty 3
  Filled 2015-09-07: qty 2
  Filled 2015-09-07: qty 3
  Filled 2015-09-07: qty 15
  Filled 2015-09-07: qty 5

## 2015-09-07 MED ORDER — BISACODYL 5 MG PO TBEC: 5.0000 mg | DELAYED_RELEASE_TABLET | Freq: Every day | ORAL | Status: DC | PRN

## 2015-09-07 MED ORDER — IPRATROPIUM-ALBUTEROL 0.5-2.5 (3) MG/3ML IN SOLN
3.0000 mL | Freq: Once | RESPIRATORY_TRACT | Status: AC
  Administered 2015-09-07: 3 mL via RESPIRATORY_TRACT
  Filled 2015-09-07: qty 3

## 2015-09-07 MED ORDER — HEPARIN SODIUM (PORCINE) 5000 UNIT/ML IJ SOLN: 5000.0000 [IU] | Freq: Three times a day (TID) | INTRAMUSCULAR | Status: DC

## 2015-09-07 MED ORDER — CHLORHEXIDINE GLUCONATE 0.12 % MT SOLN
15.0000 mL | Freq: Two times a day (BID) | OROMUCOSAL | Status: DC
  Administered 2015-09-07 – 2015-09-09 (×4): 15 mL via OROMUCOSAL
  Filled 2015-09-07 (×3): qty 15

## 2015-09-07 MED ORDER — HEPARIN SODIUM (PORCINE) 5000 UNIT/ML IJ SOLN
5000.0000 [IU] | Freq: Three times a day (TID) | INTRAMUSCULAR | Status: DC
  Administered 2015-09-07 – 2015-09-09 (×5): 5000 [IU] via SUBCUTANEOUS
  Filled 2015-09-07 (×5): qty 1

## 2015-09-07 MED ORDER — ACETAMINOPHEN 650 MG RE SUPP: 650.0000 mg | Freq: Four times a day (QID) | RECTAL | Status: DC | PRN

## 2015-09-07 MED ORDER — IPRATROPIUM-ALBUTEROL 0.5-2.5 (3) MG/3ML IN SOLN
3.0000 mL | RESPIRATORY_TRACT | Status: DC
  Administered 2015-09-07 – 2015-09-09 (×11): 3 mL via RESPIRATORY_TRACT
  Filled 2015-09-07 (×11): qty 3

## 2015-09-07 MED ORDER — LEVOFLOXACIN IN D5W 500 MG/100ML IV SOLN
500.0000 mg | Freq: Once | INTRAVENOUS | Status: AC
  Administered 2015-09-07: 500 mg via INTRAVENOUS
  Filled 2015-09-07: qty 100

## 2015-09-07 MED ORDER — INSULIN ASPART 100 UNIT/ML ~~LOC~~ SOLN
0.0000 [IU] | Freq: Every day | SUBCUTANEOUS | Status: DC
  Administered 2015-09-07 – 2015-09-08 (×2): 2 [IU] via SUBCUTANEOUS
  Filled 2015-09-07: qty 2

## 2015-09-07 MED ORDER — POTASSIUM CHLORIDE IN NACL 20-0.9 MEQ/L-% IV SOLN
INTRAVENOUS | Status: DC
  Filled 2015-09-07 (×2): qty 1000

## 2015-09-07 MED ORDER — ONDANSETRON HCL 4 MG PO TABS: 4.0000 mg | ORAL_TABLET | Freq: Four times a day (QID) | ORAL | Status: DC | PRN

## 2015-09-07 MED ORDER — CETYLPYRIDINIUM CHLORIDE 0.05 % MT LIQD
7.0000 mL | Freq: Two times a day (BID) | OROMUCOSAL | Status: DC
  Administered 2015-09-08 – 2015-09-09 (×3): 7 mL via OROMUCOSAL

## 2015-09-07 NOTE — ED Notes (Signed)
Pt reports cough and congestion; pt has chronic COPD, is on 6L of chronic oxygen.

## 2015-09-07 NOTE — ED Notes (Signed)
Pts oxygen dropped to 85% on 6L, pt placed on 8L at this time, EDP made aware

## 2015-09-07 NOTE — ED Notes (Signed)
Patient transported to Ultrasound 

## 2015-09-07 NOTE — ED Provider Notes (Addendum)
[facility] Emergency Department Provider Note  ____________________________________________   I have reviewed the triage vital signs and the nursing notes.   HISTORY  Chief Complaint URI    HPI Earl Clay is a 75 y.o. male with a history of nonoperable lung cancer, diabetes mellitus COPD on 6 L home oxygen states over the last 2 days he's had a productive cough and felt a little bit warm. He has had some shortness of breath which is worse than normal. Patient states thathe did not actually have a fever. He states that he called into his primary care doctor, Dr. Doy Hutching, who started him on steroids yesterday. The patient also states that due to an error he was only on 5 L of oxygen from midnight until this morning as opposed to 6. In any event, the patient presents with cough and shortness of breath over his baseline. It is worse when he walks around. He denies any leg swelling. He does take Lasix. He denies any chest pain. He denies any nausea or vomiting or diarrhea.  Past Medical History  Diagnosis Date  . Lung cancer ([place])   . Diabetes mellitus without complication ([place])   . COPD (chronic obstructive pulmonary disease) [facility] [place])     Patient Active Problem List   Diagnosis Date Noted  . Type 2 diabetes mellitus ([place]) 07/20/2015  . Type 2 diabetes mellitus with hyperglycemia ([place]) 05/16/2014  . History of cardiac catheterization 01/24/2014  . Cancer of lung ([place]) 01/24/2014  . Breathlessness on exertion 01/24/2014  . Chronic obstructive bronchitis ([place]) 11/17/2013  . CAD in native artery 11/17/2013  . Benign essential HTN 11/17/2013  . Combined fat and carbohydrate induced hyperlipemia 11/17/2013  . Hypersomnia with sleep apnea 11/17/2013  . 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 Pure hypercholesterolemia 11/17/2013  . 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 Elevated prostate specific antigen (PSA) 08/18/2012  . Disorder of bladder function  08/18/2012  . Enlarged prostate with lower urinary tract symptoms (LUTS) 08/18/2012  . Incomplete bladder emptying 08/18/2012  . Benign prostatic hyperplasia with urinary obstruction 08/18/2012  . FOM (frequency of micturition) 08/18/2012    History reviewed. No pertinent past surgical history.  Current Outpatient Rx  Name  Route  Sig  Dispense  Refill  . albuterol (PROVENTIL HFA;VENTOLIN HFA) 108 (90 Base) MCG/ACT inhaler   Inhalation   Inhale 2 puffs into the lungs every 6 (six) hours as needed for wheezing or shortness of breath.         Marland Kitchen albuterol (PROVENTIL) (2.5 MG/3ML) 0.083% nebulizer solution   Nebulization   Take 2.5 mg by nebulization every 4 (four) hours as needed for wheezing or shortness of breath.          Marland Kitchen aspirin EC 81 MG tablet   Oral   Take 81 mg by mouth daily.         . citalopram (CELEXA) 20 MG tablet   Oral   Take 20 mg by mouth daily.          Marland Kitchen doxycycline (VIBRA-TABS) 100 MG tablet   Oral   Take 100 mg by mouth 2 (two) times daily.         . enalapril (VASOTEC) 5 MG tablet   Oral   Take 5 mg by mouth daily.         . hydrochlorothiazide (HYDRODIURIL) 25 MG tablet   Oral   Take 25 mg by mouth daily.         Marland Kitchen HYDROcodone-homatropine (  HYCODAN) 5-1.5 MG/5ML syrup   Oral   Take 5 mLs by mouth every 6 (six) hours as needed for cough.         . hydrOXYzine (ATARAX/VISTARIL) 10 MG tablet   Oral   Take 10 mg by mouth at bedtime.         . pantoprazole (PROTONIX) 40 MG tablet   Oral   Take 40 mg by mouth 2 (two) times daily before a meal.          . PARoxetine (PAXIL) 40 MG tablet   Oral   Take 40 mg by mouth 2 (two) times daily.          . pravastatin (PRAVACHOL) 80 MG tablet   Oral   Take 80 mg by mouth at bedtime.          . predniSONE (DELTASONE) 10 MG tablet   Oral   Take 10-40 mg by mouth daily with breakfast. Pt is to take as a taper:    4 tablets for two days, 3 tablets for two days, 2 tablets for two  days, and 1 tablet for two days.         Marland Kitchen buPROPion (WELLBUTRIN) 100 MG tablet   Oral   Take 100 mg by mouth.         . cetirizine (ZYRTEC) 10 MG tablet   Oral   Take 10 mg by mouth.         . diltiazem (CARDIZEM CD) 180 MG 24 hr capsule   Oral   Take by mouth.         . diltiazem (TIAZAC) 180 MG 24 hr capsule   Oral   Take 180 mg by mouth.         . fluvastatin XL (LESCOL XL) 80 MG 24 hr tablet   Oral   Take 80 mg by mouth.         . furosemide (LASIX) 20 MG tablet   Oral   Take 20 mg by mouth daily.          . insulin aspart (NOVOLOG) 100 UNIT/ML injection      Three (3) times a day. Frequency:UNKNOWN   Dosage:0.0     Instructions:  Note:Dose: 100/ML         . insulin detemir (LEVEMIR) 100 UNIT/ML injection   Subcutaneous   Inject 30 Units into the skin once.         . magnesium oxide (MAG-OX) 400 MG tablet   Oral   Take 400 mg by mouth.         Marland Kitchen omeprazole (PRILOSEC) 20 MG capsule   Oral   Take 20 mg by mouth.         . sildenafil (VIAGRA) 100 MG tablet   Oral   Take 100 mg by mouth.         . tamsulosin (FLOMAX) 0.4 MG CAPS capsule   Oral   Take 0.4 mg by mouth.         . theophylline (THEO-24) 300 MG 24 hr capsule   Oral   Take by mouth.         . tiotropium (SPIRIVA) 18 MCG inhalation capsule   Inhalation   Place 18 mcg into inhaler and inhale daily.          . traZODone (DESYREL) 100 MG tablet   Oral   Take 100 mg by mouth.           Allergies Ampicillin and  Penicillins  No family history on file.  Social History Social History  Substance Use Topics  . Smoking status: Former Research scientist (life sciences)  . Smokeless tobacco: Never Used  . Alcohol Use: None    Review of Systems Constitutional: Felt subjectively warm Eyes: No visual changes. ENT: No sore throat. No stiff neck no neck pain Cardiovascular: Denies chest pain. Respiratory: See history of present illness. Gastrointestinal:   no vomiting.  No diarrhea.  No  constipation. Genitourinary: Negative for dysuria. Musculoskeletal: Negative lower extremity swelling Skin: Negative for rash. Neurological: Negative for headaches, focal weakness or numbness. 10-point ROS otherwise negative.  ____________________________________________   PHYSICAL EXAM:  VITAL SIGNS: ED Triage Vitals  Enc Vitals Group     BP 09/07/15 1539 124/57 mmHg     Pulse Rate 09/07/15 1539 106     Resp 09/07/15 1539 22     Temp 09/07/15 1539 97.6 F (36.4 C)     Temp Source 09/07/15 1539 Oral     SpO2 09/07/15 1539 97 %     Weight 09/07/15 1539 171 lb (77.565 kg)     Height 09/07/15 1539 '5\' 5"'$  (1.651 m)     Head Cir --      Peak Flow --      Pain Score --      Pain Loc --      Pain Edu? --      Excl. in [place]? --     Constitutional: Alert and oriented. Nontoxic chronically ill-appearing Eyes: Conjunctivae are normal. PERRL. EOMI. Head: Atraumatic. Nose: No congestion/rhinnorhea. Mouth/Throat: Mucous membranes are moist.  Oropharynx non-erythematous. Neck: No stridor.   Nontender with no meningismus Cardiovascular: Normal rate, regular rhythm. Grossly normal heart sounds.  Good peripheral circulation. Respiratory: Normal respiratory effort.  No retractions. Diminished and somewhat coarse but no obvious rales or rhonchi Abdominal: Soft and nontender. No distention. No guarding no rebound Back:  There is no focal tenderness or step off there is no midline tenderness there are no lesions noted. there is no CVA tenderness Musculoskeletal: No lower extremity tenderness. No joint effusions, no DVT signs strong distal pulses no edema Neurologic:  Normal speech and language. No gross focal neurologic deficits are appreciated.  Skin:  Skin is warm, dry and intact. No rash noted. Psychiatric: Mood and affect are normal. Speech and behavior are normal.  ____________________________________________   LABS (all labs ordered are listed, but only abnormal results are  displayed)  Labs Reviewed  BASIC METABOLIC PANEL - Abnormal; Notable for the following:    Sodium 132 (*)    Potassium 3.1 (*)    Chloride 84 (*)    Glucose, Bld 183 (*)    BUN 73 (*)    Creatinine, Ser 3.74 (*)    GFR calc non Af Amer 15 (*)    GFR calc Af Amer 17 (*)    Anion gap 17 (*)    All other components within normal limits  CBC - Abnormal; Notable for the following:    WBC 21.1 (*)    HCT 38.7 (*)    All other components within normal limits  CULTURE, BLOOD (ROUTINE X 2)  CULTURE, BLOOD (ROUTINE X 2)  TROPONIN I  BRAIN NATRIURETIC PEPTIDE   ____________________________________________  EKG  I personally interpreted any EKGs ordered by me or triage Sounds tachycardia rate 103, no acute ST elevation or acute ST depression wandering baseline limits exam, no acute ischemia noted ____________________________________________  RADIOLOGY  I reviewed any imaging ordered by me or  triage that were performed during my shift ____________________________________________   PROCEDURES  Procedure(s) performed: None  Critical Care performed: CRITICAL CARE Performed by: Schuyler Amor   Total critical care time: 37 minutes  Critical care time was exclusive of separately billable procedures and treating other patients.  Critical care was necessary to treat or prevent imminent or life-threatening deterioration.  Critical care was time spent personally by me on the following activities: development of treatment plan with patient and/or surrogate as well as nursing, discussions with consultants, evaluation of patient's response to treatment, examination of patient, obtaining history from patient or surrogate, ordering and performing treatments and interventions, ordering and review of laboratory studies, ordering and review of radiographic studies, pulse oximetry and re-evaluation of patient's condition.   ____________________________________________   INITIAL IMPRESSION  / ASSESSMENT AND PLAN / ED COURSE  Pertinent labs & imaging results that were available during my care of the patient were reviewed by me and considered in my medical decision making (see chart for details). Ultimate different problems today. The first is his shortness of breath and cough. Could be a COPD flare possibly viral no obvious pneumonia noted. Patient has an elevated white count, however he was recent started on steroids. I think he will require impair Miotics he is penicillin allergic I will give him Levaquin. Patient's more compelling concern today is that his kidney function is significantly worse than baseline. Unclear exactly why. He is on enalapril and Lasix I suspect this is likely medication mediated in that respect. I will give him gentle hydration. We will give him a breathing treatment. Facial or admission to the hospital ____________________________________________   FINAL CLINICAL IMPRESSION(S) / ED DIAGNOSES  Final diagnoses:  None      This chart was dictated using voice recognition software.  Despite best efforts to proofread,  errors can occur which can change meaning.     Schuyler Amor, MD 09/07/15 [place], MD 09/07/15 [phone]

## 2015-09-07 NOTE — H&P (Signed)
[facility] [place] NAME: Earl Clay    MR#:  [id number]  DATE OF BIRTH:  02-11-1941  DATE OF ADMISSION:  09/07/2015  PRIMARY CARE PHYSICIAN: SPARKS,JEFFREY D, MD   REQUESTING/REFERRING PHYSICIAN: Dr. Burlene Arnt  CHIEF COMPLAINT:   Chief Complaint  Patient presents with  . URI    HISTORY OF PRESENT ILLNESS:  Earl Clay  is a 75 y.o. male with a known history of lung cancer, diabetes, CAD, hypertension, COPD, chronic respiratory failure on 6 L suctioned plans to the hospital complaining of severe fatigue, cough of 3 days. Patient called his primary care physician Dr. Doy Hutching yesterday and was given a prescription for prednisone. He took 1 dose yesterday with no improvement present her to the emergency room. Here his saturations are stable on 6 L at more than 90% but he is visibly short of breath. Chest x-ray shows scarring and nodules which are chronic without any infiltrates or edema. Lab work showed creatinine significantly elevated from baseline 3.7. Baseline creatinine is close to 1.6. He has had normal urine output. No recent change in medications. He did have decreased oral intake. Does not use any NSAIDs.  Patient was diagnosed with a pancreatic mass and is supposed to have an biopsied through endoscopy next week.  PAST MEDICAL HISTORY:   Past Medical History  Diagnosis Date  . Lung cancer ([place])   . Diabetes mellitus without complication ([place])   . COPD (chronic obstructive pulmonary disease) ([place])   . CAD (coronary artery disease)   . GERD (gastroesophageal reflux disease)   . PTSD (post-traumatic stress disorder)   . Pancreatic mass     PAST SURGICAL HISTORY:  History reviewed. No pertinent past surgical history.  SOCIAL HISTORY:   Social History  Substance Use Topics  . Smoking status: Former Research scientist (life sciences)  . Smokeless tobacco: Never Used  . Alcohol Use: Not on file    FAMILY HISTORY:   Family History   Problem Relation Age of Onset  . Hypertension Mother   . Congestive Heart Failure Father     DRUG ALLERGIES:   Allergies  Allergen Reactions  . Ampicillin Hives and Other (See Comments)    Has patient had a PCN reaction causing immediate rash, facial/tongue/throat swelling, SOB or lightheadedness with hypotension: No Has patient had a PCN reaction causing severe rash involving mucus membranes or skin necrosis: No Has patient had a PCN reaction that required hospitalization No Has patient had a PCN reaction occurring within the last 10 years: No If all of the above answers are "NO", then may proceed with Cephalosporin use.  Marland Kitchen Penicillins Hives and Other (See Comments)    Has patient had a PCN reaction causing immediate rash, facial/tongue/throat swelling, SOB or lightheadedness with hypotension: No Has patient had a PCN reaction causing severe rash involving mucus membranes or skin necrosis: No Has patient had a PCN reaction that required hospitalization No Has patient had a PCN reaction occurring within the last 10 years: No If all of the above answers are "NO", then may proceed with Cephalosporin use.    REVIEW OF SYSTEMS:   Review of Systems  Constitutional: Positive for malaise/fatigue. Negative for fever, chills and weight loss.  HENT: Negative for hearing loss and nosebleeds.   Eyes: Negative for blurred vision, double vision and pain.  Respiratory: Positive for cough, sputum production, shortness of breath and wheezing. Negative for hemoptysis.   Cardiovascular: Negative for chest pain, palpitations, orthopnea and leg swelling.  Gastrointestinal: Negative for nausea, vomiting, abdominal pain, diarrhea and constipation.  Genitourinary: Negative for dysuria and hematuria.  Musculoskeletal: Negative for myalgias, back pain and falls.  Skin: Negative for rash.  Neurological: Positive for weakness. Negative for dizziness, tremors, sensory change, speech change, focal weakness,  seizures and headaches.  Endo/Heme/Allergies: Does not bruise/bleed easily.  Psychiatric/Behavioral: Negative for depression and memory loss. The patient is not nervous/anxious.     MEDICATIONS AT HOME:   Prior to Admission medications   Medication Sig Start Date End Date Taking? Authorizing Provider  albuterol (PROVENTIL HFA;VENTOLIN HFA) 108 (90 Base) MCG/ACT inhaler Inhale 2 puffs into the lungs every 6 (six) hours as needed for wheezing or shortness of breath.   Yes Historical Provider, MD  albuterol (PROVENTIL) (2.5 MG/3ML) 0.083% nebulizer solution Take 2.5 mg by nebulization every 4 (four) hours as needed for wheezing or shortness of breath.    Yes Historical Provider, MD  aspirin EC 81 MG tablet Take 81 mg by mouth daily.   Yes Historical Provider, MD  cetirizine (ZYRTEC) 10 MG tablet Take 10 mg by mouth daily.    Yes Historical Provider, MD  citalopram (CELEXA) 20 MG tablet Take 20 mg by mouth daily.    Yes Historical Provider, MD  diltiazem (DILACOR XR) 180 MG 24 hr capsule Take 180 mg by mouth daily.   Yes Historical Provider, MD  doxycycline (VIBRA-TABS) 100 MG tablet Take 100 mg by mouth 2 (two) times daily. 09/06/15 09/13/15 Yes Historical Provider, MD  enalapril (VASOTEC) 5 MG tablet Take 5 mg by mouth daily.   Yes Historical Provider, MD  furosemide (LASIX) 20 MG tablet Take 20 mg by mouth daily.    Yes Historical Provider, MD  hydrochlorothiazide (HYDRODIURIL) 25 MG tablet Take 25 mg by mouth daily.   Yes Historical Provider, MD  HYDROcodone-homatropine (HYCODAN) 5-1.5 MG/5ML syrup Take 5 mLs by mouth every 6 (six) hours as needed for cough.   Yes Historical Provider, MD  hydrOXYzine (ATARAX/VISTARIL) 10 MG tablet Take 10 mg by mouth at bedtime.   Yes Historical Provider, MD  insulin aspart (NOVOLOG) 100 UNIT/ML injection Inject 2-28 Units into the skin 3 (three) times daily with meals as needed for high blood sugar. Pt uses as needed per sliding scale.   Yes Historical Provider, MD   insulin detemir (LEVEMIR) 100 UNIT/ML injection Inject 20-40 Units into the skin 2 (two) times daily. Pt uses 20 units in the morning and 40 units at night.   Yes Historical Provider, MD  magnesium oxide (MAG-OX) 400 MG tablet Take 400 mg by mouth daily.    Yes Historical Provider, MD  pantoprazole (PROTONIX) 40 MG tablet Take 40 mg by mouth 2 (two) times daily before a meal.    Yes Historical Provider, MD  PARoxetine (PAXIL) 40 MG tablet Take 40 mg by mouth 2 (two) times daily.    Yes Historical Provider, MD  pravastatin (PRAVACHOL) 80 MG tablet Take 80 mg by mouth at bedtime.    Yes Historical Provider, MD  predniSONE (DELTASONE) 10 MG tablet Take 10-40 mg by mouth daily with breakfast. Pt is to take as a taper:    4 tablets for two days, 3 tablets for two days, 2 tablets for two days, and 1 tablet for two days. 09/06/15 09/14/15 Yes Historical Provider, MD  sildenafil (VIAGRA) 100 MG tablet Take 100 mg by mouth daily as needed for erectile dysfunction.    Yes Historical Provider, MD  tamsulosin (FLOMAX) 0.4 MG CAPS capsule Take  0.4 mg by mouth at bedtime.   Yes Historical Provider, MD  theophylline (THEO-24) 300 MG 24 hr capsule Take 300 mg by mouth daily.   Yes Historical Provider, MD  tiotropium (SPIRIVA) 18 MCG inhalation capsule Place 18 mcg into inhaler and inhale daily.    Yes Historical Provider, MD  tiZANidine (ZANAFLEX) 4 MG tablet Take 4 mg by mouth at bedtime.   Yes Historical Provider, MD     VITAL SIGNS:  Blood pressure 102/57, pulse 103, temperature 97.6 F (36.4 C), temperature source Oral, resp. rate 22, height '5\' 5"'$  (1.651 m), weight 77.565 kg (171 lb), SpO2 92 %.  PHYSICAL EXAMINATION:  Physical Exam  GENERAL:  75 y.o.-year-old patient lying in the bed with respiratory distress.  EYES: Pupils equal, round, reactive to light and accommodation. No scleral icterus. Extraocular muscles intact.  HEENT: Head atraumatic, normocephalic. Oropharynx and nasopharynx clear. No  oropharyngeal erythema, moist oral mucosa . Postnasal drip NECK:  Supple, no jugular venous distention. No thyroid enlargement, no tenderness.  LUNGS: Bilateral wheezing with increased work of breathing. CARDIOVASCULAR: S1, S2 normal. No murmurs, rubs, or gallops.  ABDOMEN: Soft, nontender, nondistended. Bowel sounds present. No organomegaly or mass.  EXTREMITIES: No pedal edema, cyanosis, or clubbing. + 2 pedal & radial pulses b/l.   NEUROLOGIC: Cranial nerves II through XII are intact. No focal Motor or sensory deficits appreciated b/l PSYCHIATRIC: The patient is alert and oriented x 3. Good affect.  SKIN: No obvious rash, lesion, or ulcer.   LABORATORY PANEL:   CBC  Recent Labs Lab 09/07/15 1544  WBC 21.1*  HGB 13.1  HCT 38.7*  PLT 261   ------------------------------------------------------------------------------------------------------------------  Chemistries   Recent Labs Lab 09/07/15 1544  NA 132*  K 3.1*  CL 84*  CO2 31  GLUCOSE 183*  BUN 73*  CREATININE 3.74*  CALCIUM 10.0   ------------------------------------------------------------------------------------------------------------------  Cardiac Enzymes  Recent Labs Lab 09/07/15 1544  TROPONINI 0.06*   ------------------------------------------------------------------------------------------------------------------  RADIOLOGY:  Dg Chest 2 View  09/07/2015  CLINICAL DATA:  Cough and congestion for several days. History of COPD. EXAM: CHEST  2 VIEW COMPARISON:  Chest CT, 07/27/2015 FINDINGS: Cardiac silhouette is normal in size and configuration. No mediastinal or right hilar masses or evidence of adenopathy. There is distortion and prominence of the left hilum consistent with previous radiation therapy scarring for lung carcinoma. Mild volume loss on the left. There is some prominent fat at the left anterior lung base, stable. There are reticular nodular opacities in the left upper lobe peripherally. The  more focal pleural based opacity noted in the posterior lateral left upper lobe on the prior CT is not seen on the standard radiographs. The reticular nodular opacity is stable from the exam. Right lung is hyperexpanded. No right lung mass or suspicious nodule. No lung consolidation or edema. No pleural effusion or pneumothorax. Bony thorax is demineralized but intact. IMPRESSION: 1. No acute cardiopulmonary disease. 2. Chronic changes on the left from treatment previous lung carcinoma. Left upper lobe reticular nodular opacities are also likely scarring similar to the prior chest CT. Electronically Signed   By: Lajean Manes M.D.   On: 09/07/2015 16:11     IMPRESSION AND PLAN:   * Acute renal failure over CKD stage III Likely due to dehydration. Check ultrasound kidneys. Check CK level. Hold Lasix and ACE inhibitor. Monitor input and output. Bolus normal saline 500 ML now and continue 75 ML per hour.  * Acute on chronic COPD exacerbation -IV steroids,  Antibiotics - Scheduled Nebulizers - Inhalers -Wean O2 as tolerated - Consult pulmonary if no improvement  * Hypertension Continue Cardizem. Hold ACE inhibitor.  * Insulin-dependent diabetes mellitus Continue Lantus with sliding scale insulin.  * Mild elevation troponin likely due to COPD exacerbation.  * DVT prophylaxis with heparin  All the records are reviewed and case discussed with ED provider. Management plans discussed with the patient, family and they are in agreement.  CODE STATUS: FULL  TOTAL TIME TAKING CARE OF THIS PATIENT: 40 minutes.   Hillary Bow R M.D on 09/07/2015 at 5:43 PM  Between 7am to 6pm - Pager - [phone]  After 6pm go to [email or web address] - password [email or web address] [place] [facility]  Office  [phone]  CC: Primary care physician; [name], MD  Note: This dictation was prepared with Dragon dictation along with smaller phrase technology. Any transcriptional errors that result  from this process are unintentional.

## 2015-09-07 NOTE — ED Notes (Signed)
Notified of elevated tropnin lever. EDP made aware

## 2015-09-08 LAB — CBC
HEMATOCRIT: 35.3 % — AB (ref 40.0–52.0)
Hemoglobin: 11.8 g/dL — ABNORMAL LOW (ref 13.0–18.0)
MCH: 27.2 pg (ref 26.0–34.0)
MCHC: 33.5 g/dL (ref 32.0–36.0)
MCV: 81.2 fL (ref 80.0–100.0)
Platelets: 217 10*3/uL (ref 150–440)
RBC: 4.34 MIL/uL — ABNORMAL LOW (ref 4.40–5.90)
RDW: 14.4 % (ref 11.5–14.5)
WBC: 13.2 10*3/uL — AB (ref 3.8–10.6)

## 2015-09-08 LAB — BASIC METABOLIC PANEL
ANION GAP: 13 (ref 5–15)
BUN: 59 mg/dL — AB (ref 6–20)
CO2: 28 mmol/L (ref 22–32)
Calcium: 9.3 mg/dL (ref 8.9–10.3)
Chloride: 93 mmol/L — ABNORMAL LOW (ref 101–111)
Creatinine, Ser: 3.06 mg/dL — ABNORMAL HIGH (ref 0.61–1.24)
GFR calc Af Amer: 22 mL/min — ABNORMAL LOW (ref >60)
GFR, EST NON AFRICAN AMERICAN: 19 mL/min — AB (ref >60)
Glucose, Bld: 226 mg/dL — ABNORMAL HIGH (ref 65–99)
POTASSIUM: 3.3 mmol/L — AB (ref 3.5–5.1)
SODIUM: 134 mmol/L — AB (ref 135–145)

## 2015-09-08 LAB — GLUCOSE, CAPILLARY
GLUCOSE-CAPILLARY: 173 mg/dL — AB (ref 65–99)
GLUCOSE-CAPILLARY: 175 mg/dL — AB (ref 65–99)
Glucose-Capillary: 358 mg/dL — ABNORMAL HIGH (ref 65–99)
Glucose-Capillary: 241 mg/dL — ABNORMAL HIGH (ref 65–99)

## 2015-09-08 LAB — TROPONIN I: Troponin I: 0.03 ng/mL (ref <0.031)

## 2015-09-08 MED ORDER — LEVOFLOXACIN IN D5W 250 MG/50ML IV SOLN
250.0000 mg | INTRAVENOUS | Status: DC
  Administered 2015-09-08: 250 mg via INTRAVENOUS
  Filled 2015-09-08 (×2): qty 50

## 2015-09-08 MED ORDER — PAROXETINE HCL 20 MG PO TABS
40.0000 mg | ORAL_TABLET | Freq: Two times a day (BID) | ORAL | Status: DC
  Administered 2015-09-08 – 2015-09-09 (×3): 40 mg via ORAL
  Filled 2015-09-08 (×3): qty 2

## 2015-09-08 MED ORDER — TRAZODONE HCL 100 MG PO TABS
100.0000 mg | ORAL_TABLET | Freq: Every day | ORAL | Status: DC
  Administered 2015-09-08 (×2): 100 mg via ORAL
  Filled 2015-09-08 (×2): qty 1

## 2015-09-08 MED ORDER — HYDROCODONE-HOMATROPINE 5-1.5 MG/5ML PO SYRP: 5.0000 mL | ORAL_SOLUTION | Freq: Four times a day (QID) | ORAL | Status: DC | PRN

## 2015-09-08 MED ORDER — INSULIN DETEMIR 100 UNIT/ML ~~LOC~~ SOLN
20.0000 [IU] | Freq: Every day | SUBCUTANEOUS | Status: DC
  Administered 2015-09-08 – 2015-09-09 (×2): 20 [IU] via SUBCUTANEOUS
  Filled 2015-09-08 (×3): qty 0.2

## 2015-09-08 MED ORDER — TAMSULOSIN HCL 0.4 MG PO CAPS
0.4000 mg | ORAL_CAPSULE | Freq: Every day | ORAL | Status: DC
  Administered 2015-09-08: 0.4 mg via ORAL
  Filled 2015-09-08: qty 1

## 2015-09-08 MED ORDER — MAGNESIUM OXIDE 400 MG PO TABS: 400.0000 mg | ORAL_TABLET | Freq: Every day | ORAL | Status: DC

## 2015-09-08 MED ORDER — TIOTROPIUM BROMIDE MONOHYDRATE 18 MCG IN CAPS
18.0000 ug | ORAL_CAPSULE | Freq: Every day | RESPIRATORY_TRACT | Status: DC
  Administered 2015-09-08 – 2015-09-09 (×2): 18 ug via RESPIRATORY_TRACT
  Filled 2015-09-08: qty 5

## 2015-09-08 MED ORDER — INSULIN DETEMIR 100 UNIT/ML ~~LOC~~ SOLN
40.0000 [IU] | Freq: Every day | SUBCUTANEOUS | Status: DC
  Administered 2015-09-08: 40 [IU] via SUBCUTANEOUS
  Filled 2015-09-08 (×2): qty 0.4

## 2015-09-08 MED ORDER — INSULIN DETEMIR 100 UNIT/ML ~~LOC~~ SOLN: 20.0000 [IU] | Freq: Two times a day (BID) | SUBCUTANEOUS | Status: DC

## 2015-09-08 MED ORDER — PRAVASTATIN SODIUM 20 MG PO TABS
80.0000 mg | ORAL_TABLET | Freq: Every day | ORAL | Status: DC
  Administered 2015-09-08: 80 mg via ORAL
  Filled 2015-09-08: qty 4

## 2015-09-08 MED ORDER — ENALAPRIL MALEATE 5 MG PO TABS
5.0000 mg | ORAL_TABLET | Freq: Every day | ORAL | Status: DC
  Filled 2015-09-08: qty 1

## 2015-09-08 MED ORDER — CITALOPRAM HYDROBROMIDE 20 MG PO TABS
20.0000 mg | ORAL_TABLET | Freq: Every day | ORAL | Status: DC
  Administered 2015-09-08 – 2015-09-09 (×2): 20 mg via ORAL
  Filled 2015-09-08 (×2): qty 1

## 2015-09-08 MED ORDER — LORATADINE 10 MG PO TABS
10.0000 mg | ORAL_TABLET | Freq: Every day | ORAL | Status: DC
  Administered 2015-09-08 – 2015-09-09 (×2): 10 mg via ORAL
  Filled 2015-09-08 (×2): qty 1

## 2015-09-08 MED ORDER — TIZANIDINE HCL 4 MG PO TABS
4.0000 mg | ORAL_TABLET | Freq: Every day | ORAL | Status: DC
  Administered 2015-09-08: 4 mg via ORAL
  Filled 2015-09-08: qty 1

## 2015-09-08 MED ORDER — HYDROXYZINE HCL 10 MG PO TABS
10.0000 mg | ORAL_TABLET | Freq: Every day | ORAL | Status: DC
  Administered 2015-09-08: 10 mg via ORAL
  Filled 2015-09-08 (×2): qty 1

## 2015-09-08 MED ORDER — ASPIRIN EC 81 MG PO TBEC
81.0000 mg | DELAYED_RELEASE_TABLET | Freq: Every day | ORAL | Status: DC
  Administered 2015-09-08 – 2015-09-09 (×2): 81 mg via ORAL
  Filled 2015-09-08 (×2): qty 1

## 2015-09-08 MED ORDER — MAGNESIUM OXIDE 400 (241.3 MG) MG PO TABS
400.0000 mg | ORAL_TABLET | Freq: Every day | ORAL | Status: DC
  Administered 2015-09-08 – 2015-09-09 (×2): 400 mg via ORAL
  Filled 2015-09-08 (×2): qty 1

## 2015-09-08 MED ORDER — THEOPHYLLINE ER 300 MG PO CP24
300.0000 mg | ORAL_CAPSULE | Freq: Every day | ORAL | Status: DC
  Administered 2015-09-08 – 2015-09-09 (×2): 300 mg via ORAL
  Filled 2015-09-08 (×2): qty 1

## 2015-09-08 MED ORDER — POTASSIUM CHLORIDE CRYS ER 20 MEQ PO TBCR
40.0000 meq | EXTENDED_RELEASE_TABLET | Freq: Once | ORAL | Status: AC
  Administered 2015-09-08: 40 meq via ORAL
  Filled 2015-09-08: qty 2

## 2015-09-08 MED ORDER — PANTOPRAZOLE SODIUM 40 MG PO TBEC
40.0000 mg | DELAYED_RELEASE_TABLET | Freq: Two times a day (BID) | ORAL | Status: DC
  Administered 2015-09-08 – 2015-09-09 (×3): 40 mg via ORAL
  Filled 2015-09-08 (×3): qty 1

## 2015-09-08 MED ORDER — DILTIAZEM HCL ER 180 MG PO CP24
180.0000 mg | ORAL_CAPSULE | Freq: Every day | ORAL | Status: DC
  Administered 2015-09-08 – 2015-09-09 (×2): 180 mg via ORAL
  Filled 2015-09-08 (×4): qty 1

## 2015-09-08 NOTE — Progress Notes (Signed)
[facility] NAME: Earl Clay    MR#:  [id number]  DATE OF BIRTH:  11-Aug-1940  SUBJECTIVE:  CHIEF COMPLAINT:   Chief Complaint  Patient presents with  . URI   Still has wheezing and SOB. Some improvement. Cough. Good urine output  REVIEW OF SYSTEMS:    Review of Systems  Constitutional: Positive for malaise/fatigue. Negative for fever and chills.  HENT: Negative for sore throat.   Eyes: Negative for blurred vision, double vision and pain.  Respiratory: Negative for cough, hemoptysis, shortness of breath and wheezing.   Cardiovascular: Negative for chest pain, palpitations, orthopnea and leg swelling.  Gastrointestinal: Negative for heartburn, nausea, vomiting, abdominal pain, diarrhea and constipation.  Genitourinary: Negative for dysuria and hematuria.  Musculoskeletal: Negative for back pain and joint pain.  Skin: Negative for rash.  Neurological: Positive for weakness. Negative for sensory change, speech change, focal weakness and headaches.  Endo/Heme/Allergies: Does not bruise/bleed easily.  Psychiatric/Behavioral: Negative for depression. The patient is not nervous/anxious.     DRUG ALLERGIES:   Allergies  Allergen Reactions  . Ampicillin Hives and Other (See Comments)    Has patient had a PCN reaction causing immediate rash, facial/tongue/throat swelling, SOB or lightheadedness with hypotension: No Has patient had a PCN reaction causing severe rash involving mucus membranes or skin necrosis: No Has patient had a PCN reaction that required hospitalization No Has patient had a PCN reaction occurring within the last 10 years: No If all of the above answers are "NO", then may proceed with Cephalosporin use.  Marland Kitchen Penicillins Hives and Other (See Comments)    Has patient had a PCN reaction causing immediate rash, facial/tongue/throat swelling, SOB or lightheadedness with hypotension: No Has patient had a  PCN reaction causing severe rash involving mucus membranes or skin necrosis: No Has patient had a PCN reaction that required hospitalization No Has patient had a PCN reaction occurring within the last 10 years: No If all of the above answers are "NO", then may proceed with Cephalosporin use.    VITALS:  Blood pressure 103/79, pulse 124, temperature 97.3 F (36.3 C), temperature source Oral, resp. rate 18, height '5\' 6"'$  (1.676 m), weight 74.345 kg (163 lb 14.4 oz), SpO2 96 %.  PHYSICAL EXAMINATION:   Physical Exam  GENERAL:  75 y.o.-year-old patient lying in the bed with respiratory distress.  EYES: Pupils equal, round, reactive to light and accommodation. No scleral icterus. Extraocular muscles intact.  HEENT: Head atraumatic, normocephalic. Oropharynx and nasopharynx clear.  NECK:  Supple, no jugular venous distention. No thyroid enlargement, no tenderness.  LUNGS: Increased work of breathing with bilateral wheezing CARDIOVASCULAR: S1, S2 normal. No murmurs, rubs, or gallops. Tachycardia ABDOMEN: Soft, nontender, nondistended. Bowel sounds present. No organomegaly or mass.  EXTREMITIES: No cyanosis, clubbing or edema b/l.    NEUROLOGIC: Cranial nerves II through XII are intact. No focal Motor or sensory deficits b/l.   PSYCHIATRIC: The patient is alert and oriented x 3.  SKIN: No obvious rash, lesion, or ulcer.   LABORATORY PANEL:   CBC  Recent Labs Lab 09/08/15 0425  WBC 13.2*  HGB 11.8*  HCT 35.3*  PLT 217   ------------------------------------------------------------------------------------------------------------------ Chemistries   Recent Labs Lab 09/08/15 0425  NA 134*  K 3.3*  CL 93*  CO2 28  GLUCOSE 226*  BUN 59*  CREATININE 3.06*  CALCIUM 9.3   ------------------------------------------------------------------------------------------------------------------  Cardiac Enzymes  Recent Labs Lab 09/08/15 0425  TROPONINI 0.03    ------------------------------------------------------------------------------------------------------------------  RADIOLOGY:  Dg Chest 2 View  09/07/2015  CLINICAL DATA:  Cough and congestion for several days. History of COPD. EXAM: CHEST  2 VIEW COMPARISON:  Chest CT, 07/27/2015 FINDINGS: Cardiac silhouette is normal in size and configuration. No mediastinal or right hilar masses or evidence of adenopathy. There is distortion and prominence of the left hilum consistent with previous radiation therapy scarring for lung carcinoma. Mild volume loss on the left. There is some prominent fat at the left anterior lung base, stable. There are reticular nodular opacities in the left upper lobe peripherally. The more focal pleural based opacity noted in the posterior lateral left upper lobe on the prior CT is not seen on the standard radiographs. The reticular nodular opacity is stable from the exam. Right lung is hyperexpanded. No right lung mass or suspicious nodule. No lung consolidation or edema. No pleural effusion or pneumothorax. Bony thorax is demineralized but intact. IMPRESSION: 1. No acute cardiopulmonary disease. 2. Chronic changes on the left from treatment previous lung carcinoma. Left upper lobe reticular nodular opacities are also likely scarring similar to the prior chest CT. Electronically Signed   By: Lajean Manes M.D.   On: 09/07/2015 16:11   US Renal  09/07/2015  CLINICAL DATA:  Acute renal failure EXAM: RENAL / URINARY TRACT ULTRASOUND COMPLETE COMPARISON:  08/27/2015 FINDINGS: Right Kidney: Length: 10.9 cm. 19 mm lateral lower pole hypoechoic lesion likely a small cyst. This is seen on CT scan performed 08/27/2015 as a nonenhancing structure and it is stable from 03/27/2015 in size. Left Kidney: Length: 11.3 cm. Echogenicity within normal limits. No mass or hydronephrosis visualized. Bladder: Appears normal for degree of bladder distention. IMPRESSION: No acute findings.  Small right renal  cyst. Electronically Signed   By: Skipper Cliche M.D.   On: 09/07/2015 18:39     ASSESSMENT AND PLAN:   * Acute renal failure over CKD stage III - Slowly improving Likely due to dehydration.  ultrasound kidneys - Nothing acute CK level- Normal Hold Lasix and ACE inhibitor. Monitor input and output. On IVF  * Acute on chronic COPD exacerbation - Mild improvement -IV steroids, Antibiotics - Scheduled Nebulizers - Inhalers - On 6 L O2 - Consult pulmonary if no improvement  * Hypertension Continue Cardizem. Hold ACE inhibitor.  * Insulin-dependent diabetes mellitus Continue Lantus with sliding scale insulin.  * Mild elevation troponin likely due to COPD exacerbation.  * DVT prophylaxis with heparin  All the records are reviewed and case discussed with Care Management/Social Workerr. Management plans discussed with the patient, family and they are in agreement.  CODE STATUS: FULL  DVT Prophylaxis: SCDs  TOTAL TIME TAKING CARE OF THIS PATIENT: 30 minutes.   POSSIBLE D/C IN 1-2 DAYS, DEPENDING ON CLINICAL CONDITION.  Hillary Bow R M.D on 09/08/2015 at 10:33 AM  Between 7am to 6pm - Pager - [phone]  After 6pm go to [email or web address] - password [email or web address] [facility]  Office  [phone]  CC: Primary care physician; [name], MD  Note: This dictation was prepared with Dragon dictation along with smaller phrase technology. Any transcriptional errors that result from this process are unintentional.

## 2015-09-08 NOTE — Progress Notes (Signed)
Pt is not tolerating CPAP well. Pt doesn't wish to wear the CPAP at this time. Pt states he is too nervous. Pt returned to 6L [place]. Erline Levine, RN aware

## 2015-09-08 NOTE — Progress Notes (Signed)
Pt placed on [facility] Auto CPAP. Tolerating well

## 2015-09-08 NOTE — Progress Notes (Signed)
Called Dr. Melynda Ripple regarding trazodone per patient request.  Had to add med to PTA med list because it was not added.  Patient takes 100 mg daily at bedtime.  Doctor put in appropriate orders.  Earl Clay  09/08/2015  3:21 AM

## 2015-09-08 NOTE — Progress Notes (Signed)
Initial Nutrition Assessment  DOCUMENTATION CODES:   Non-severe (moderate) malnutrition in context of chronic illness  INTERVENTION:   Meals and Snacks: Cater to patient preferences Medical Food Supplement Therapy: Will send Magic Cup BID as pt reports liking ice cream but not wanting to drink Ensure/Glucerna supplement   NUTRITION DIAGNOSIS:   Malnutrition related to chronic illness as evidenced by percent weight loss, moderate depletions of muscle mass.  GOAL:   Patient will meet greater than or equal to 90% of their needs  MONITOR:    (Energy Intake, Glucose Profile, anthropometrics, digestive system, Pulmonary Profile)  REASON FOR ASSESSMENT:   Diagnosis    ASSESSMENT:    Pt admitted with SOB and acute renal failure. Pt with pancreatic mass, scheduled for biopsy on 3/16 per wife at bedside.   Past Medical History  Diagnosis Date  . Lung cancer ([place])   . Diabetes mellitus without complication ([place])   . COPD (chronic obstructive pulmonary disease) ([place])   . CAD (coronary artery disease)   . GERD (gastroesophageal reflux disease)   . PTSD (post-traumatic stress disorder)   . Pancreatic mass     Diet Order:  Diet Carb Modified Fluid consistency:: Thin; Room service appropriate?: Yes    Current Nutrition: Pt ate 100% of lunch today of salad and pasta.   Food/Nutrition-Related History: Pt reports not eating much yesterday as he was being admitted with SOB and did not have an appetite. Pt reports eating per usual at home PTA. Pt reports decrease in appetite but has still been eating 2-3 meals per day.   Scheduled Medications:  . antiseptic oral rinse  7 mL Mouth Rinse q12n4p  . aspirin EC  81 mg Oral Daily  . chlorhexidine  15 mL Mouth Rinse BID  . citalopram  20 mg Oral Daily  . diltiazem  180 mg Oral Daily  . heparin  5,000 Units Subcutaneous 3 times per day  . hydrOXYzine  10 mg Oral QHS  . insulin aspart  0-15 Units Subcutaneous TID WC  . insulin aspart   0-5 Units Subcutaneous QHS  . insulin detemir  20 Units Subcutaneous Daily   And  . insulin detemir  40 Units Subcutaneous QHS  . ipratropium-albuterol  3 mL Nebulization Q4H  . levofloxacin (LEVAQUIN) IV  250 mg Intravenous Q48H  . loratadine  10 mg Oral Daily  . magnesium oxide  400 mg Oral Daily  . methylPREDNISolone (SOLU-MEDROL) injection  60 mg Intravenous Q12H  . pantoprazole  40 mg Oral BID AC  . PARoxetine  40 mg Oral BID  . pravastatin  80 mg Oral QHS  . tamsulosin  0.4 mg Oral QHS  . theophylline  300 mg Oral Daily  . tiotropium  18 mcg Inhalation Daily  . tiZANidine  4 mg Oral QHS  . traZODone  100 mg Oral QHS    Continuous Medications:  . 0.9 % NaCl with KCl 20 mEq / L 75 mL/hr at 09/08/15 1007     Electrolyte/Renal Profile and Glucose Profile:   Recent Labs Lab 09/07/15 1544 09/08/15 0425  NA 132* 134*  K 3.1* 3.3*  CL 84* 93*  CO2 31 28  BUN 73* 59*  CREATININE 3.74* 3.06*  CALCIUM 10.0 9.3  GLUCOSE 183* 226*   Protein Profile: No results for input(s): ALBUMIN in the last 168 hours.  Gastrointestinal Profile: Last BM: 09/06/2015   Nutrition-Focused Physical Exam Findings: Nutrition-Focused physical exam completed. Findings are no fat depletion/slight mild fat depletion on tricep, moderate  muscle depletion, and no edema.  Wife reports to RD that pt has been having lower extremity weakness and SOB with exertion.   Weight Change: Pt reports weight loss of 50lbs in the past 4 months as he was weighing 220lbs 4 months ago. (26% weight loss in 4 months)    Height:   Ht Readings from Last 1 Encounters:  09/07/15 '5\' 6"'$  (1.676 m)    Weight:   Wt Readings from Last 1 Encounters:  09/08/15 163 lb 14.4 oz (74.345 kg)   Wt Readings from Last 10 Encounters:  09/08/15 163 lb 14.4 oz (74.345 kg)  08/28/15 173 lb 11.6 oz (78.8 kg)  08/16/15 174 lb 13.2 oz (79.3 kg)  08/08/15 175 lb 2 oz (79.436 kg)  02/19/15 191 lb 4 oz (86.75 kg)     BMI:  Body  mass index is 26.47 kg/(m^2).  Estimated Nutritional Needs:   Kcal:  BEE: 1430kcals, TEE: (IF 1.1-1.3)(AF 1.2) 0762-2633HLKTG  Protein:  74-89g protein (1.0-1.2g/kg)  Fluid:  1850-2215m of fluid (25-388mkg)  EDUCATION NEEDS:   No education needs identified at this time   MOOmerRD, LDN Pager ([phone]/On-Call Pager ([phone]

## 2015-09-08 NOTE — Progress Notes (Signed)
Doctor Sudini was notified concerning administration of Diltiazem BP 103/79 pulse 108. New orders to give at this time. Will continue to monitor pt.  Earl Clay

## 2015-09-09 DIAGNOSIS — J441 Chronic obstructive pulmonary disease with (acute) exacerbation: Secondary | ICD-10-CM | POA: Diagnosis present

## 2015-09-09 DIAGNOSIS — E440 Moderate protein-calorie malnutrition: Secondary | ICD-10-CM | POA: Diagnosis present

## 2015-09-09 LAB — GLUCOSE, CAPILLARY
Glucose-Capillary: 192 mg/dL — ABNORMAL HIGH (ref 65–99)
Glucose-Capillary: 231 mg/dL — ABNORMAL HIGH (ref 65–99)

## 2015-09-09 LAB — BASIC METABOLIC PANEL
ANION GAP: 9 (ref 5–15)
BUN: 58 mg/dL — ABNORMAL HIGH (ref 6–20)
CHLORIDE: 101 mmol/L (ref 101–111)
CO2: 27 mmol/L (ref 22–32)
Calcium: 9.2 mg/dL (ref 8.9–10.3)
Creatinine, Ser: 2.4 mg/dL — ABNORMAL HIGH (ref 0.61–1.24)
GFR calc non Af Amer: 25 mL/min — ABNORMAL LOW (ref >60)
GFR, EST AFRICAN AMERICAN: 29 mL/min — AB (ref >60)
Glucose, Bld: 211 mg/dL — ABNORMAL HIGH (ref 65–99)
Potassium: 3.8 mmol/L (ref 3.5–5.1)
Sodium: 137 mmol/L (ref 135–145)

## 2015-09-09 MED ORDER — LEVOFLOXACIN IN D5W 250 MG/50ML IV SOLN
250.0000 mg | INTRAVENOUS | Status: DC
  Filled 2015-09-09: qty 50

## 2015-09-09 MED ORDER — PREDNISONE 50 MG PO TABS: 50.0000 mg | ORAL_TABLET | Freq: Every day | ORAL | Status: DC

## 2015-09-09 NOTE — Care Management Note (Signed)
Case Management Note  Patient Details  Name: MALIC ROSTEN MRN: [id number] Date of Birth: September 29, 1940  Subjective/Objective:       Discussed home oxygen with Lauren RN. This Probation officer stated that if Mr Ballin is on chronic continuous home oxygen that he can be discharged home with his own portable oxygen. However, if he is on nocturnal oxygen only at home, to please notify this Probation officer.             Action/Plan:   Expected Discharge Date:                  Expected Discharge Plan:     In-House Referral:     Discharge planning Services     Post Acute Care Choice:    Choice offered to:     DME Arranged:    DME Agency:     HH Arranged:    [place] Agency:     Status of Service:     Medicare Important Message Given:    Date Medicare IM Given:    Medicare IM give by:    Date Additional Medicare IM Given:    Additional Medicare Important Message give by:     If discussed at [place] of Stay Meetings, dates discussed:    Additional Comments:  Kaniesha Barile A, RN 09/09/2015, 1:56 PM

## 2015-09-09 NOTE — Progress Notes (Signed)
Patient discharge teaching given, including activity, diet, follow-up appoints, and medications. Patient verbalized understanding of all discharge instructions. IV access was d/c'd. Vitals are stable. Skin is intact except as charted in most recent assessments.Talked to case manager regarding home oxygen and Lyn confirmed that pt has home oxygen. Pt to be escorted out by NT, to be driven home by family.  Earl Clay

## 2015-09-09 NOTE — Discharge Instructions (Signed)
°  DIET:  Cardiac diet and Diabetic diet  DISCHARGE CONDITION:  Stable  ACTIVITY:  Activity as tolerated  OXYGEN:  Home Oxygen: Yes.     Oxygen Delivery: 6 liters/min via Patient connected to nasal cannula oxygen  DISCHARGE LOCATION:  home   If you experience worsening of your admission symptoms, develop shortness of breath, life threatening emergency, suicidal or homicidal thoughts you must seek medical attention immediately by calling 911 or calling your MD immediately  if symptoms less severe.  You Must read complete instructions/literature along with all the possible adverse reactions/side effects for all the Medicines you take and that have been prescribed to you. Take any new Medicines after you have completely understood and accpet all the possible adverse reactions/side effects.   Please note  You were cared for by a hospitalist during your hospital stay. If you have any questions about your discharge medications or the care you received while you were in the hospital after you are discharged, you can call the unit and asked to speak with the hospitalist on call if the hospitalist that took care of you is not available. Once you are discharged, your primary care physician will handle any further medical issues. Please note that NO REFILLS for any discharge medications will be authorized once you are discharged, as it is imperative that you return to your primary care physician (or establish a relationship with a primary care physician if you do not have one) for your aftercare needs so that they can reassess your need for medications and monitor your lab values.

## 2015-09-09 NOTE — Progress Notes (Signed)
Pt placed on home CPAP with 6L O2 in line. Pt tolerating well.

## 2015-09-10 ENCOUNTER — Ambulatory Visit: Payer: Medicare Other | Admitting: Radiation Oncology

## 2015-09-12 LAB — CULTURE, BLOOD (ROUTINE X 2)
CULTURE: NO GROWTH
CULTURE: NO GROWTH

## 2015-09-15 NOTE — Discharge Summary (Signed)
[facility] [place] NAME: Earl Clay    MR#:  [id number]  DATE OF BIRTH:  12-08-1940  DATE OF ADMISSION:  09/07/2015 ADMITTING PHYSICIAN: Earl Bow, MD  DATE OF DISCHARGE: 09/09/2015  2:16 PM  PRIMARY CARE PHYSICIAN: Clay,Earl D, MD   ADMISSION DIAGNOSIS:  ARF (acute renal failure) (HCC) [N17.9] Acute renal insufficiency [N28.9] Chronic obstructive pulmonary disease with acute exacerbation (HCC) [J44.1]  DISCHARGE DIAGNOSIS:  Active Problems:   ARF (acute renal failure) (HCC)   Malnutrition of moderate degree   COPD exacerbation ([place])   SECONDARY DIAGNOSIS:   Past Medical History  Diagnosis Date  . Lung cancer ([place])   . Diabetes mellitus without complication ([place])   . COPD (chronic obstructive pulmonary disease) ([place])   . CAD (coronary artery disease)   . GERD (gastroesophageal reflux disease)   . PTSD (post-traumatic stress disorder)   . Pancreatic mass      ADMITTING HISTORY  Earl Clay is a 75 y.o. male with a known history of lung cancer, diabetes, CAD, hypertension, COPD, chronic respiratory failure on 6 L suctioned plans to the hospital complaining of severe fatigue, cough of 3 days. Patient called his primary care physician Dr. Doy Clay yesterday and was given a prescription for prednisone. He took 1 dose yesterday with no improvement present her to the emergency room. Here his saturations are stable on 6 L at more than 90% but he is visibly short of breath. Chest x-ray shows scarring and nodules which are chronic without any infiltrates or edema. Lab work showed creatinine significantly elevated from baseline 3.7. Baseline creatinine is close to 1.6. He has had normal urine output. No recent change in medications. He did have decreased oral intake. Does not use any NSAIDs.  Patient was diagnosed with a pancreatic mass and is supposed to have an biopsied through endoscopy next week.  HOSPITAL  COURSE:   * Acute renal failure over CKD stage III - close to normal by day of discharge. Likely due to dehydration. ultrasound kidneys - Nothing acute CK level- Normal Lasix and ACE inhibitor held during hospital admission and will be restarted at discharge. Was On IVF during hospitalization with good urine output and improvement in kidney function.  * Acute on chronic COPD exacerbation - Mild improvement -IV steroids, Antibiotics - Scheduled Nebulizers - Inhalers - On 6 L O2 at baseline Patient will be switched to oral antibiotics and prednisone at discharge. He will continue his home inhalers and nebulizers.  * Hypertension Continue home medications  * Insulin-dependent diabetes mellitus Continue Lantus with sliding scale insulin.  * Mild elevation troponin likely due to COPD exacerbation. Stable  * DVT prophylaxis with heparin during hospitalization  Patient was admitted to the hospital due to COPD exacerbation and acute renal failure. These improved by day of discharge and patient felt back to baseline. He does mention that he is always short of breath and has chronic wheezing.  Stable for discharge home to follow-up with his primary care physician.  CONSULTS OBTAINED:     DRUG ALLERGIES:   Allergies  Allergen Reactions  . Ampicillin Hives and Other (See Comments)    Has patient had a PCN reaction causing immediate rash, facial/tongue/throat swelling, SOB or lightheadedness with hypotension: No Has patient had a PCN reaction causing severe rash involving mucus membranes or skin necrosis: No Has patient had a PCN reaction that required hospitalization No Has patient had a PCN reaction occurring within the last 10  years: No If all of the above answers are "NO", then may proceed with Cephalosporin use.  Marland Kitchen Penicillins Hives and Other (See Comments)    Has patient had a PCN reaction causing immediate rash, facial/tongue/throat swelling, SOB or lightheadedness with  hypotension: No Has patient had a PCN reaction causing severe rash involving mucus membranes or skin necrosis: No Has patient had a PCN reaction that required hospitalization No Has patient had a PCN reaction occurring within the last 10 years: No If all of the above answers are "NO", then may proceed with Cephalosporin use.    DISCHARGE MEDICATIONS:   Discharge Medication List as of 09/09/2015 11:34 AM    CONTINUE these medications which have CHANGED   Details  predniSONE (DELTASONE) 50 MG tablet Take 1 tablet (50 mg total) by mouth daily with breakfast., Starting 09/10/2015, Until Discontinued, Print      CONTINUE these medications which have NOT CHANGED   Details  albuterol (PROVENTIL HFA;VENTOLIN HFA) 108 (90 Base) MCG/ACT inhaler Inhale 2 puffs into the lungs every 6 (six) hours as needed for wheezing or shortness of breath., Until Discontinued, Historical Med    albuterol (PROVENTIL) (2.5 MG/3ML) 0.083% nebulizer solution Take 2.5 mg by nebulization every 4 (four) hours as needed for wheezing or shortness of breath. , Until Discontinued, Historical Med    aspirin EC 81 MG tablet Take 81 mg by mouth daily., Until Discontinued, Historical Med    cetirizine (ZYRTEC) 10 MG tablet Take 10 mg by mouth daily. , Until Discontinued, Historical Med    citalopram (CELEXA) 20 MG tablet Take 20 mg by mouth daily. , Until Discontinued, Historical Med    diltiazem (DILACOR XR) 180 MG 24 hr capsule Take 180 mg by mouth daily., Until Discontinued, Historical Med    doxycycline (VIBRA-TABS) 100 MG tablet Take 100 mg by mouth 2 (two) times daily., Starting 09/06/2015, Until Thu 09/13/15, Historical Med    furosemide (LASIX) 20 MG tablet Take 20 mg by mouth daily. , Until Discontinued, Historical Med    HYDROcodone-homatropine (HYCODAN) 5-1.5 MG/5ML syrup Take 5 mLs by mouth every 6 (six) hours as needed for cough., Until Discontinued, Historical Med    hydrOXYzine (ATARAX/VISTARIL) 10 MG tablet Take  10 mg by mouth at bedtime., Until Discontinued, Historical Med    insulin aspart (NOVOLOG) 100 UNIT/ML injection Inject 2-28 Units into the skin 3 (three) times daily with meals as needed for high blood sugar. Pt uses as needed per sliding scale., Until Discontinued, Historical Med    insulin detemir (LEVEMIR) 100 UNIT/ML injection Inject 20-40 Units into the skin 2 (two) times daily. Pt uses 20 units in the morning and 40 units at night., Until Discontinued, Historical Med    magnesium oxide (MAG-OX) 400 MG tablet Take 400 mg by mouth daily. , Until Discontinued, Historical Med    pantoprazole (PROTONIX) 40 MG tablet Take 40 mg by mouth 2 (two) times daily before a meal. , Until Discontinued, Historical Med    PARoxetine (PAXIL) 40 MG tablet Take 40 mg by mouth 2 (two) times daily. , Until Discontinued, Historical Med    pravastatin (PRAVACHOL) 80 MG tablet Take 80 mg by mouth at bedtime. , Until Discontinued, Historical Med    sildenafil (VIAGRA) 100 MG tablet Take 100 mg by mouth daily as needed for erectile dysfunction. , Until Discontinued, Historical Med    tamsulosin (FLOMAX) 0.4 MG CAPS capsule Take 0.4 mg by mouth at bedtime., Until Discontinued, Historical Med    theophylline (THEO-24)  300 MG 24 hr capsule Take 300 mg by mouth daily., Until Discontinued, Historical Med    tiotropium (SPIRIVA) 18 MCG inhalation capsule Place 18 mcg into inhaler and inhale daily. , Until Discontinued, Historical Med    tiZANidine (ZANAFLEX) 4 MG tablet Take 4 mg by mouth at bedtime., Until Discontinued, Historical Med    traZODone (DESYREL) 100 MG tablet Take 100 mg by mouth at bedtime., Until Discontinued, Historical Med      STOP taking these medications     enalapril (VASOTEC) 5 MG tablet      hydrochlorothiazide (HYDRODIURIL) 25 MG tablet         Today   VITAL SIGNS:  Blood pressure 108/62, pulse 79, temperature 97.6 F (36.4 C), temperature source Oral, resp. rate 18, height '5\' 6"'$   (1.676 m), weight 76.25 kg (168 lb 1.6 oz), SpO2 92 %.  I/O:  No intake or output data in the 24 hours ending 09/15/15 1445  PHYSICAL EXAMINATION:  Physical Exam  GENERAL:  75 y.o.-year-old patient lying in the bed with no acute distress.  LUNGS: Normal work of breathing with bilateral wheezing. CARDIOVASCULAR: S1, S2 normal. No murmurs, rubs, or gallops.  ABDOMEN: Soft, non-tender, non-distended. Bowel sounds present. No organomegaly or mass.  NEUROLOGIC: Moves all 4 extremities. PSYCHIATRIC: The patient is alert and oriented x 3.  SKIN: No obvious rash, lesion, or ulcer.   DATA REVIEW:   CBC No results for input(s): WBC, HGB, HCT, PLT in the last 168 hours.  Chemistries   Recent Labs Lab 09/09/15 0641  NA 137  K 3.8  CL 101  CO2 27  GLUCOSE 211*  BUN 58*  CREATININE 2.40*  CALCIUM 9.2    Cardiac Enzymes No results for input(s): TROPONINI in the last 168 hours.  Microbiology Results  Results for orders placed or performed during the hospital encounter of 09/07/15  Culture, blood (routine x 2)     Status: None   Collection Time: 09/07/15  5:01 PM  Result Value Ref Range Status   Specimen Description BLOOD LEFT HAND  Final   Special Requests [facility]  Final   Culture NO GROWTH 5 DAYS  Final   Report Status 09/12/2015 FINAL  Final  Rapid Influenza A&B Antigens ([place] only)     Status: None   Collection Time: 09/07/15  5:01 PM  Result Value Ref Range Status   Influenza A ([place]) NEGATIVE NEGATIVE Final   Influenza B ([facility]) NEGATIVE NEGATIVE Final  Culture, blood (routine x 2)     Status: None   Collection Time: 09/07/15  5:02 PM  Result Value Ref Range Status   Specimen Description BLOOD RIGHT HAND  Final   Special Requests BAA,5MM,ANA,AER  Final   Culture NO GROWTH 5 DAYS  Final   Report Status 09/12/2015 FINAL  Final    RADIOLOGY:  No results found.  Follow up with PCP in 1 week.  Management plans discussed with the patient, family and they are  in agreement.  CODE STATUS:  Code Status History    Date Active Date Inactive Code Status Order ID Comments User Context   09/07/2015  5:41 PM 09/09/2015  5:17 PM Full Code [id number]  Earl Bow, MD ED    Advance Directive Documentation        Most Recent Value   Type of Advance Directive  Healthcare Power of Attorney, Living will   Pre-existing out of facility DNR order (yellow form or pink MOST form)     "MOST" Form  in Place?        TOTAL TIME TAKING CARE OF THIS PATIENT ON DAY OF DISCHARGE: more than 30 minutes.   Earl Clay R M.D on 09/15/2015 at 2:45 PM  Between 7am to 6pm - Pager - [phone]  After 6pm go to [email or web address] - password [email or web address] [facility]  Office  [phone]  CC: Primary care physician; [name], MD  Note: This dictation was prepared with Dragon dictation along with smaller phrase technology. Any transcriptional errors that result from this process are unintentional.

## 2015-09-19 ENCOUNTER — Encounter: Payer: Self-pay | Admitting: *Deleted

## 2015-09-20 ENCOUNTER — Encounter: Payer: Self-pay | Admitting: *Deleted

## 2015-09-20 ENCOUNTER — Ambulatory Visit
Admission: RE | Admit: 2015-09-20 | Discharge: 2015-09-20 | Disposition: A | Discharge status: Home / Self Care | Payer: Medicare Other | Source: Ambulatory Visit | Attending: Gastroenterology | Admitting: Gastroenterology

## 2015-09-20 ENCOUNTER — Ambulatory Visit: Payer: Medicare Other | Admitting: Anesthesiology

## 2015-09-20 ENCOUNTER — Encounter
Admission: RE | Disposition: A | Discharge status: Home / Self Care | Payer: Self-pay | Source: Ambulatory Visit | Attending: Gastroenterology

## 2015-09-20 DIAGNOSIS — I10 Essential (primary) hypertension: Secondary | ICD-10-CM | POA: Insufficient documentation

## 2015-09-20 DIAGNOSIS — Z794 Long term (current) use of insulin: Secondary | ICD-10-CM | POA: Insufficient documentation

## 2015-09-20 DIAGNOSIS — Z7982 Long term (current) use of aspirin: Secondary | ICD-10-CM | POA: Diagnosis not present

## 2015-09-20 DIAGNOSIS — Z87891 Personal history of nicotine dependence: Secondary | ICD-10-CM | POA: Insufficient documentation

## 2015-09-20 DIAGNOSIS — G4730 Sleep apnea, unspecified: Secondary | ICD-10-CM | POA: Diagnosis not present

## 2015-09-20 DIAGNOSIS — K219 Gastro-esophageal reflux disease without esophagitis: Secondary | ICD-10-CM | POA: Insufficient documentation

## 2015-09-20 DIAGNOSIS — Z880 Allergy status to penicillin: Secondary | ICD-10-CM | POA: Insufficient documentation

## 2015-09-20 DIAGNOSIS — Z9981 Dependence on supplemental oxygen: Secondary | ICD-10-CM | POA: Diagnosis not present

## 2015-09-20 DIAGNOSIS — J449 Chronic obstructive pulmonary disease, unspecified: Secondary | ICD-10-CM | POA: Insufficient documentation

## 2015-09-20 DIAGNOSIS — I2510 Atherosclerotic heart disease of native coronary artery without angina pectoris: Secondary | ICD-10-CM | POA: Diagnosis not present

## 2015-09-20 DIAGNOSIS — E119 Type 2 diabetes mellitus without complications: Secondary | ICD-10-CM | POA: Diagnosis not present

## 2015-09-20 DIAGNOSIS — Z79899 Other long term (current) drug therapy: Secondary | ICD-10-CM | POA: Diagnosis not present

## 2015-09-20 DIAGNOSIS — J45909 Unspecified asthma, uncomplicated: Secondary | ICD-10-CM | POA: Diagnosis not present

## 2015-09-20 DIAGNOSIS — J398 Other specified diseases of upper respiratory tract: Secondary | ICD-10-CM | POA: Diagnosis not present

## 2015-09-20 DIAGNOSIS — F329 Major depressive disorder, single episode, unspecified: Secondary | ICD-10-CM | POA: Diagnosis not present

## 2015-09-20 DIAGNOSIS — F4310 Post-traumatic stress disorder, unspecified: Secondary | ICD-10-CM | POA: Insufficient documentation

## 2015-09-20 DIAGNOSIS — K8689 Other specified diseases of pancreas: Secondary | ICD-10-CM | POA: Insufficient documentation

## 2015-09-20 HISTORY — DX: Depression, unspecified: F32.A

## 2015-09-20 HISTORY — DX: Failed or difficult intubation, initial encounter: T88.4XXA

## 2015-09-20 HISTORY — DX: Major depressive disorder, single episode, unspecified: F32.9

## 2015-09-20 HISTORY — PX: UPPER ESOPHAGEAL ENDOSCOPIC ULTRASOUND (EUS): SHX6562

## 2015-09-20 HISTORY — DX: Dependence on supplemental oxygen: Z99.81

## 2015-09-20 HISTORY — DX: Unspecified asthma, uncomplicated: J45.909

## 2015-09-20 HISTORY — DX: Sleep apnea, unspecified: G47.30

## 2015-09-20 HISTORY — DX: Essential (primary) hypertension: I10

## 2015-09-20 LAB — GLUCOSE, CAPILLARY: Glucose-Capillary: 108 mg/dL — ABNORMAL HIGH (ref 65–99)

## 2015-09-20 SURGERY — UPPER ESOPHAGEAL ENDOSCOPIC ULTRASOUND (EUS)
Anesthesia: General

## 2015-09-20 MED ORDER — PHENYLEPHRINE HCL 10 MG/ML IJ SOLN
INTRAMUSCULAR | Status: DC | PRN
  Administered 2015-09-20: 200 ug via INTRAVENOUS

## 2015-09-20 MED ORDER — LIDOCAINE HCL (PF) 1 % IJ SOLN
2.0000 mL | Freq: Once | INTRAMUSCULAR | Status: AC
  Administered 2015-09-20: 2 mL via INTRADERMAL

## 2015-09-20 MED ORDER — LIDOCAINE HCL (CARDIAC) 20 MG/ML IV SOLN
INTRAVENOUS | Status: DC | PRN
  Administered 2015-09-20: 100 mg via INTRAVENOUS

## 2015-09-20 MED ORDER — SODIUM CHLORIDE 0.9 % IV SOLN: INTRAVENOUS | Status: DC

## 2015-09-20 MED ORDER — IPRATROPIUM-ALBUTEROL 0.5-2.5 (3) MG/3ML IN SOLN
3.0000 mL | Freq: Once | RESPIRATORY_TRACT | Status: AC
Start: 2015-09-20 — End: 2015-09-20
  Administered 2015-09-20: 3 mL via RESPIRATORY_TRACT

## 2015-09-20 MED ORDER — SUCCINYLCHOLINE CHLORIDE 20 MG/ML IJ SOLN
INTRAMUSCULAR | Status: DC | PRN
  Administered 2015-09-20: 100 mg via INTRAVENOUS

## 2015-09-20 MED ORDER — LIDOCAINE HCL (PF) 1 % IJ SOLN
INTRAMUSCULAR | Status: AC
  Administered 2015-09-20: 2 mL via INTRADERMAL
  Filled 2015-09-20: qty 2

## 2015-09-20 MED ORDER — IPRATROPIUM-ALBUTEROL 0.5-2.5 (3) MG/3ML IN SOLN
RESPIRATORY_TRACT | Status: AC
  Administered 2015-09-20: 3 mL via RESPIRATORY_TRACT
  Filled 2015-09-20: qty 3

## 2015-09-20 MED ORDER — LACTATED RINGERS IV SOLN
INTRAVENOUS | Status: DC
  Administered 2015-09-20: 14:00:00 via INTRAVENOUS

## 2015-09-20 MED ORDER — ETOMIDATE 2 MG/ML IV SOLN
INTRAVENOUS | Status: DC | PRN
  Administered 2015-09-20: 20 mg via INTRAVENOUS

## 2015-09-20 MED ORDER — FENTANYL CITRATE (PF) 100 MCG/2ML IJ SOLN
INTRAMUSCULAR | Status: DC | PRN
  Administered 2015-09-20: 50 ug via INTRAVENOUS

## 2015-09-20 NOTE — Transfer of Care (Signed)
Immediate Anesthesia Transfer of Care Note  Patient: Earl Clay  Procedure(s) Performed: Procedure(s): UPPER ESOPHAGEAL ENDOSCOPIC ULTRASOUND (EUS) (N/A)  Patient Location: Endoscopy Unit  Anesthesia Type:General  Level of Consciousness: awake, alert , oriented and patient cooperative  Airway & Oxygen Therapy: Patient Spontanous Breathing and Patient connected to face mask oxygen  Post-op Assessment: Report given to RN, Post -op Vital signs reviewed and stable and Patient moving all extremities X 4  Post vital signs: Reviewed and stable  Last Vitals:  Filed Vitals:   09/20/15 1322 09/20/15 1532  BP: 93/52   Pulse:  103  Temp:  36.3 C  Resp:  18    Complications: No apparent anesthesia complications

## 2015-09-20 NOTE — H&P (Signed)
Primary Care Physician:  [name], MD  Pre-Procedure History & Physical: HPI:  Earl Clay is a 75 y.o. male is here for an Upper EUS for suspected mass of the pancreas.   Past Medical History  Diagnosis Date  . Lung cancer ([place])   . Diabetes mellitus without complication ([place])   . COPD (chronic obstructive pulmonary disease) ([place])   . CAD (coronary artery disease)   . GERD (gastroesophageal reflux disease)   . PTSD (post-traumatic stress disorder)   . Pancreatic mass   . Hypertension   . Asthma   . Sleep apnea     CPAP with O2 2L/m at night  . Oxygen dependent     2-2.5L/m continuously  . Depression     Past Surgical History  Procedure Laterality Date  . Knee surgery Right     right knee repair  . Hernia repair Right     right inguinal hernia repair    Prior to Admission medications   Medication Sig Start Date End Date Taking? Authorizing Provider  albuterol (PROVENTIL HFA;VENTOLIN HFA) 108 (90 Base) MCG/ACT inhaler Inhale 2 puffs into the lungs every 6 (six) hours as needed for wheezing or shortness of breath.   Yes Historical Provider, MD  albuterol (PROVENTIL) (2.5 MG/3ML) 0.083% nebulizer solution Take 2.5 mg by nebulization every 4 (four) hours as needed for wheezing or shortness of breath.    Yes Historical Provider, MD  aspirin EC 81 MG tablet Take 81 mg by mouth daily.   Yes Historical Provider, MD  cetirizine (ZYRTEC) 10 MG tablet Take 10 mg by mouth daily.    Yes Historical Provider, MD  citalopram (CELEXA) 20 MG tablet Take 20 mg by mouth daily.    Yes Historical Provider, MD  diltiazem (DILACOR XR) 180 MG 24 hr capsule Take 180 mg by mouth daily.   Yes Historical Provider, MD  furosemide (LASIX) 20 MG tablet Take 20 mg by mouth daily.    Yes Historical Provider, MD  HYDROcodone-homatropine (HYCODAN) 5-1.5 MG/5ML syrup Take 5 mLs by mouth every 6 (six) hours as needed for cough.   Yes Historical Provider, MD  hydrOXYzine (ATARAX/VISTARIL) 10 MG  tablet Take 10 mg by mouth at bedtime.   Yes Historical Provider, MD  insulin detemir (LEVEMIR) 100 UNIT/ML injection Inject 20-40 Units into the skin 2 (two) times daily. Pt uses 20 units in the morning and 40 units at night.   Yes Historical Provider, MD  magnesium oxide (MAG-OX) 400 MG tablet Take 400 mg by mouth daily.    Yes Historical Provider, MD  pantoprazole (PROTONIX) 40 MG tablet Take 40 mg by mouth 2 (two) times daily before a meal.    Yes Historical Provider, MD  PARoxetine (PAXIL) 40 MG tablet Take 40 mg by mouth 2 (two) times daily.    Yes Historical Provider, MD  pravastatin (PRAVACHOL) 80 MG tablet Take 80 mg by mouth at bedtime.    Yes Historical Provider, MD  predniSONE (DELTASONE) 50 MG tablet Take 1 tablet (50 mg total) by mouth daily with breakfast. 09/10/15  Yes Srikar Sudini, MD  tamsulosin (FLOMAX) 0.4 MG CAPS capsule Take 0.4 mg by mouth at bedtime.   Yes Historical Provider, MD  theophylline (THEO-24) 300 MG 24 hr capsule Take 300 mg by mouth daily.   Yes Historical Provider, MD  tiotropium (SPIRIVA) 18 MCG inhalation capsule Place 18 mcg into inhaler and inhale daily.    Yes Historical Provider, MD  tiZANidine (ZANAFLEX) 4 MG tablet  Take 4 mg by mouth at bedtime.   Yes Historical Provider, MD  traZODone (DESYREL) 100 MG tablet Take 100 mg by mouth at bedtime.   Yes Historical Provider, MD  insulin aspart (NOVOLOG) 100 UNIT/ML injection Inject 2-28 Units into the skin 3 (three) times daily with meals as needed for high blood sugar. Pt uses as needed per sliding scale.    Historical Provider, MD  sildenafil (VIAGRA) 100 MG tablet Take 100 mg by mouth daily as needed for erectile dysfunction.     Historical Provider, MD    Allergies as of 09/11/2015 - Review Complete 09/07/2015  Allergen Reaction Noted  . Ampicillin Hives and Other (See Comments) 02/19/2015  . Penicillins Hives and Other (See Comments) 09/07/2015    Family History  Problem Relation Age of Onset  .  Hypertension Mother   . Congestive Heart Failure Father     Social History   Social History  . Marital Status: Divorced    Spouse Name: N/A  . Number of Children: N/A  . Years of Education: N/A   Occupational History  . Not on file.   Social History Main Topics  . Smoking status: Former Research scientist (life sciences)  . Smokeless tobacco: Never Used  . Alcohol Use: Not on file  . Drug Use: Not on file  . Sexual Activity: Not on file   Other Topics Concern  . Not on file   Social History Narrative    Review of Systems: See HPI, otherwise negative ROS  Physical Exam: BP 93/52 mmHg  Pulse 108  Temp(Src) 96.8 F (36 C) (Tympanic)  SpO2 96% General:   Alert,  pleasant and cooperative in NAD, on [place] O2 Head:  Normocephalic and atraumatic. Neck:  Supple; no masses or thyromegaly. Lungs:  Clear throughout to auscultation.   Increased expiratory phase bilaterally. Heart:  Regular rate and rhythm. Abdomen:  Soft, nontender and nondistended. Normal bowel sounds, without guarding, and without rebound.   Neurologic:  Alert and  oriented x4;  grossly normal neurologically.  Impression/Plan: [name] is here for an Upper EUS  to be performed for abnormal pancreatic imaging.  Risks, benefits, limitations, and alternatives regarding  Upper EUS  have been reviewed with the patient.  Questions have been answered.  All parties agreeable.   [name], MD  09/20/2015, 2:54 PM

## 2015-09-20 NOTE — Anesthesia Postprocedure Evaluation (Signed)
Anesthesia Post Note  Patient: Earl Clay  Procedure(s) Performed: Procedure(s) (LRB): UPPER ESOPHAGEAL ENDOSCOPIC ULTRASOUND (EUS) (N/A)  Patient location during evaluation: Endoscopy Anesthesia Type: General Level of consciousness: awake and alert Pain management: pain level controlled Vital Signs Assessment: post-procedure vital signs reviewed and stable Respiratory status: spontaneous breathing, nonlabored ventilation, respiratory function stable and patient connected to nasal cannula oxygen Cardiovascular status: blood pressure returned to baseline and stable Postop Assessment: no signs of nausea or vomiting Anesthetic complications: no Comments: Grade 1 View with [place].  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  First attempt by CRNA and 2nd attempt by MD.  We did not feel that down sizing the 7.0 tube would improve our efforts as the tip of the tube would pass the cords but then seemed to run into a visible anatomic wall.  Able to mask ventilate patient.  Patient may require awake intubation in the future.      Last Vitals:  Filed Vitals:   09/20/15 1602 09/20/15 1612  BP: 82/59 96/63  Pulse: 105 102  Temp:    Resp: 20 22    Last Pain: There were no vitals filed for this visit.               Precious Haws Arien Morine

## 2015-09-20 NOTE — OR Nursing (Signed)
16:23  - IV removed from Right Forearm (20-gauge  angiocath).  Site without redness or swelling.  Catheter was intact.  Norlene Campbell, RN

## 2015-09-20 NOTE — OR Nursing (Signed)
Anesthesia unable to intubate patient due to unable to pass vocal cords.Procedure aborted and patient returned to recovery room after stabilization of airway and vital signs

## 2015-09-20 NOTE — Anesthesia Preprocedure Evaluation (Addendum)
Anesthesia Evaluation  Patient identified by MRN, date of birth, ID band Patient awake    Reviewed: Allergy & Precautions, H&P , NPO status , Patient's Chart, lab work & pertinent test results  History of Anesthesia Complications Negative for: history of anesthetic complications  Airway Mallampati: III  TM Distance: <3 FB Neck ROM: limited    Dental  (+) Poor Dentition, Chipped, Missing, Partial Lower, Partial Upper   Pulmonary shortness of breath and with exertion, asthma , sleep apnea and Continuous Positive Airway Pressure Ventilation , COPD,  COPD inhaler and oxygen dependent, former smoker,    Pulmonary exam normal breath sounds clear to auscultation       Cardiovascular Exercise Tolerance: Poor hypertension, (-) angina+ CAD and + DOE  Normal cardiovascular exam Rhythm:regular Rate:Normal     Neuro/Psych PSYCHIATRIC DISORDERS Depression negative neurological ROS     GI/Hepatic Neg liver ROS, GERD  Controlled,  Endo/Other  diabetes, Poorly Controlled, Type 2, Insulin Dependent  Renal/GU Renal disease  negative genitourinary   Musculoskeletal   Abdominal   Peds  Hematology negative hematology ROS (+)   Anesthesia Other Findings Past Medical History:   Lung cancer ([place])                                            Diabetes mellitus without complication (HCC)                 COPD (chronic obstructive pulmonary disease) (*              CAD (coronary artery disease)                                GERD (gastroesophageal reflux disease)                       PTSD (post-traumatic stress disorder)                        Pancreatic mass                                              Hypertension                                                 Asthma                                                       Sleep apnea                                                    Comment:CPAP with O2 2L/m at night   Oxygen dependent  Comment:2-2.5L/m continuously   Depression                                                  Past Surgical History:   KNEE SURGERY                                    Right                Comment:right knee repair   HERNIA REPAIR                                   Right                Comment:right inguinal hernia repair     Reproductive/Obstetrics negative OB ROS                            Anesthesia Physical Anesthesia Plan  ASA: IV  Anesthesia Plan: General ETT   Post-op Pain Management:    Induction:   Airway Management Planned:   Additional Equipment:   Intra-op Plan:   Post-operative Plan:   Informed Consent: I have reviewed the patients History and Physical, chart, labs and discussed the procedure including the risks, benefits and alternatives for the proposed anesthesia with the patient or authorized representative who has indicated his/her understanding and acceptance.   Dental Advisory Given  Plan Discussed with: Anesthesiologist, CRNA and Surgeon  Anesthesia Plan Comments: (Patient informed that they are higher risk for complications from anesthesia during this procedure due to their medical history. Also, increased risk for prolonged intubation. Patient voiced understanding. )        Anesthesia Quick Evaluation

## 2015-09-20 NOTE — OR Nursing (Signed)
15:15 - Dr. Theodore Demark here aware pf elevated BP.  Manual 80/60 in comparison to monitor.  Patient states he feels fine.  Will continue to closely monitor.

## 2015-09-20 NOTE — Brief Op Note (Signed)
Patient was planned for Upper EUS today, with general anesthesia due to his substantial pulmonary comorbidities.  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  Decision was made to abort the procedure, and, Anesthesia recommended ENT consultation/neck imaging.  Please see their notes for full details.  I cancelled the procedure and communicated these events to Dr. Oliva Bustard by phone.  He will be arranging ENT consultation next week.  The patient will be observed until discharge criteria are met.

## 2015-09-26 ENCOUNTER — Encounter: Payer: Self-pay | Admitting: Gastroenterology

## 2015-09-27 ENCOUNTER — Encounter: Payer: Self-pay | Admitting: Oncology

## 2015-09-27 ENCOUNTER — Inpatient Hospital Stay: Payer: Medicare Other | Attending: Oncology | Admitting: Oncology

## 2015-09-27 VITALS — BP 96/62 | HR 105 | Temp 95.6°F | Resp 18 | Wt 162.0 lb

## 2015-09-27 DIAGNOSIS — J449 Chronic obstructive pulmonary disease, unspecified: Secondary | ICD-10-CM | POA: Diagnosis not present

## 2015-09-27 DIAGNOSIS — G4730 Sleep apnea, unspecified: Secondary | ICD-10-CM | POA: Insufficient documentation

## 2015-09-27 DIAGNOSIS — Z7982 Long term (current) use of aspirin: Secondary | ICD-10-CM | POA: Diagnosis not present

## 2015-09-27 DIAGNOSIS — Z794 Long term (current) use of insulin: Secondary | ICD-10-CM | POA: Diagnosis not present

## 2015-09-27 DIAGNOSIS — C259 Malignant neoplasm of pancreas, unspecified: Secondary | ICD-10-CM

## 2015-09-27 DIAGNOSIS — I10 Essential (primary) hypertension: Secondary | ICD-10-CM | POA: Insufficient documentation

## 2015-09-27 DIAGNOSIS — R531 Weakness: Secondary | ICD-10-CM | POA: Insufficient documentation

## 2015-09-27 DIAGNOSIS — R0602 Shortness of breath: Secondary | ICD-10-CM | POA: Diagnosis not present

## 2015-09-27 DIAGNOSIS — I2510 Atherosclerotic heart disease of native coronary artery without angina pectoris: Secondary | ICD-10-CM | POA: Insufficient documentation

## 2015-09-27 DIAGNOSIS — K869 Disease of pancreas, unspecified: Secondary | ICD-10-CM | POA: Diagnosis not present

## 2015-09-27 DIAGNOSIS — Z87891 Personal history of nicotine dependence: Secondary | ICD-10-CM | POA: Insufficient documentation

## 2015-09-27 DIAGNOSIS — E119 Type 2 diabetes mellitus without complications: Secondary | ICD-10-CM | POA: Insufficient documentation

## 2015-09-27 DIAGNOSIS — Z79899 Other long term (current) drug therapy: Secondary | ICD-10-CM | POA: Insufficient documentation

## 2015-09-27 DIAGNOSIS — Z9981 Dependence on supplemental oxygen: Secondary | ICD-10-CM | POA: Diagnosis not present

## 2015-09-27 DIAGNOSIS — J45909 Unspecified asthma, uncomplicated: Secondary | ICD-10-CM | POA: Diagnosis not present

## 2015-09-27 DIAGNOSIS — K8689 Other specified diseases of pancreas: Secondary | ICD-10-CM

## 2015-09-27 DIAGNOSIS — C3412 Malignant neoplasm of upper lobe, left bronchus or lung: Secondary | ICD-10-CM | POA: Insufficient documentation

## 2015-09-27 DIAGNOSIS — J398 Other specified diseases of upper respiratory tract: Secondary | ICD-10-CM | POA: Insufficient documentation

## 2015-09-27 DIAGNOSIS — K219 Gastro-esophageal reflux disease without esophagitis: Secondary | ICD-10-CM | POA: Diagnosis not present

## 2015-09-27 DIAGNOSIS — E785 Hyperlipidemia, unspecified: Secondary | ICD-10-CM | POA: Diagnosis not present

## 2015-09-27 DIAGNOSIS — R5383 Other fatigue: Secondary | ICD-10-CM | POA: Insufficient documentation

## 2015-09-27 DIAGNOSIS — Z7952 Long term (current) use of systemic steroids: Secondary | ICD-10-CM | POA: Insufficient documentation

## 2015-09-27 DIAGNOSIS — R634 Abnormal weight loss: Secondary | ICD-10-CM | POA: Insufficient documentation

## 2015-09-27 NOTE — Progress Notes (Signed)
[place] @ [facility] [phone]  Fax:([phone]) [name]: [birth year]  MR#: [id number]  HKV#:[id number]  Patient Care Team: [name], MD as PCP - General (Internal Medicine)  CHIEF COMPLAINT:  Chief Complaint  Patient presents with  . Pancreatic mass   Carcinoma of lung T1 N1 M0 tumor previously seen by radiation oncologist and radiation therapy approximately March of 2015  Significant weight loss VISIT DIAGNOSIS:   No diagnosis found.    No history exists.    Oncology Flowsheet 09/07/2015 09/08/2015 09/08/2015 09/09/2015  methylPREDNISolone sodium succinate 125 mg/2 mL (SOLU-MEDROL) IV 60 mg 60 mg 60 mg 60 mg    INTERVAL HISTORY: patient is a pleasant 75 year old male oxygen dependent with long-standing history of CPD, his pulmonologist have a left upper lobe mass. CT scan showed a 3 cm mass in the left hilar region. Underwent PET/CT scan showed hypermetabolic activity in the left upper lobe mass no associated mediastinal or hilar adenopathy. No other disease outside the chest is noted. CT-guided biopsy was positive for non-small cell lung cancer. Patient has been declined from surgery by surgical oncology based on significant comorbidities with FEV1 of 40% of predicted. Patient's been on oxygen therapy for past 4-5 years. He is seen today for consideration of radiation therapy options. He is also seeing medical oncology. He specifically denies significant cough hemoptysis or chest tightness..  Patient was scheduled for endoscopy, ultrasound however patient could not be intubated.  I was called to inform that either patient has obstructing lesion in trachea or has tracheomalacia  Patient Friends  WHO   accompanies him suggested that he is losing weight.  He is not active He does not report any abdominal pain.  No nausea.  No vomiting.  No diarrhea. REVIEW OF SYSTEMS:    general status: Patient is feeling weak and tired.  No  change in a performance status.  No chills.  No fever. Significant weight loss  as mentioned above HEENT:  No evidence of stomatitis.   No  headache Lungs:  Has increasing shortness of breath on oxygen he has been on oxygen for several months   has a history COPD Cardiac: No chest pain or paroxysmal nocturnal dyspnea GI: No nausea no vomiting no diarrhea no abdominal pain Skin: No rash Lower extremity no swelling Neurological system: No tingling.  No numbness.  No other focal signs Musculoskeletal system no bony pains As mentioned about patient could not get endoscopy ultrasound to evaluate pancreatic mass   As per HPI. Otherwise, a complete review of systems is negatve.  PAST MEDICAL HISTORY: Past Medical History  Diagnosis Date  . Lung cancer ([place])   . Diabetes mellitus without complication ([place])   . COPD (chronic obstructive pulmonary disease) ([place])   . CAD (coronary artery disease)   . GERD (gastroesophageal reflux disease)   . PTSD (post-traumatic stress disorder)   . Pancreatic mass   . Hypertension   . Asthma   . Sleep apnea     CPAP with O2 2L/m at night  . Oxygen dependent     2-2.5L/m continuously  . Depression   . Difficult intubation     Expand All Collapse All   Reason for Visit: This 75 year old Male patient presents to the clinic for initial evaluation of lung cancer .   Referred by Dr. Faith Rogue.  Diagnosis:  Chief Complaint/Diagnosis   75 year old male with a two-way (T2, N0, M0)non-small cell lung cancer of the  left lung nonoperable by multiple medical comorbidities.                Past Hx:   Hypertension:   Orthopnea:   Asthma:   CPAP with O2 2 L/M at night:   O2 @ 2- 2 1/2 L/M Continuously:   Depression:   Hyperlipidemia:   PTSD:   Emphysema:   COPD:   Sleep Apnea:   Diabetes:   UPPP:   Right knee repair:   Right Linguinal hernis repair:   Past,  Family and Social History:  Past Medical History positive   Cardiovascular hyperlipidemia   Respiratory asthma; COPD; oxygen dependent, emphysema, sleep apnea   Endocrine diabetes mellitus   Past Surgical History right knee repair, right herniorrhaphy repair   Past Medical History Comments posttraumatic stress disorder   Family History noncontributory   Social History positive   Social History Comments 150-pack-year smoking history quit smoking 4 years prior no EtOH abuse history   Additional Past Medical and Surgical History accompanied by his wife today   Allergies:  Ampicillin: Rash PCN: Rash Zyban: Rash        ADVANCED DIRECTIVES:   Patient does have advance healthcare directive, Patient   does not desire to make any changes      HEALTH MAINTENANCE: Social History  Substance Use Topics  . Smoking status: Former Research scientist (life sciences)  . Smokeless tobacco: Never Used  . Alcohol Use: None    Allergies  Allergen Reactions  . Ampicillin Hives and Other (See Comments)    Has patient had a PCN reaction causing immediate rash, facial/tongue/throat swelling, SOB or lightheadedness with hypotension: No Has patient had a PCN reaction causing severe rash involving mucus membranes or skin necrosis: No Has patient had a PCN reaction that required hospitalization No Has patient had a PCN reaction occurring within the last 10 years: No If all of the above answers are "NO", then may proceed with Cephalosporin use.  Marland Kitchen Penicillins Hives and Other (See Comments)    Has patient had a PCN reaction causing immediate rash, facial/tongue/throat swelling, SOB or lightheadedness with hypotension: No Has patient had a PCN reaction causing severe rash involving mucus membranes or skin necrosis: No Has patient had a PCN reaction that required hospitalization No Has patient had a PCN reaction occurring within the last 10 years: No If all of the above answers  are "NO", then may proceed with Cephalosporin use.  . Zyban [Bupropion] Rash    Current Outpatient Prescriptions  Medication Sig Dispense Refill  . albuterol (PROVENTIL HFA;VENTOLIN HFA) 108 (90 Base) MCG/ACT inhaler Inhale 2 puffs into the lungs every 6 (six) hours as needed for wheezing or shortness of breath.    Marland Kitchen albuterol (PROVENTIL) (2.5 MG/3ML) 0.083% nebulizer solution Take 2.5 mg by nebulization every 4 (four) hours as needed for wheezing or shortness of breath.     Marland Kitchen aspirin EC 81 MG tablet Take 81 mg by mouth daily.    . cetirizine (ZYRTEC) 10 MG tablet Take 10 mg by mouth daily.     . citalopram (CELEXA) 20 MG tablet Take 20 mg by mouth daily.     Marland Kitchen diltiazem (DILACOR XR) 180 MG 24 hr capsule Take 180 mg by mouth daily.    . furosemide (LASIX) 20 MG tablet Take 20 mg by mouth daily.     Marland Kitchen HYDROcodone-homatropine (HYCODAN) 5-1.5 MG/5ML syrup Take 5 mLs by mouth every 6 (six) hours as needed for cough.    . hydrOXYzine (ATARAX/VISTARIL) 10  MG tablet Take 10 mg by mouth at bedtime.    . insulin aspart (NOVOLOG) 100 UNIT/ML injection Inject 2-28 Units into the skin 3 (three) times daily with meals as needed for high blood sugar. Pt uses as needed per sliding scale.    . insulin detemir (LEVEMIR) 100 UNIT/ML injection Inject 20-40 Units into the skin 2 (two) times daily. Pt uses 20 units in the morning and 40 units at night.    . magnesium oxide (MAG-OX) 400 MG tablet Take 400 mg by mouth daily.     . pantoprazole (PROTONIX) 40 MG tablet Take 40 mg by mouth 2 (two) times daily before a meal.     . PARoxetine (PAXIL) 40 MG tablet Take 40 mg by mouth 2 (two) times daily.     . pravastatin (PRAVACHOL) 80 MG tablet Take 80 mg by mouth at bedtime.     . predniSONE (DELTASONE) 50 MG tablet Take 1 tablet (50 mg total) by mouth daily with breakfast. 4 tablet 0  . sildenafil (VIAGRA) 100 MG tablet Take 100 mg by mouth daily as needed for erectile dysfunction.     . tamsulosin (FLOMAX) 0.4 MG  CAPS capsule Take 0.4 mg by mouth at bedtime.    . theophylline (THEO-24) 300 MG 24 hr capsule Take 300 mg by mouth daily.    Marland Kitchen tiotropium (SPIRIVA) 18 MCG inhalation capsule Place 18 mcg into inhaler and inhale daily.     Marland Kitchen tiZANidine (ZANAFLEX) 4 MG tablet Take 4 mg by mouth at bedtime.    . traZODone (DESYREL) 100 MG tablet Take 100 mg by mouth at bedtime.    Marland Kitchen zolpidem (AMBIEN) 5 MG tablet Take by mouth.    Marland Kitchen BREO ELLIPTA 100-25 MCG/INH AEPB     . enalapril (VASOTEC) 5 MG tablet TAKE 1 TABLET (5 MG TOTAL) BY MOUTH ONCE DAILY.  1  . hydrochlorothiazide (HYDRODIURIL) 25 MG tablet TAKE 1 TABLET (25 MG TOTAL) BY MOUTH ONCE DAILY.  1   No current facility-administered medications for this visit.    OBJECTIVE: PHYSICAL EXAM: Gen. Status: Patient is alert oriented in mild respiratory distress has oxygen dependent. Lymphatic system: Supraclavicular, cervical, axillary, inguinal lymph nodes are not palpable Head exam was generally normal. There was no scleral icterus or corneal arcus. Mucous membranes were moist. Lungs: Emphysematous chest.  Rhonchi on both sides. Cardiac: Soft systolic murmur.  Normal heart sounds Abdomen: Protuberant.  No ascites.  Spleen and liver not palpable Lower extremities trace edema Skin: No rash Neurological system no localizing signs Musculoskeletal system no abnormality All other systems have been reviewed and no positive findings  Filed Vitals:   09/27/15 1435  BP: 96/62  Pulse: 105  Temp: 95.6 F (35.3 C)  Resp: 18     Body mass index is 26.16 kg/(m^2).    ECOG FS:2 - Symptomatic, <50% confined to bed  LAB RESULTS:  No visits with results within 5 Day(s) from this visit. Latest known visit with results is:  Admission on 09/20/2015, Discharged on 09/20/2015  Component Date Value Ref Range Status  . Glucose-Capillary 09/20/2015 108* 65 - 99 mg/dL Final  . Comment 1 09/20/2015 Notify RN   Final     STUDIES: Dg Chest 2 View  09/07/2015  CLINICAL  DATA:  Cough and congestion for several days. History of COPD. EXAM: CHEST  2 VIEW COMPARISON:  Chest CT, 07/27/2015 FINDINGS: Cardiac silhouette is normal in size and configuration. No mediastinal or right hilar masses or evidence of  adenopathy. There is distortion and prominence of the left hilum consistent with previous radiation therapy scarring for lung carcinoma. Mild volume loss on the left. There is some prominent fat at the left anterior lung base, stable. There are reticular nodular opacities in the left upper lobe peripherally. The more focal pleural based opacity noted in the posterior lateral left upper lobe on the prior CT is not seen on the standard radiographs. The reticular nodular opacity is stable from the exam. Right lung is hyperexpanded. No right lung mass or suspicious nodule. No lung consolidation or edema. No pleural effusion or pneumothorax. Bony thorax is demineralized but intact. IMPRESSION: 1. No acute cardiopulmonary disease. 2. Chronic changes on the left from treatment previous lung carcinoma. Left upper lobe reticular nodular opacities are also likely scarring similar to the prior chest CT. Electronically Signed   By: Lajean Manes M.D.   On: 09/07/2015 16:11   US Renal  09/07/2015  CLINICAL DATA:  Acute renal failure EXAM: RENAL / URINARY TRACT ULTRASOUND COMPLETE COMPARISON:  08/27/2015 FINDINGS: Right Kidney: Length: 10.9 cm. 19 mm lateral lower pole hypoechoic lesion likely a small cyst. This is seen on CT scan performed 08/27/2015 as a nonenhancing structure and it is stable from 03/27/2015 in size. Left Kidney: Length: 11.3 cm. Echogenicity within normal limits. No mass or hydronephrosis visualized. Bladder: Appears normal for degree of bladder distention. IMPRESSION: No acute findings.  Small right renal cyst. Electronically Signed   By: Skipper Cliche M.D.   On: 09/07/2015 18:39    ASSESSMENT:   Previous history of non-small cell carcinoma of lung in the patient was a  severe COPD with oxygen-dependent also is diabetic. Was left upper lobe tumor O Regina Lilly's clinically stage   T2 N1 M0 tumor based on PET scan was   NOT OPERABLE AS PATIENT'S PULMONARY FUNCTIONS WERE VERY POOR    2.  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  Will get ENT physician to evaluate that. We will discuss case in tumor conference to see if needle biopsy of pancreatic mass can be done Evaluate patient after ENT evaluation is done

## 2015-10-03 ENCOUNTER — Other Ambulatory Visit: Payer: Self-pay | Admitting: Unknown Physician Specialty

## 2015-10-03 ENCOUNTER — Ambulatory Visit: Payer: Medicare Other | Attending: Radiation Oncology | Admitting: Radiation Oncology

## 2015-10-03 DIAGNOSIS — C3490 Malignant neoplasm of unspecified part of unspecified bronchus or lung: Secondary | ICD-10-CM

## 2015-10-03 DIAGNOSIS — J386 Stenosis of larynx: Secondary | ICD-10-CM

## 2015-10-03 DIAGNOSIS — J988 Other specified respiratory disorders: Secondary | ICD-10-CM

## 2015-10-05 ENCOUNTER — Ambulatory Visit
Admission: RE | Admit: 2015-10-05 | Discharge: 2015-10-05 | Disposition: A | Discharge status: Home / Self Care | Payer: Medicare Other | Source: Ambulatory Visit | Attending: Unknown Physician Specialty | Admitting: Unknown Physician Specialty

## 2015-10-05 ENCOUNTER — Other Ambulatory Visit: Payer: Self-pay | Admitting: Unknown Physician Specialty

## 2015-10-05 DIAGNOSIS — Z923 Personal history of irradiation: Secondary | ICD-10-CM | POA: Diagnosis not present

## 2015-10-05 DIAGNOSIS — J439 Emphysema, unspecified: Secondary | ICD-10-CM | POA: Insufficient documentation

## 2015-10-05 DIAGNOSIS — I2510 Atherosclerotic heart disease of native coronary artery without angina pectoris: Secondary | ICD-10-CM | POA: Diagnosis not present

## 2015-10-05 DIAGNOSIS — Y842 Radiological procedure and radiotherapy as the cause of abnormal reaction of the patient, or of later complication, without mention of misadventure at the time of the procedure: Secondary | ICD-10-CM | POA: Diagnosis not present

## 2015-10-05 DIAGNOSIS — C3490 Malignant neoplasm of unspecified part of unspecified bronchus or lung: Secondary | ICD-10-CM | POA: Insufficient documentation

## 2015-10-05 DIAGNOSIS — J386 Stenosis of larynx: Secondary | ICD-10-CM

## 2015-10-05 LAB — POCT I-STAT CREATININE: CREATININE: 3.4 mg/dL — AB (ref 0.61–1.24)

## 2015-10-11 ENCOUNTER — Other Ambulatory Visit: Payer: Self-pay | Admitting: Internal Medicine

## 2015-10-11 ENCOUNTER — Telehealth: Payer: Self-pay | Admitting: Internal Medicine

## 2015-10-11 NOTE — Telephone Encounter (Signed)
Pt notified of MD recommendations. Informed pt that we will call him back when scan has been scheduled after discussing with Dr. Oliva Bustard.

## 2015-10-11 NOTE — Telephone Encounter (Signed)
Imaging discussed at the tumor conference on 10/11/2015- imaging s/o of Pancreatitis; recommend CT scan in 4 weeks from last scan if clinically feasible [renal failure] or follow up ca-19-9/lipase. Dr.McQueen did not see any anatomic reason why patient could not be intubated.   Hayley, please inform family that we will wait for EUS for now; plan imaging if ok with Dr.Choksi. Thx

## 2015-10-25 ENCOUNTER — Other Ambulatory Visit: Payer: Self-pay | Admitting: *Deleted

## 2015-10-25 DIAGNOSIS — K8689 Other specified diseases of pancreas: Secondary | ICD-10-CM

## 2015-10-31 ENCOUNTER — Other Ambulatory Visit: Payer: Self-pay | Admitting: *Deleted

## 2015-10-31 DIAGNOSIS — K8689 Other specified diseases of pancreas: Secondary | ICD-10-CM

## 2015-11-01 ENCOUNTER — Ambulatory Visit
Admission: RE | Admit: 2015-11-01 | Discharge: 2015-11-01 | Disposition: A | Discharge status: Home / Self Care | Payer: Medicare Other | Source: Ambulatory Visit | Attending: Oncology | Admitting: Oncology

## 2015-11-01 ENCOUNTER — Other Ambulatory Visit: Payer: Self-pay | Admitting: Oncology

## 2015-11-01 DIAGNOSIS — I2510 Atherosclerotic heart disease of native coronary artery without angina pectoris: Secondary | ICD-10-CM | POA: Insufficient documentation

## 2015-11-01 DIAGNOSIS — K869 Disease of pancreas, unspecified: Secondary | ICD-10-CM | POA: Diagnosis present

## 2015-11-01 DIAGNOSIS — K8689 Other specified diseases of pancreas: Secondary | ICD-10-CM

## 2015-11-01 DIAGNOSIS — R918 Other nonspecific abnormal finding of lung field: Secondary | ICD-10-CM | POA: Insufficient documentation

## 2015-11-01 DIAGNOSIS — K429 Umbilical hernia without obstruction or gangrene: Secondary | ICD-10-CM | POA: Diagnosis not present

## 2015-11-01 DIAGNOSIS — K859 Acute pancreatitis without necrosis or infection, unspecified: Secondary | ICD-10-CM | POA: Insufficient documentation

## 2015-11-01 DIAGNOSIS — J432 Centrilobular emphysema: Secondary | ICD-10-CM | POA: Insufficient documentation

## 2015-11-01 DIAGNOSIS — N400 Enlarged prostate without lower urinary tract symptoms: Secondary | ICD-10-CM | POA: Insufficient documentation

## 2015-11-07 ENCOUNTER — Inpatient Hospital Stay: Payer: Medicare Other | Attending: Oncology | Admitting: Oncology

## 2015-11-07 ENCOUNTER — Encounter: Payer: Self-pay | Admitting: Oncology

## 2015-11-07 VITALS — BP 114/71 | HR 106 | Temp 96.5°F | Wt 160.4 lb

## 2015-11-07 DIAGNOSIS — Z794 Long term (current) use of insulin: Secondary | ICD-10-CM | POA: Diagnosis not present

## 2015-11-07 DIAGNOSIS — Z7982 Long term (current) use of aspirin: Secondary | ICD-10-CM | POA: Diagnosis not present

## 2015-11-07 DIAGNOSIS — Z79899 Other long term (current) drug therapy: Secondary | ICD-10-CM | POA: Insufficient documentation

## 2015-11-07 DIAGNOSIS — R0600 Dyspnea, unspecified: Secondary | ICD-10-CM | POA: Insufficient documentation

## 2015-11-07 DIAGNOSIS — J449 Chronic obstructive pulmonary disease, unspecified: Secondary | ICD-10-CM | POA: Diagnosis not present

## 2015-11-07 DIAGNOSIS — R630 Anorexia: Secondary | ICD-10-CM | POA: Diagnosis not present

## 2015-11-07 DIAGNOSIS — R634 Abnormal weight loss: Secondary | ICD-10-CM | POA: Diagnosis not present

## 2015-11-07 DIAGNOSIS — E785 Hyperlipidemia, unspecified: Secondary | ICD-10-CM | POA: Diagnosis not present

## 2015-11-07 DIAGNOSIS — Z87891 Personal history of nicotine dependence: Secondary | ICD-10-CM | POA: Diagnosis not present

## 2015-11-07 DIAGNOSIS — Z9981 Dependence on supplemental oxygen: Secondary | ICD-10-CM | POA: Insufficient documentation

## 2015-11-07 DIAGNOSIS — K869 Disease of pancreas, unspecified: Secondary | ICD-10-CM | POA: Diagnosis not present

## 2015-11-07 DIAGNOSIS — I2510 Atherosclerotic heart disease of native coronary artery without angina pectoris: Secondary | ICD-10-CM | POA: Insufficient documentation

## 2015-11-07 DIAGNOSIS — R5383 Other fatigue: Secondary | ICD-10-CM

## 2015-11-07 DIAGNOSIS — I10 Essential (primary) hypertension: Secondary | ICD-10-CM | POA: Insufficient documentation

## 2015-11-07 DIAGNOSIS — F329 Major depressive disorder, single episode, unspecified: Secondary | ICD-10-CM | POA: Diagnosis not present

## 2015-11-07 DIAGNOSIS — R531 Weakness: Secondary | ICD-10-CM | POA: Diagnosis not present

## 2015-11-07 DIAGNOSIS — C3412 Malignant neoplasm of upper lobe, left bronchus or lung: Secondary | ICD-10-CM | POA: Diagnosis present

## 2015-11-07 DIAGNOSIS — K219 Gastro-esophageal reflux disease without esophagitis: Secondary | ICD-10-CM | POA: Insufficient documentation

## 2015-11-07 DIAGNOSIS — E119 Type 2 diabetes mellitus without complications: Secondary | ICD-10-CM | POA: Insufficient documentation

## 2015-11-07 DIAGNOSIS — G4730 Sleep apnea, unspecified: Secondary | ICD-10-CM | POA: Diagnosis not present

## 2015-11-07 DIAGNOSIS — C3490 Malignant neoplasm of unspecified part of unspecified bronchus or lung: Secondary | ICD-10-CM

## 2015-11-07 MED ORDER — AZITHROMYCIN 500 MG PO TABS: 500.0000 mg | ORAL_TABLET | Freq: Every day | ORAL | Status: DC

## 2015-11-07 NOTE — Progress Notes (Signed)
Patient here for CT results today.

## 2015-11-08 ENCOUNTER — Encounter: Payer: Self-pay | Admitting: Oncology

## 2015-11-08 NOTE — Progress Notes (Signed)
[place] @ [facility] [phone]  Fax:([phone]) [place]: 1940/07/20  MR#: [id number]  XNT#:[id number]  Patient Care Team: [name], MD as PCP - General (Internal Medicine)  CHIEF COMPLAINT:  Chief Complaint  Patient presents with  . Pancreatic Cancer   Carcinoma of lung T1 N1 M0 tumor previously seen by radiation oncologist and radiation therapy approximately March of 2015.  2.Patient was evaluated because of abnormal CT scan  dated March 15 of 2016A she had a 6 mm right upper lobe pulmonary nodule which has likely an largest and I will up since last CT scan of 2014 Patient has previously treated left lung cancer with radiation changes 3.  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  Repeat CT scan revealed complete resolution of  Pancreatic  Mass. (April of 2017)  Significant weight loss VISIT DIAGNOSIS:     ICD-9-CM ICD-10-CM   1. Malignant neoplasm of lung, unspecified laterality, unspecified part of lung (HCC) 162.9 C34.90 CBC with Differential     Comprehensive metabolic panel     CT Chest Wo Contrast     azithromycin (ZITHROMAX) 500 MG tablet      No history exists.    Oncology Flowsheet 09/07/2015 09/08/2015 09/08/2015 09/09/2015  methylPREDNISolone sodium succinate 125 mg/2 mL (SOLU-MEDROL) IV 60 mg 60 mg 60 mg 60 mg    INTERVAL HISTORY: patient is a pleasant 75 year old male oxygen dependent with long-standing history of CPD, his pulmonologist have a left upper lobe mass. CT scan showed a 3 cm mass in the left hilar region. Underwent PET/CT scan showed hypermetabolic activity in the left upper lobe mass no associated mediastinal or hilar adenopathy. No other disease outside the chest is noted. CT-guided biopsy was positive for non-small cell lung cancer. Patient has been declined from surgery by surgical oncology based on significant comorbidities with FEV1 of 40% of predicted.  Patient's been on oxygen therapy for past 4-5 years. He is seen today for consideration of radiation therapy options. He is also seeing medical oncology. He specifically denies significant cough hemoptysis or chest tightness...  Patient continues to have poor appetite and loss of weight.  Had difficulty in intubation but evaluation by ENT surgeon and repeat CT scan did not reveal any tracheal mass.  Patient is here for further evaluation and treatment consideration   REVIEW OF SYSTEMS:    general status: Patient is feeling weak and tired.  No change in a performance status.  No chills.  No fever. Significant weight loss  as mentioned above HEENT:  No evidence of stomatitis.   No  headache Lungs:  Has increasing shortness of breath on oxygen he has been on oxygen for several months   has a history COPD Cardiac: No chest pain or paroxysmal nocturnal dyspnea GI: No nausea no vomiting no diarrhea no abdominal pain Skin: No rash Lower extremity no swelling Neurological system: No tingling.  No numbness.  No other focal signs Musculoskeletal system no bony pains As mentioned about patient could not get endoscopy ultrasound to evaluate pancreatic mass   As per HPI. Otherwise, a complete review of systems is negatve.  PAST MEDICAL HISTORY: Past Medical History  Diagnosis Date  . Lung cancer ([place])   . Diabetes mellitus without complication ([place])   . COPD (chronic obstructive pulmonary disease) ([place])   . CAD (coronary artery disease)   . GERD (gastroesophageal reflux disease)   . PTSD (post-traumatic stress  disorder)   . Pancreatic mass   . Hypertension   . Asthma   . Sleep apnea     CPAP with O2 2L/m at night  . Oxygen dependent     2-2.5L/m continuously  . Depression   . Difficult intubation       r .     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                Past Hx:   Hypertension:   Orthopnea:   Asthma:   CPAP with O2 2 L/M at night:   O2 @ 2- 2 1/2 L/M Continuously:   Depression:   Hyperlipidemia:   PTSD:   Emphysema:   COPD:   Sleep Apnea:   Diabetes:   UPPP:   Right knee repair:   Right Linguinal hernis repair:   Past, Family and Social History:  Past Medical History positive   Cardiovascular hyperlipidemia   Respiratory asthma; COPD; oxygen dependent, emphysema, sleep apnea   Endocrine diabetes mellitus   Past Surgical History right knee repair, right herniorrhaphy repair   Past Medical History Comments posttraumatic stress disorder   Family History noncontributory   Social History positive   Social History Comments 150-pack-year smoking history quit smoking 4 years prior no EtOH abuse history   Additional Past Medical and Surgical History accompanied by his wife today   Allergies:  Ampicillin: Rash PCN: Rash Zyban: Rash        ADVANCED DIRECTIVES:   Patient does have advance healthcare directive, Patient   does not desire to make any changes      HEALTH MAINTENANCE: Social History  Substance Use Topics  . Smoking status: Former Research scientist (life sciences)  . Smokeless tobacco: Never Used  . Alcohol Use: None    Allergies  Allergen Reactions  . Ampicillin Hives and Other (See Comments)    Has patient had a PCN reaction causing immediate rash, facial/tongue/throat swelling, SOB or lightheadedness with hypotension: No Has patient had a PCN reaction causing severe rash involving mucus membranes or skin necrosis: No Has patient had a PCN reaction that required hospitalization No Has patient had a PCN reaction occurring within the last 10 years: No If all of the above answers are "NO", then may proceed with Cephalosporin use.  Marland Kitchen Penicillins Hives and Other (See  Comments)    Has patient had a PCN reaction causing immediate rash, facial/tongue/throat swelling, SOB or lightheadedness with hypotension: No Has patient had a PCN reaction causing severe rash involving mucus membranes or skin necrosis: No Has patient had a PCN reaction that required hospitalization No Has patient had a PCN reaction occurring within the last 10 years: No If all of the above answers are "NO", then may proceed with Cephalosporin use.  . Zyban [Bupropion] Rash    Current Outpatient Prescriptions  Medication Sig Dispense Refill  . albuterol (PROVENTIL HFA;VENTOLIN HFA) 108 (90 Base) MCG/ACT inhaler Inhale 2 puffs into the lungs every 6 (six) hours as needed for wheezing or shortness of breath.    Marland Kitchen albuterol (PROVENTIL) (2.5 MG/3ML) 0.083% nebulizer solution Take 2.5 mg by nebulization every 4 (four) hours as needed for wheezing or shortness of breath.     Marland Kitchen aspirin EC 81 MG tablet Take 81 mg by mouth daily.    Marland Kitchen BREO ELLIPTA 100-25 MCG/INH AEPB     . cetirizine (ZYRTEC) 10  MG tablet Take 10 mg by mouth daily.     . citalopram (CELEXA) 20 MG tablet Take 20 mg by mouth daily.     Marland Kitchen diltiazem (DILACOR XR) 180 MG 24 hr capsule Take 180 mg by mouth daily.    . enalapril (VASOTEC) 5 MG tablet TAKE 1 TABLET (5 MG TOTAL) BY MOUTH ONCE DAILY.  1  . furosemide (LASIX) 20 MG tablet Take 20 mg by mouth daily.     . hydrochlorothiazide (HYDRODIURIL) 25 MG tablet TAKE 1 TABLET (25 MG TOTAL) BY MOUTH ONCE DAILY.  1  . HYDROcodone-homatropine (HYCODAN) 5-1.5 MG/5ML syrup Take 5 mLs by mouth every 6 (six) hours as needed for cough.    . hydrOXYzine (ATARAX/VISTARIL) 10 MG tablet Take 10 mg by mouth at bedtime.    . insulin aspart (NOVOLOG) 100 UNIT/ML injection Inject 2-28 Units into the skin 3 (three) times daily with meals as needed for high blood sugar. Pt uses as needed per sliding scale.    . insulin detemir (LEVEMIR) 100 UNIT/ML injection Inject 20-40 Units into the skin 2 (two) times  daily. Pt uses 20 units in the morning and 40 units at night.    . magnesium oxide (MAG-OX) 400 MG tablet Take 400 mg by mouth daily.     . pantoprazole (PROTONIX) 40 MG tablet Take 40 mg by mouth 2 (two) times daily before a meal.     . PARoxetine (PAXIL) 40 MG tablet Take 40 mg by mouth 2 (two) times daily.     . pravastatin (PRAVACHOL) 80 MG tablet Take 80 mg by mouth at bedtime.     . predniSONE (DELTASONE) 50 MG tablet Take 1 tablet (50 mg total) by mouth daily with breakfast. 4 tablet 0  . sildenafil (VIAGRA) 100 MG tablet Take 100 mg by mouth daily as needed for erectile dysfunction.     . tamsulosin (FLOMAX) 0.4 MG CAPS capsule Take 0.4 mg by mouth at bedtime.    . theophylline (THEO-24) 300 MG 24 hr capsule Take 300 mg by mouth daily.    Marland Kitchen tiotropium (SPIRIVA) 18 MCG inhalation capsule Place 18 mcg into inhaler and inhale daily.     Marland Kitchen tiZANidine (ZANAFLEX) 4 MG tablet Take 4 mg by mouth at bedtime.    . traZODone (DESYREL) 100 MG tablet Take 100 mg by mouth at bedtime.    Marland Kitchen azithromycin (ZITHROMAX) 500 MG tablet Take 1 tablet (500 mg total) by mouth daily. 5 tablet 0  . zolpidem (AMBIEN) 5 MG tablet Take by mouth.     No current facility-administered medications for this visit.    OBJECTIVE: PHYSICAL EXAM: Gen. Status: Patient is alert oriented in mild respiratory distress has oxygen dependent. Lymphatic system: Supraclavicular, cervical, axillary, inguinal lymph nodes are not palpable Head exam was generally normal. There was no scleral icterus or corneal arcus. Mucous membranes were moist. Lungs: Emphysematous chest.  Rhonchi on both sides. Cardiac: Soft systolic murmur.  Normal heart sounds Abdomen: Protuberant.  No ascites.  Spleen and liver not palpable Lower extremities trace edema Skin: No rash Neurological system no localizing signs Musculoskeletal system no abnormality All other systems have been reviewed and no positive findings  Filed Vitals:   11/07/15 1138  BP:  114/71  Pulse: 106  Temp: 96.5 F (35.8 C)     Body mass index is 25.91 kg/(m^2).    ECOG FS:2 - Symptomatic, <50% confined to bed  LAB RESULTS:  No visits with results within 5 Day(s) from  this visit. Latest known visit with results is:  Hospital Outpatient Visit on 10/05/2015  Component Date Value Ref Range Status  . Creatinine, Ser 10/05/2015 3.40* 0.61 - 1.24 mg/dL Final     STUDIES: Ct Abdomen Pelvis Wo Contrast  11/01/2015  CLINICAL DATA:  Left upper lobe non-small cell lung carcinoma diagnosed 09/08/2013 treated with radiation therapy. Patient presents for follow-up of a masslike focus in the uncinate process of the pancreas on recent PET-CT and CT abdomen studies. EXAM: CT ABDOMEN AND PELVIS WITHOUT CONTRAST TECHNIQUE: Multidetector CT imaging of the abdomen and pelvis was performed following the standard protocol without IV contrast. COMPARISON:  08/27/2015 CT abdomen and 08/15/2015 PET-CT. FINDINGS: Lower chest: Centrilobular emphysema is present at the lung bases. There is a focus of consolidation in the peripheral basilar right lower lobe and there is mild patchy consolidation in the basilar left lower lobe, both new since 10/05/2015, favor infection. Stable complete right middle lobe atelectasis. Right coronary atherosclerosis. Hepatobiliary: Normal liver with no liver mass. Normal gallbladder with no radiopaque cholelithiasis. No biliary ductal dilatation. Pancreas: No main pancreatic duct dilation. No discrete pancreatic mass on this noncontrast CT. The uncinate process of the pancreas and pancreatic head appear within normal limits and appears indistinguishable from the 03/26/2007 unenhanced CT abdomen study. The findings described within the uncinate process of the pancreas on the 08/15/2015 PET-CT and 08/27/2015 CT studies are favored to represent the sequela of resolved acute pancreatitis. No residual peripancreatic fat stranding or fluid collections. Spleen: Normal size. No  mass. Adrenals/Urinary Tract: Adrenals appear stable and within normal limits, with no discrete adrenal nodules. No hydronephrosis. No renal stones. Exophytic simple 1.0 cm renal cyst in the posterior interpolar right kidney. No new contour deforming renal lesions. Normal bladder. Stomach/Bowel: Grossly normal stomach. Normal caliber small bowel with no small bowel wall thickening. Appendix is either diminutive or surgically absent. Moderate stool throughout the colon. No large bowel wall thickening or pericolonic fat stranding. Vascular/Lymphatic: Atherosclerotic nonaneurysmal abdominal aorta. No pathologically enlarged lymph nodes in the abdomen or pelvis. Reproductive: Stable mild prostatomegaly. Other: No pneumoperitoneum, ascites or focal fluid collection. Stable small fat containing umbilical hernia. Musculoskeletal: No aggressive appearing focal osseous lesions. Moderate degenerative changes in the visualized thoracolumbar spine. IMPRESSION: 1. Pancreas appears normal on this noncontrast CT, with no main pancreatic duct dilation and no evidence of a pancreatic mass, although evaluation is significantly limited by the absence of IV contrast. The changes described in the pancreatic head on the 08/15/2015 PET-CT study likely represented acute pancreatitis, which has resolved. If there is continued clinical concern for a pancreatic mass, MRI abdomen without and with IV contrast is recommended. 2. No evidence of metastatic disease in the abdomen or pelvis. 3. New mild patchy consolidation in the basilar lower lobes bilaterally, new since 10/05/2015, probably representing a mild multifocal pneumonia. Recommend attention on follow-up chest CT in 3 months. 4. Additional findings include centrilobular emphysema, coronary atherosclerosis, mild prostatomegaly and small fat containing umbilical hernia. Electronically Signed   By: Ilona Sorrel M.D.   On: 11/01/2015 09:17    ASSESSMENT:   Previous history of non-small  cell carcinoma of lung in the patient was a severe COPD with oxygen-dependent also is diabetic. Was left upper lobe tumor O Regina Lilly's clinically stage   T2 N1 M0 tumor based on PET scan was   NOT OPERABLE AS PATIENT'S PULMONARY FUNCTIONS WERE VERY POOR    2.  Recent evaluation revealed a suspected pancreatic mass Please slightly elevated  CA 19-9 Repeat CT scan has been reviewed independently (dated April of 2017) Pancreatic mass is completely resolves suggesting etiology could be pancreatitis  2.  Repeat CT scan of chest continues to show table 9 mm right upper lobe mass which very well could be second primary or metastases from the previous malignancy  I had prolonged discussion with patient and family I do not believe that 9 mm mass is contributing to any of patient's symptoms  Patient has a multiple other underlying medical problems  Lung nodule is 3 followed.  Another CT scan without contrast has been scheduled in next few months if tumor continues to road and possibility of radiation evaluation can be planned.  This tiny nodules cannot be biopsied or patient is not a great candidate for bronchoscopy   2.  CT scan of the head and neck soft tissue has been evaluated independently and does not show any evidence of tracheal mass.  Patient will be followed in next few months by my associate

## 2016-02-07 ENCOUNTER — Other Ambulatory Visit: Payer: Medicare Other

## 2016-02-07 ENCOUNTER — Ambulatory Visit: Payer: Medicare Other | Admitting: Internal Medicine

## 2016-02-08 ENCOUNTER — Ambulatory Visit
Admission: RE | Admit: 2016-02-08 | Discharge: 2016-02-08 | Disposition: A | Discharge status: Home / Self Care | Payer: Medicare Other | Source: Ambulatory Visit | Attending: Oncology | Admitting: Oncology

## 2016-02-08 ENCOUNTER — Other Ambulatory Visit: Payer: Self-pay | Admitting: *Deleted

## 2016-02-08 DIAGNOSIS — C3490 Malignant neoplasm of unspecified part of unspecified bronchus or lung: Secondary | ICD-10-CM | POA: Diagnosis present

## 2016-02-08 DIAGNOSIS — R918 Other nonspecific abnormal finding of lung field: Secondary | ICD-10-CM | POA: Diagnosis not present

## 2016-02-08 DIAGNOSIS — Z85118 Personal history of other malignant neoplasm of bronchus and lung: Secondary | ICD-10-CM

## 2016-02-08 DIAGNOSIS — I700 Atherosclerosis of aorta: Secondary | ICD-10-CM | POA: Diagnosis not present

## 2016-02-08 DIAGNOSIS — J701 Chronic and other pulmonary manifestations due to radiation: Secondary | ICD-10-CM | POA: Diagnosis not present

## 2016-02-11 ENCOUNTER — Inpatient Hospital Stay: Payer: Medicare Other | Attending: Internal Medicine | Admitting: Internal Medicine

## 2016-02-11 ENCOUNTER — Inpatient Hospital Stay: Payer: Medicare Other

## 2016-02-11 DIAGNOSIS — Z794 Long term (current) use of insulin: Secondary | ICD-10-CM | POA: Insufficient documentation

## 2016-02-11 DIAGNOSIS — J449 Chronic obstructive pulmonary disease, unspecified: Secondary | ICD-10-CM | POA: Diagnosis not present

## 2016-02-11 DIAGNOSIS — R911 Solitary pulmonary nodule: Secondary | ICD-10-CM

## 2016-02-11 DIAGNOSIS — I700 Atherosclerosis of aorta: Secondary | ICD-10-CM | POA: Diagnosis not present

## 2016-02-11 DIAGNOSIS — Z7952 Long term (current) use of systemic steroids: Secondary | ICD-10-CM | POA: Diagnosis not present

## 2016-02-11 DIAGNOSIS — Z880 Allergy status to penicillin: Secondary | ICD-10-CM | POA: Insufficient documentation

## 2016-02-11 DIAGNOSIS — Z79899 Other long term (current) drug therapy: Secondary | ICD-10-CM | POA: Insufficient documentation

## 2016-02-11 DIAGNOSIS — Z9981 Dependence on supplemental oxygen: Secondary | ICD-10-CM | POA: Diagnosis not present

## 2016-02-11 DIAGNOSIS — C3412 Malignant neoplasm of upper lobe, left bronchus or lung: Secondary | ICD-10-CM | POA: Insufficient documentation

## 2016-02-11 DIAGNOSIS — Z85118 Personal history of other malignant neoplasm of bronchus and lung: Secondary | ICD-10-CM

## 2016-02-11 LAB — CBC WITH DIFFERENTIAL/PLATELET
Basophils Absolute: 0.1 10*3/uL (ref 0–0.1)
Basophils Relative: 1 %
EOS PCT: 3 %
Eosinophils Absolute: 0.3 10*3/uL (ref 0–0.7)
HCT: 37.4 % — ABNORMAL LOW (ref 40.0–52.0)
Hemoglobin: 12.6 g/dL — ABNORMAL LOW (ref 13.0–18.0)
LYMPHS ABS: 1.5 10*3/uL (ref 1.0–3.6)
LYMPHS PCT: 17 %
MCH: 27.5 pg (ref 26.0–34.0)
MCHC: 33.6 g/dL (ref 32.0–36.0)
MCV: 81.9 fL (ref 80.0–100.0)
MONO ABS: 0.4 10*3/uL (ref 0.2–1.0)
Monocytes Relative: 5 %
Neutro Abs: 6.7 10*3/uL — ABNORMAL HIGH (ref 1.4–6.5)
Neutrophils Relative %: 74 %
PLATELETS: 233 10*3/uL (ref 150–440)
RBC: 4.56 MIL/uL (ref 4.40–5.90)
RDW: 14.5 % (ref 11.5–14.5)
WBC: 9.1 10*3/uL (ref 3.8–10.6)

## 2016-02-11 LAB — COMPREHENSIVE METABOLIC PANEL
ALT: 10 U/L — AB (ref 17–63)
AST: 15 U/L (ref 15–41)
Albumin: 3.7 g/dL (ref 3.5–5.0)
Alkaline Phosphatase: 92 U/L (ref 38–126)
Anion gap: 5 (ref 5–15)
BUN: 24 mg/dL — ABNORMAL HIGH (ref 6–20)
CHLORIDE: 98 mmol/L — AB (ref 101–111)
CO2: 32 mmol/L (ref 22–32)
CREATININE: 1.02 mg/dL (ref 0.61–1.24)
Calcium: 9.2 mg/dL (ref 8.9–10.3)
Glucose, Bld: 159 mg/dL — ABNORMAL HIGH (ref 65–99)
Potassium: 3.9 mmol/L (ref 3.5–5.1)
Sodium: 135 mmol/L (ref 135–145)
TOTAL PROTEIN: 6.5 g/dL (ref 6.5–8.1)
Total Bilirubin: 0.4 mg/dL (ref 0.3–1.2)

## 2016-02-11 NOTE — Assessment & Plan Note (Addendum)
#   LUL- hilar mass s/p RT. NED.   # RUL- spiculated nodule-slightly increasing in size over 3 months. Will discuss at tumor conference re: SBRT.   # Advanced COPD poor performance status on home O2  # follow up in 6 weeks or later based on recommendations from Tumor conference  Dr.Sparks/ Dr.Fleming.

## 2016-02-11 NOTE — Progress Notes (Signed)
[place] OFFICE PROGRESS NOTE  Patient Care Team: [name], MD as PCP - General (Internal Medicine)  SUMMARY OF ONCOLOGIC HISTORY: Oncology History   Carcinoma of lung T1 N1 M0 tumor previously seen by radiation oncologist and radiation therapy approximately March of 2015.  2.Patient was evaluated because of abnormal CT scan  dated March 15 of 2016A she had a 6 mm right upper lobe pulmonary nodule which has likely an largest and I will up since last CT scan of 2014 Patient has previously treated left lung cancer with radiation changes 3.  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  Repeat CT scan revealed complete resolution of  Pancreatic  Mass. (April of 2017) # Difficulty intubation [eval by Dr.McQueen; no neck mass]  # RUL nodule- ~87m- slowly growing     Primary cancer of bronchus of left upper lobe ([place]   02/11/2016 Initial Diagnosis    Primary cancer of bronchus of left upper lobe (HCC)      INTERVAL HISTORY:  75year old male patient with above history of COPD on Home O2 the Previous Left-Sided lung cancer status post SB RT; currentl  being followed  for his right upper lung nodule is here to review the results of the CAT scan.  Patient continues to have chronic shortness of breath chronic cough. Not any worse. No fevers. No chills.  No hemoptysis.  ROS: A complete 10 point review of system is done which is negative for mentioned above in history of present illness  I have reviewed the past medical history, past surgical history, social history and family history with the patient and they are unchanged from previous note.  ALLERGIES:  is allergic to ampicillin; penicillins; and zyban [bupropion].  MEDICATIONS:  Current Outpatient Prescriptions  Medication Sig Dispense Refill  . albuterol (PROVENTIL HFA;VENTOLIN HFA) 108 (90 Base) MCG/ACT inhaler Inhale 2 puffs into the lungs every 6 (six) hours as  needed for wheezing or shortness of breath.    .Marland Kitchenalbuterol (PROVENTIL) (2.5 MG/3ML) 0.083% nebulizer solution Take 2.5 mg by nebulization every 4 (four) hours as needed for wheezing or shortness of breath.     .Marland KitchenBREO ELLIPTA 100-25 MCG/INH AEPB     . cetirizine (ZYRTEC) 10 MG tablet Take 10 mg by mouth daily.     . citalopram (CELEXA) 20 MG tablet Take 20 mg by mouth daily.     .Marland Kitchendiltiazem (DILACOR XR) 180 MG 24 hr capsule Take 180 mg by mouth daily.    . enalapril (VASOTEC) 5 MG tablet TAKE 1 TABLET (5 MG TOTAL) BY MOUTH ONCE DAILY.  1  . furosemide (LASIX) 20 MG tablet Take 20 mg by mouth daily.     . hydrochlorothiazide (HYDRODIURIL) 25 MG tablet TAKE 1 TABLET (25 MG TOTAL) BY MOUTH ONCE DAILY.  1  . HYDROcodone-homatropine (HYCODAN) 5-1.5 MG/5ML syrup Take 5 mLs by mouth every 6 (six) hours as needed for cough.    . hydrOXYzine (ATARAX/VISTARIL) 10 MG tablet Take 10 mg by mouth at bedtime.    . insulin aspart (NOVOLOG) 100 UNIT/ML injection Inject 2-28 Units into the skin 3 (three) times daily with meals as needed for high blood sugar. Pt uses as needed per sliding scale.    . insulin detemir (LEVEMIR) 100 UNIT/ML injection Inject 20-40 Units into the skin 2 (two) times daily. Pt uses 20 units in the morning and 40 units at night.    . magnesium oxide (MAG-OX)  400 MG tablet Take 400 mg by mouth daily.     . pantoprazole (PROTONIX) 40 MG tablet Take 40 mg by mouth 2 (two) times daily before a meal.     . PARoxetine (PAXIL) 40 MG tablet Take 40 mg by mouth 2 (two) times daily.     . pravastatin (PRAVACHOL) 80 MG tablet Take 80 mg by mouth at bedtime.     . predniSONE (DELTASONE) 50 MG tablet Take 1 tablet (50 mg total) by mouth daily with breakfast. 4 tablet 0  . sildenafil (VIAGRA) 100 MG tablet Take 100 mg by mouth daily as needed for erectile dysfunction.     . tamsulosin (FLOMAX) 0.4 MG CAPS capsule Take 0.4 mg by mouth at bedtime.    . theophylline (THEO-24) 300 MG 24 hr capsule Take 300  mg by mouth daily.    Marland Kitchen tiotropium (SPIRIVA) 18 MCG inhalation capsule Place 18 mcg into inhaler and inhale daily.     Marland Kitchen tiZANidine (ZANAFLEX) 4 MG tablet Take 4 mg by mouth at bedtime.    . traZODone (DESYREL) 100 MG tablet Take 100 mg by mouth at bedtime.    . gabapentin (NEURONTIN) 300 MG capsule     . zolpidem (AMBIEN) 5 MG tablet Take by mouth.     No current facility-administered medications for this visit.     PHYSICAL EXAMINATION: ECOG PERFORMANCE STATUS: 3 - Symptomatic, >50% confined to bed  Vitals:   02/11/16 1118  BP: 112/78  Pulse: 94  Resp: 18  Temp: (!) 96.9 F (36.1 C)   Filed Weights   02/11/16 1118  Weight: 163 lb (73.9 kg)    GENERAL:alert, no distress and comfortable; he is in a wheelchair. Home O2. Accompanied by caregiver.  SKIN: skin color, texture, turgor are normal, no rashes or significant lesions EYES: normal, Conjunctiva are pink and non-injected, sclera clear OROPHARYNX:no exudate, no erythema and lips, buccal mucosa, and tongue normal  NECK: supple, thyroid normal size, non-tender, without nodularity LYMPH:  no palpable lymphadenopathy in the cervical, axillary or inguinal LUNGS: Decreased air entry bilaterally to auscultation; intermittent wheezing. No crackles HEART: regular rate & rhythm and no murmurs and no lower extremity edema ABDOMEN:abdomen soft, non-tender and normal bowel sounds Musculoskeletal:no cyanosis of digits and no clubbing  NEURO: alert & oriented x 3 with fluent speech, no focal motor/sensory deficits  LABORATORY DATA:  I have reviewed the data as listed    Component Value Date/Time   NA 135 02/11/2016 1050   NA 139 05/19/2014 0343   K 3.9 02/11/2016 1050   K 4.4 05/19/2014 0343   CL 98 (L) 02/11/2016 1050   CL 105 05/19/2014 0343   CO2 32 02/11/2016 1050   CO2 30 05/19/2014 0343   GLUCOSE 159 (H) 02/11/2016 1050   GLUCOSE 134 (H) 05/19/2014 0343   BUN 24 (H) 02/11/2016 1050   BUN 29 (H) 05/19/2014 0343    CREATININE 1.02 02/11/2016 1050   CREATININE 1.18 05/19/2014 0343   CALCIUM 9.2 02/11/2016 1050   CALCIUM 8.3 (L) 05/19/2014 0343   PROT 6.5 02/11/2016 1050   PROT 7.3 05/16/2014 1941   ALBUMIN 3.7 02/11/2016 1050   ALBUMIN 3.9 05/16/2014 1941   AST 15 02/11/2016 1050   AST 15 05/16/2014 1941   ALT 10 (L) 02/11/2016 1050   ALT 21 05/16/2014 1941   ALKPHOS 92 02/11/2016 1050   ALKPHOS 111 05/16/2014 1941   BILITOT 0.4 02/11/2016 1050   BILITOT 0.7 05/16/2014 1941   GFRNONAA >  60 02/11/2016 1050   GFRNONAA >60 05/19/2014 0343   GFRNONAA 60 (L) 08/29/2013 1559   GFRAA >60 02/11/2016 1050   GFRAA >60 05/19/2014 0343   GFRAA >60 08/29/2013 1559    No results found for: SPEP, UPEP  Lab Results  Component Value Date   WBC 9.1 02/11/2016   NEUTROABS 6.7 (H) 02/11/2016   HGB 12.6 (L) 02/11/2016   HCT 37.4 (L) 02/11/2016   MCV 81.9 02/11/2016   PLT 233 02/11/2016      Chemistry      Component Value Date/Time   NA 135 02/11/2016 1050   NA 139 05/19/2014 0343   K 3.9 02/11/2016 1050   K 4.4 05/19/2014 0343   CL 98 (L) 02/11/2016 1050   CL 105 05/19/2014 0343   CO2 32 02/11/2016 1050   CO2 30 05/19/2014 0343   BUN 24 (H) 02/11/2016 1050   BUN 29 (H) 05/19/2014 0343   CREATININE 1.02 02/11/2016 1050   CREATININE 1.18 05/19/2014 0343      Component Value Date/Time   CALCIUM 9.2 02/11/2016 1050   CALCIUM 8.3 (L) 05/19/2014 0343   ALKPHOS 92 02/11/2016 1050   ALKPHOS 111 05/16/2014 1941   AST 15 02/11/2016 1050   AST 15 05/16/2014 1941   ALT 10 (L) 02/11/2016 1050   ALT 21 05/16/2014 1941   BILITOT 0.4 02/11/2016 1050   BILITOT 0.7 05/16/2014 1941       RADIOGRAPHIC STUDIES: I have personally reviewed the radiological images as listed and agreed with the findings in the report. No results found.   IMPRESSION: 1. Slight interval progression of the spiculated right upper lobe pulmonary nodule, highly suspicious for metachronous primary or metastatic disease. 2.  Interval development of patchy and nodular airspace consolidation posterior right lower lobe with areas of more proximal airway impaction. Imaging features suggest aspiration with potential associated pneumonia. Atypical pneumonia could also have this appearance. 3. 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 4. Coronary artery and thoracic aortic atherosclerosis.   Electronically Signed   By: Misty Stanley M.D.   On: 02/08/2016 13:12  ASSESSMENT & PLAN:  Primary cancer of bronchus of left upper lobe ([place]) # LUL- hilar mass s/p RT. NED.   # RUL- spiculated nodule-slightly increasing in size over 3 months. Will discuss at tumor conference re: SBRT.   # Advanced COPD poor performance status on home O2  # follow up in 6 weeks or later based on recommendations from Tumor conference  Dr.Sparks/ Dr.Fleming.    No orders of the defined types were placed in this encounter.    Cammie Sickle, MD 02/11/2016 1:03 PM

## 2016-03-24 ENCOUNTER — Inpatient Hospital Stay: Payer: Medicare Other | Attending: Internal Medicine | Admitting: Internal Medicine

## 2016-03-24 VITALS — BP 152/74 | HR 85 | Temp 96.7°F | Wt 165.6 lb

## 2016-03-24 DIAGNOSIS — Z880 Allergy status to penicillin: Secondary | ICD-10-CM | POA: Insufficient documentation

## 2016-03-24 DIAGNOSIS — C3412 Malignant neoplasm of upper lobe, left bronchus or lung: Secondary | ICD-10-CM | POA: Insufficient documentation

## 2016-03-24 DIAGNOSIS — Z79899 Other long term (current) drug therapy: Secondary | ICD-10-CM | POA: Diagnosis not present

## 2016-03-24 DIAGNOSIS — Z9981 Dependence on supplemental oxygen: Secondary | ICD-10-CM | POA: Insufficient documentation

## 2016-03-24 DIAGNOSIS — Z923 Personal history of irradiation: Secondary | ICD-10-CM | POA: Insufficient documentation

## 2016-03-24 DIAGNOSIS — J449 Chronic obstructive pulmonary disease, unspecified: Secondary | ICD-10-CM | POA: Diagnosis not present

## 2016-03-24 DIAGNOSIS — Z794 Long term (current) use of insulin: Secondary | ICD-10-CM | POA: Diagnosis not present

## 2016-03-24 DIAGNOSIS — I700 Atherosclerosis of aorta: Secondary | ICD-10-CM | POA: Diagnosis not present

## 2016-03-24 DIAGNOSIS — Z7952 Long term (current) use of systemic steroids: Secondary | ICD-10-CM | POA: Diagnosis not present

## 2016-03-24 DIAGNOSIS — R911 Solitary pulmonary nodule: Secondary | ICD-10-CM | POA: Insufficient documentation

## 2016-03-24 NOTE — Progress Notes (Signed)
Patient ambulates without assistance, brought to exam room 16.  Patient denies pain or discomfort at this time.  BP 152/74, HR 92 O2 95% on 6L

## 2016-03-24 NOTE — Progress Notes (Signed)
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  2.Patient was evaluated because of abnormal CT scan  dated March 15 of 2016A she had a 6 mm right upper lobe pulmonary nodule which has likely an largest and I will up since last CT scan of 2014 Patient has previously treated left lung cancer with radiation changes 3.  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  Repeat CT scan revealed complete resolution of  Pancreatic  Mass. 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 Not any worse. No fevers. No chills.  No hemoptysis.  ROS: A complete 10 point review of system is done which is negative for mentioned above in history of present illness  I have reviewed the past medical history, past surgical history, social history and family history with the patient and they are unchanged from previous note.  ALLERGIES:  is allergic to ampicillin; penicillins; and zyban [bupropion].  MEDICATIONS:  Current Outpatient Prescriptions  Medication Sig Dispense Refill  . albuterol (PROVENTIL HFA;VENTOLIN HFA) 108 (90 Base) MCG/ACT inhaler Inhale 2 puffs into the lungs every  6 (six) hours as needed for wheezing or shortness of breath.    .Marland Kitchenalbuterol (PROVENTIL) (2.5 MG/3ML) 0.083% nebulizer solution Take 2.5 mg by nebulization every 4 (four) hours as needed for wheezing or shortness of breath.     .Marland KitchenBREO ELLIPTA 100-25 MCG/INH AEPB     . cetirizine (ZYRTEC) 10 MG tablet Take 10 mg by mouth daily.     . citalopram (CELEXA) 20 MG tablet Take 20 mg by mouth daily.     .Marland Kitchendiltiazem (DILACOR XR) 180 MG 24 hr capsule Take 180 mg by mouth daily.    . enalapril (VASOTEC) 5 MG tablet TAKE 1 TABLET (5 MG TOTAL) BY MOUTH ONCE DAILY.  1  . furosemide (LASIX) 20 MG tablet Take 20 mg by mouth daily.     .Marland Kitchengabapentin (NEURONTIN) 300 MG capsule     . hydrochlorothiazide (HYDRODIURIL) 25 MG tablet TAKE 1 TABLET (25 MG TOTAL) BY MOUTH ONCE DAILY.  1  . HYDROcodone-homatropine (HYCODAN) 5-1.5 MG/5ML syrup Take 5 mLs by mouth every 6 (six) hours as needed for cough.    . hydrOXYzine (ATARAX/VISTARIL) 10 MG tablet Take 10 mg by mouth at bedtime.    . insulin aspart (NOVOLOG) 100 UNIT/ML injection Inject 2-28 Units into the skin 3 (three) times daily with meals as needed for high blood sugar. Pt uses as needed per sliding scale.    . insulin detemir (LEVEMIR) 100 UNIT/ML injection Inject 20-40 Units into the skin 2 (two) times daily. Pt uses 20 units in the  morning and 40 units at night.    . magnesium oxide (MAG-OX) 400 MG tablet Take 400 mg by mouth daily.     . pantoprazole (PROTONIX) 40 MG tablet Take 40 mg by mouth 2 (two) times daily before a meal.     . PARoxetine (PAXIL) 40 MG tablet Take 40 mg by mouth 2 (two) times daily.     . pravastatin (PRAVACHOL) 80 MG tablet Take 80 mg by mouth at bedtime.     . predniSONE (DELTASONE) 50 MG tablet Take 1 tablet (50 mg total) by mouth daily with breakfast. 4 tablet 0  . sildenafil (VIAGRA) 100 MG tablet Take 100 mg by mouth daily as needed for erectile dysfunction.     . tamsulosin (FLOMAX) 0.4 MG CAPS capsule Take 0.4 mg by mouth at  bedtime.    . theophylline (THEO-24) 300 MG 24 hr capsule Take 300 mg by mouth daily.    Marland Kitchen tiotropium (SPIRIVA) 18 MCG inhalation capsule Place 18 mcg into inhaler and inhale daily.     Marland Kitchen tiZANidine (ZANAFLEX) 4 MG tablet Take 4 mg by mouth at bedtime.    . traZODone (DESYREL) 100 MG tablet Take 100 mg by mouth at bedtime.    Marland Kitchen zolpidem (AMBIEN) 5 MG tablet Take by mouth.     No current facility-administered medications for this visit.     PHYSICAL EXAMINATION: ECOG PERFORMANCE STATUS: 3 - Symptomatic, >50% confined to bed  Vitals:   03/24/16 1141  BP: (!) 152/74  Pulse: 85  Temp: (!) 96.7 F (35.9 C)   Filed Weights   03/24/16 1141  Weight: 165 lb 9.6 oz (75.1 kg)    GENERAL:alert, no distress and comfortable; he is in a wheelchair. Home O2. His walking by himself. He is alone. SKIN: skin color, texture, turgor are normal, no rashes or significant lesions EYES: normal, Conjunctiva are pink and non-injected, sclera clear OROPHARYNX:no exudate, no erythema and lips, buccal mucosa, and tongue normal  NECK: supple, thyroid normal size, non-tender, without nodularity LYMPH:  no palpable lymphadenopathy in the cervical, axillary or inguinal LUNGS: Decreased air entry bilaterally to auscultation; intermittent wheezing. No crackles HEART: regular rate & rhythm and no murmurs and no lower extremity edema ABDOMEN:abdomen soft, non-tender and normal bowel sounds Musculoskeletal:no cyanosis of digits and no clubbing  NEURO: alert & oriented x 3 with fluent speech, no focal motor/sensory deficits  LABORATORY DATA:  I have reviewed the data as listed    Component Value Date/Time   NA 135 02/11/2016 1050   NA 139 05/19/2014 0343   K 3.9 02/11/2016 1050   K 4.4 05/19/2014 0343   CL 98 (L) 02/11/2016 1050   CL 105 05/19/2014 0343   CO2 32 02/11/2016 1050   CO2 30 05/19/2014 0343   GLUCOSE 159 (H) 02/11/2016 1050   GLUCOSE 134 (H) 05/19/2014 0343   BUN 24 (H) 02/11/2016 1050   BUN  29 (H) 05/19/2014 0343   CREATININE 1.02 02/11/2016 1050   CREATININE 1.18 05/19/2014 0343   CALCIUM 9.2 02/11/2016 1050   CALCIUM 8.3 (L) 05/19/2014 0343   PROT 6.5 02/11/2016 1050   PROT 7.3 05/16/2014 1941   ALBUMIN 3.7 02/11/2016 1050   ALBUMIN 3.9 05/16/2014 1941   AST 15 02/11/2016 1050   AST 15 05/16/2014 1941   ALT 10 (L) 02/11/2016 1050   ALT 21 05/16/2014 1941   ALKPHOS 92 02/11/2016 1050   ALKPHOS 111 05/16/2014 1941   BILITOT 0.4 02/11/2016 1050   BILITOT  0.7 05/16/2014 1941   GFRNONAA >60 02/11/2016 1050   GFRNONAA >60 05/19/2014 0343   GFRNONAA 60 (L) 08/29/2013 1559   GFRAA >60 02/11/2016 1050   GFRAA >60 05/19/2014 0343   GFRAA >60 08/29/2013 1559    No results found for: SPEP, UPEP  Lab Results  Component Value Date   WBC 9.1 02/11/2016   NEUTROABS 6.7 (H) 02/11/2016   HGB 12.6 (L) 02/11/2016   HCT 37.4 (L) 02/11/2016   MCV 81.9 02/11/2016   PLT 233 02/11/2016      Chemistry      Component Value Date/Time   NA 135 02/11/2016 1050   NA 139 05/19/2014 0343   K 3.9 02/11/2016 1050   K 4.4 05/19/2014 0343   CL 98 (L) 02/11/2016 1050   CL 105 05/19/2014 0343   CO2 32 02/11/2016 1050   CO2 30 05/19/2014 0343   BUN 24 (H) 02/11/2016 1050   BUN 29 (H) 05/19/2014 0343   CREATININE 1.02 02/11/2016 1050   CREATININE 1.18 05/19/2014 0343      Component Value Date/Time   CALCIUM 9.2 02/11/2016 1050   CALCIUM 8.3 (L) 05/19/2014 0343   ALKPHOS 92 02/11/2016 1050   ALKPHOS 111 05/16/2014 1941   AST 15 02/11/2016 1050   AST 15 05/16/2014 1941   ALT 10 (L) 02/11/2016 1050   ALT 21 05/16/2014 1941   BILITOT 0.4 02/11/2016 1050   BILITOT 0.7 05/16/2014 1941       RADIOGRAPHIC STUDIES: I have personally reviewed the radiological images as listed and agreed with the findings in the report. No results found.   IMPRESSION: 1. Slight interval progression of the spiculated right upper lobe pulmonary nodule, highly suspicious for metachronous primary  or metastatic disease. 2. Interval development of patchy and nodular airspace consolidation posterior right lower lobe with areas of more proximal airway impaction. Imaging features suggest aspiration with potential associated pneumonia. Atypical pneumonia could also have this appearance. 3. 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 4. Coronary artery and thoracic aortic atherosclerosis.   Electronically Signed   By: Misty Stanley M.D.   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 NED.   # RUL- spiculated nodule-slightly increasing in size over 3 months. Discussed at TB- repeat CT scan in 6 months from last scan- and if continues to grow- then SBRT.   # Advanced COPD poor performance status on home O2- STBALE/ 6Lit.   # follow up with me in Feb 2018/labs; Ct scan few days prior.   Dr.Sparks/ Dr.Fleming.    Orders Placed This Encounter  Procedures  . CT CHEST WO CONTRAST    Standing Status:   Future    Standing Expiration Date:   05/24/2017    Order Specific Question:   Reason for Exam (SYMPTOM  OR DIAGNOSIS REQUIRED)    Answer:   lung nodule    Order Specific Question:   Preferred imaging location?    Answer:   [facility]  . CBC with Differential    Standing Status:   Future    Standing Expiration Date:   03/24/2017  . Comprehensive metabolic panel    Standing Status:   Future    Standing Expiration Date:   03/24/2017     Cammie Sickle, MD 03/24/2016 1:17 PM

## 2016-03-24 NOTE — Assessment & Plan Note (Signed)
#   LUL- hilar mass s/p RT. NED.   # RUL- spiculated nodule-slightly increasing in size over 3 months. Discussed at TB- repeat CT scan in 6 months from last scan- and if continues to grow- then SBRT.   # Advanced COPD poor performance status on home O2- STBALE/ 6Lit.   # follow up with me in Feb 2018/labs; Ct scan few days prior.   Dr.Sparks/ Dr.Fleming.

## 2016-05-23 ENCOUNTER — Encounter: Payer: Self-pay | Admitting: *Deleted

## 2016-05-23 ENCOUNTER — Emergency Department
Admission: EM | Admit: 2016-05-23 | Discharge: 2016-05-24 | Disposition: A | Discharge status: Home / Self Care | Payer: Medicare Other | Attending: Emergency Medicine | Admitting: Emergency Medicine

## 2016-05-23 DIAGNOSIS — Z794 Long term (current) use of insulin: Secondary | ICD-10-CM | POA: Insufficient documentation

## 2016-05-23 DIAGNOSIS — Z79899 Other long term (current) drug therapy: Secondary | ICD-10-CM | POA: Diagnosis not present

## 2016-05-23 DIAGNOSIS — Z85118 Personal history of other malignant neoplasm of bronchus and lung: Secondary | ICD-10-CM | POA: Insufficient documentation

## 2016-05-23 DIAGNOSIS — J9611 Chronic respiratory failure with hypoxia: Secondary | ICD-10-CM | POA: Diagnosis not present

## 2016-05-23 DIAGNOSIS — Z9981 Dependence on supplemental oxygen: Secondary | ICD-10-CM | POA: Diagnosis not present

## 2016-05-23 DIAGNOSIS — Z87891 Personal history of nicotine dependence: Secondary | ICD-10-CM | POA: Insufficient documentation

## 2016-05-23 DIAGNOSIS — J45909 Unspecified asthma, uncomplicated: Secondary | ICD-10-CM | POA: Diagnosis not present

## 2016-05-23 DIAGNOSIS — E119 Type 2 diabetes mellitus without complications: Secondary | ICD-10-CM | POA: Insufficient documentation

## 2016-05-23 DIAGNOSIS — R0602 Shortness of breath: Secondary | ICD-10-CM | POA: Diagnosis present

## 2016-05-23 DIAGNOSIS — I10 Essential (primary) hypertension: Secondary | ICD-10-CM | POA: Diagnosis not present

## 2016-05-23 DIAGNOSIS — J441 Chronic obstructive pulmonary disease with (acute) exacerbation: Secondary | ICD-10-CM | POA: Diagnosis not present

## 2016-05-23 MED ORDER — IPRATROPIUM-ALBUTEROL 0.5-2.5 (3) MG/3ML IN SOLN
3.0000 mL | Freq: Once | RESPIRATORY_TRACT | Status: AC
  Administered 2016-05-23: 3 mL via RESPIRATORY_TRACT
  Filled 2016-05-23: qty 3

## 2016-05-23 NOTE — ED Triage Notes (Signed)
Pt wears O2 at home, 6L/min/[place]. Pt's O2 concentrator is malfunctioning. Pt has been through six of these at home in past week w/ malfunction. Pt placed on O2 in triage from his home O2 traveling tank.

## 2016-05-23 NOTE — ED Provider Notes (Signed)
[facility] Emergency Department Provider Note  ____________________________________________  Time seen: 11:00 PM I have reviewed the triage vital signs and the nursing notes.   HISTORY  Chief Complaint Shortness of Breath      HPI Earl Clay is a 75 y.o. male history of COPD on baseline oxygen level of 6 L via nasal cannula at home presents to the emergency department stating that his oxygen compressor at home is not working. Patient states that he notified the company which is located in [place] of such and they stated that NU compressor was being shipped him via [place] subsequently discharged with and then will be carried to his home however patient does not have an estimated time of arrival. Patient states that this occurs at least once or twice annually.    Past Medical History:  Diagnosis Date  . Asthma   . CAD (coronary artery disease)   . COPD (chronic obstructive pulmonary disease) ([place])   . Depression   . Diabetes mellitus without complication ([place])   . Difficult intubation   . GERD (gastroesophageal reflux disease)   . Hypertension   . Lung cancer ([place])   . 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 Pancreatic mass   . PTSD (post-traumatic stress disorder)   . Sleep apnea    CPAP with O2 2L/m at night    Patient Active Problem List   Diagnosis Date Noted  . 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 Malnutrition of moderate degree 09/09/2015  . COPD exacerbation ([place]) 09/09/2015  . ARF (acute renal failure) ([place]) 09/07/2015  . Type 2 diabetes mellitus ([place]) 07/20/2015  . Type 2 diabetes mellitus with hyperglycemia ([place]) 05/16/2014  . History of cardiac catheterization 01/24/2014  . Cancer of lung ([place]) 01/24/2014  . Breathlessness on exertion 01/24/2014  . Chronic obstructive bronchitis ([place]) 11/17/2013  . CAD in native artery 11/17/2013  . Benign essential HTN 11/17/2013  . Combined fat and  carbohydrate induced hyperlipemia 11/17/2013  . Hypersomnia with sleep apnea 11/17/2013  . 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 Pure hypercholesterolemia 11/17/2013  . 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 Elevated prostate specific antigen (PSA) 08/18/2012  . Disorder of bladder function 08/18/2012  . Enlarged prostate with lower urinary tract symptoms (LUTS) 08/18/2012  . Incomplete bladder emptying 08/18/2012  . Benign prostatic hyperplasia with urinary obstruction 08/18/2012  . FOM (frequency of micturition) 08/18/2012    Past Surgical History:  Procedure Laterality Date  . HERNIA REPAIR Right    right inguinal hernia repair  . KNEE SURGERY Right    right knee repair  . UPPER ESOPHAGEAL ENDOSCOPIC ULTRASOUND (EUS) N/A 09/20/2015   Procedure: UPPER ESOPHAGEAL ENDOSCOPIC ULTRASOUND (EUS);  Surgeon: [name], MD;  Location[facility] ENDOSCOPY;  Service: Endoscopy;  Laterality: N/A;    Current Outpatient Rx  . Order #: [id number] Class: Historical Med  . Order #: [id number] Class: Historical Med  . Order #: [id number] Class: Historical Med  . Order #: [id number] Class: Historical Med  . Order #: [id number] Class: Historical Med  . Order #: [id number] Class: Historical Med  . Order #: [id number] Class: Historical Med  . Order #: [id number] Class: Historical Med  . Order #: [id number] Class: Historical Med  . Order #: [id number] Class: Historical Med  . Order #: [id number] Class: Historical Med  . Order #: [id number] Class: Historical Med  . Order #: [id number] Class: Historical Med  . Order #: [id number] Class: Historical Med  . Order #: [id number] Class: Historical Med  . Order #: [id number] Class:  Historical Med  . Order #: [id number] Class: Historical Med  . Order #: [id number] Class: Historical Med  . Order #: [id number] Class: Print  . Order #: [id number] Class: Historical Med  . Order #: [id number] Class: Historical Med  . Order #: [id number] Class: Historical Med  . Order #: [id number] Class:  Historical Med  . Order #: [id number] Class: Historical Med  . Order #: [id number] Class: Historical Med  . Order #: [id number] Class: Historical Med    Allergies Ampicillin; Penicillins; and Zyban [bupropion]  Family History  Problem Relation Age of Onset  . Hypertension Mother   . Congestive Heart Failure Father     Social History Social History  Substance Use Topics  . Smoking status: Former Research scientist (life sciences)  . Smokeless tobacco: Never Used  . Alcohol use Not on file    Review of Systems  Constitutional: Negative for fever. Eyes: Negative for visual changes. ENT: Negative for sore throat. Cardiovascular: Negative for chest pain. Respiratory: Negative for shortness of breath. Gastrointestinal: Negative for abdominal pain, vomiting and diarrhea. Genitourinary: Negative for dysuria. Musculoskeletal: Negative for back pain. Skin: Negative for rash. Neurological: Negative for headaches, focal weakness or numbness.   10-point ROS otherwise negative.  ____________________________________________   PHYSICAL EXAM:  VITAL SIGNS: ED Triage Vitals [05/23/16 2223]  Enc Vitals Group     BP 125/79     Pulse Rate 99     Resp (!) 24     Temp 97.9 F (36.6 C)     Temp Source Oral     SpO2 95 %     Weight 158 lb (71.7 kg)     Height '5\' 6"'$  (1.676 m)     Head Circumference      Peak Flow      Pain Score      Pain Loc      Pain Edu?      Excl. in [place]?     Constitutional: Alert and oriented. Well appearing and in no distress. Eyes: Conjunctivae are normal. PERRL. Normal extraocular movements. ENT   Head: Normocephalic and atraumatic.   Nose: No congestion/rhinnorhea.   Mouth/Throat: Mucous membranes are moist.   Neck: No stridor. Hematological/Lymphatic/Immunilogical: No cervical lymphadenopathy. Cardiovascular: Normal rate, regular rhythm. Normal and symmetric distal pulses are present in all extremities. No murmurs, rubs, or gallops. Respiratory: Normal respiratory  effort without tachypnea nor retractions. Bibasilar expiratory wheezes Gastrointestinal: Soft and nontender. No distention. There is no CVA tenderness. Genitourinary: deferred Musculoskeletal: Nontender with normal range of motion in all extremities. No joint effusions.  No lower extremity tenderness nor edema. Neurologic:  Normal speech and language. No gross focal neurologic deficits are appreciated. Speech is normal.  Skin:  Skin is warm, dry and intact. No rash noted. Psychiatric: Mood and affect are normal. Speech and behavior are normal. Patient exhibits appropriate insight and judgment.    Procedures      INITIAL IMPRESSION / ASSESSMENT AND PLAN / ED COURSE  Pertinent labs & imaging results that were available during my care of the patient were reviewed by me and considered in my medical decision making (see chart for details).  Social consult placed patient's care transferred to  ____________________________________________   FINAL CLINICAL IMPRESSION(S) / ED DIAGNOSES  Final diagnoses:  Chronic respiratory failure with hypoxia ([place])  Dependence on continuous supplemental oxygen      Gregor Hams, MD 05/27/16 [phone]

## 2016-05-24 DIAGNOSIS — J9611 Chronic respiratory failure with hypoxia: Secondary | ICD-10-CM | POA: Diagnosis not present

## 2016-05-24 LAB — GLUCOSE, CAPILLARY
GLUCOSE-CAPILLARY: 189 mg/dL — AB (ref 65–99)
Glucose-Capillary: 206 mg/dL — ABNORMAL HIGH (ref 65–99)
Glucose-Capillary: 184 mg/dL — ABNORMAL HIGH (ref 65–99)

## 2016-05-24 MED ORDER — TRAZODONE HCL 100 MG PO TABS: 100.0000 mg | ORAL_TABLET | Freq: Every day | ORAL | Status: DC

## 2016-05-24 MED ORDER — INSULIN DETEMIR 100 UNIT/ML ~~LOC~~ SOLN
40.0000 [IU] | Freq: Every day | SUBCUTANEOUS | Status: DC
  Filled 2016-05-24: qty 0.4

## 2016-05-24 MED ORDER — GABAPENTIN 300 MG PO CAPS
300.0000 mg | ORAL_CAPSULE | Freq: Two times a day (BID) | ORAL | Status: DC
  Administered 2016-05-24: 300 mg via ORAL
  Filled 2016-05-24: qty 1

## 2016-05-24 MED ORDER — HYDROXYZINE HCL 10 MG PO TABS: 10.0000 mg | ORAL_TABLET | Freq: Every day | ORAL | Status: DC

## 2016-05-24 MED ORDER — INSULIN ASPART 100 UNIT/ML ~~LOC~~ SOLN
2.0000 [IU] | Freq: Three times a day (TID) | SUBCUTANEOUS | Status: DC | PRN
  Filled 2016-05-24: qty 3

## 2016-05-24 MED ORDER — INSULIN DETEMIR 100 UNIT/ML ~~LOC~~ SOLN
20.0000 [IU] | Freq: Every morning | SUBCUTANEOUS | Status: DC
  Filled 2016-05-24: qty 0.2

## 2016-05-24 MED ORDER — HYDROCODONE-HOMATROPINE 5-1.5 MG/5ML PO SYRP
5.0000 mL | ORAL_SOLUTION | Freq: Four times a day (QID) | ORAL | Status: DC | PRN
Start: 2016-05-24 — End: 2016-05-24

## 2016-05-24 MED ORDER — PAROXETINE HCL 20 MG PO TABS: 40.0000 mg | ORAL_TABLET | Freq: Two times a day (BID) | ORAL | Status: DC

## 2016-05-24 MED ORDER — ALBUTEROL SULFATE (2.5 MG/3ML) 0.083% IN NEBU: 2.5000 mg | INHALATION_SOLUTION | RESPIRATORY_TRACT | Status: DC | PRN

## 2016-05-24 MED ORDER — PRAVASTATIN SODIUM 80 MG PO TABS: 80.0000 mg | ORAL_TABLET | Freq: Every day | ORAL | Status: DC

## 2016-05-24 MED ORDER — ALBUTEROL SULFATE HFA 108 (90 BASE) MCG/ACT IN AERS: 2.0000 | INHALATION_SPRAY | Freq: Four times a day (QID) | RESPIRATORY_TRACT | Status: DC | PRN

## 2016-05-24 MED ORDER — MAGNESIUM OXIDE 400 (241.3 MG) MG PO TABS
400.0000 mg | ORAL_TABLET | Freq: Every day | ORAL | Status: DC
  Administered 2016-05-24: 400 mg via ORAL
  Filled 2016-05-24: qty 1

## 2016-05-24 MED ORDER — HYDROCHLOROTHIAZIDE 25 MG PO TABS: 25.0000 mg | ORAL_TABLET | Freq: Every day | ORAL | Status: DC

## 2016-05-24 MED ORDER — ENALAPRIL MALEATE 5 MG PO TABS: 5.0000 mg | ORAL_TABLET | Freq: Every day | ORAL | Status: DC

## 2016-05-24 MED ORDER — TAMSULOSIN HCL 0.4 MG PO CAPS: 0.4000 mg | ORAL_CAPSULE | Freq: Every day | ORAL | Status: DC

## 2016-05-24 MED ORDER — FUROSEMIDE 40 MG PO TABS
20.0000 mg | ORAL_TABLET | Freq: Every day | ORAL | Status: DC
  Administered 2016-05-24: 20 mg via ORAL
  Filled 2016-05-24: qty 1

## 2016-05-24 MED ORDER — THEOPHYLLINE ER 300 MG PO CP24
300.0000 mg | ORAL_CAPSULE | Freq: Every day | ORAL | Status: DC
  Administered 2016-05-24: 300 mg via ORAL
  Filled 2016-05-24: qty 1

## 2016-05-24 MED ORDER — PANTOPRAZOLE SODIUM 40 MG PO TBEC
40.0000 mg | DELAYED_RELEASE_TABLET | Freq: Two times a day (BID) | ORAL | Status: DC
  Administered 2016-05-24: 40 mg via ORAL
  Filled 2016-05-24: qty 1

## 2016-05-24 MED ORDER — CITALOPRAM HYDROBROMIDE 20 MG PO TABS: 20.0000 mg | ORAL_TABLET | Freq: Every day | ORAL | Status: DC

## 2016-05-24 MED ORDER — INSULIN DETEMIR 100 UNIT/ML ~~LOC~~ SOLN
20.0000 [IU] | Freq: Two times a day (BID) | SUBCUTANEOUS | Status: DC
  Filled 2016-05-24 (×2): qty 0.4

## 2016-05-24 MED ORDER — INSULIN ASPART 100 UNIT/ML ~~LOC~~ SOLN
0.0000 [IU] | Freq: Three times a day (TID) | SUBCUTANEOUS | Status: DC
  Administered 2016-05-24: 5 [IU] via SUBCUTANEOUS
  Administered 2016-05-24 (×2): 3 [IU] via SUBCUTANEOUS
  Filled 2016-05-24: qty 5
  Filled 2016-05-24: qty 3

## 2016-05-24 MED ORDER — PAROXETINE HCL 20 MG PO TABS: 40.0000 mg | ORAL_TABLET | Freq: Every day | ORAL | Status: DC

## 2016-05-24 MED ORDER — DILTIAZEM HCL ER 180 MG PO CP24
180.0000 mg | ORAL_CAPSULE | Freq: Every day | ORAL | Status: DC
  Administered 2016-05-24: 180 mg via ORAL
  Filled 2016-05-24: qty 1

## 2016-05-24 MED ORDER — TIOTROPIUM BROMIDE MONOHYDRATE 18 MCG IN CAPS
18.0000 ug | ORAL_CAPSULE | Freq: Every day | RESPIRATORY_TRACT | Status: DC
  Administered 2016-05-24: 18 ug via RESPIRATORY_TRACT
  Filled 2016-05-24: qty 5

## 2016-05-24 NOTE — ED Notes (Signed)
Pt. Brought in oxygen machine.  Machine hooked up and operational by family and patient.  Pt. And pt. Family given nasal canal extension.

## 2016-05-24 NOTE — Care Management (Signed)
Patient has chronic oxygen at 6 liters baseline.  His oxygen is provided by [employer] for the past 4 years and set up by Dr [employer] office..  For the past 4 weeks patient has had problems with his concentrator and company has shipped units to the home.  Says "it works with my sleep apnea machine but not the unit on the floor.  Says multiple calls were made by the MD office, himself and family and someone from [facility] told him to have the nurse from the doctors office to quit calling.  This agency does not have a local office.  Any equipment has to be shipped from [place].  The local representative Jill Alexanders is out of the office.  Informed That unit is currently at the [facility] and it is anticipated that the unit should arrive in [place] around 6-8pm.  Person that is taking the emergency calls, has limited information regarding all of the communications from yesterday. Have left a message with the local rep who will be back in the office Tuesday.  Patient may want to consider alternate company that can manage his high 02 requirements that is local.  [employer], however is a company that specializes in this need.

## 2016-05-24 NOTE — ED Notes (Signed)
Bonnita Nasuti, Nurse Care Manager at pt bedside.

## 2016-05-24 NOTE — ED Notes (Signed)
Pt given breakfast tray

## 2016-05-24 NOTE — ED Notes (Signed)
Pt given drink and TV remote.

## 2016-05-24 NOTE — ED Notes (Signed)
Family member called to report they had machine and were bringing it to ED to try on patient.

## 2016-05-24 NOTE — ED Notes (Signed)
Spoke with patient regarding daily medications, will have pharmacy tech update list and have meds ordered as prescribed. Pt updated that the oxygen equipment will be delivered tonight.

## 2016-05-24 NOTE — ED Notes (Signed)
Pt. Going home with personal O2 tank and oxygen machine.

## 2016-05-24 NOTE — ED Notes (Signed)
Family called to report, new air machine came in, family member will check to see if it works and will call back.  [phone]

## 2016-05-24 NOTE — ED Notes (Signed)
CBG 189.

## 2016-05-24 NOTE — ED Notes (Signed)
Pt given drink and ice cream per request.

## 2016-05-24 NOTE — ED Notes (Signed)
Daughter called to inform patient that the oxygen company confirmed that oxygen equipment will be at the house at approx [place].

## 2016-05-24 NOTE — ED Notes (Addendum)
Pt does not know what he takes at home, uses 2 different pharmacies. Pt will be discharged tonight and states that he will resume taking his medications when he gets home tomorrow morning. Pt agreed to hold some of his medications due to not knowing which ones he is currently taking of which pharmacy has them correct.

## 2016-05-24 NOTE — ED Notes (Signed)
Pt ate all of breakfast.

## 2016-05-24 NOTE — Care Management (Signed)
Follow up with [employer] and 02 concentrator to arrive at the home 6-7pm.  Instructed patient and wife that someone must be at the residence to accept the equipment and it should be someone that knows how to operate the concentrator to assure is working properly.  [employer] maintains that there is no one that can be sent to the home.  If there are questions regarding operation of equipment would need to again call the emergency line [phone].  (Patient has this number)  Wife does not "know anything about the machine."  Updated physician advisor, charge nurse and ED physician

## 2016-05-24 NOTE — ED Notes (Signed)
Awaiting medications to be sent from pharmacy.

## 2016-08-08 ENCOUNTER — Ambulatory Visit
Admission: RE | Admit: 2016-08-08 | Discharge: 2016-08-08 | Disposition: A | Discharge status: Home / Self Care | Payer: Medicare Other | Source: Ambulatory Visit | Attending: Internal Medicine | Admitting: Internal Medicine

## 2016-08-08 DIAGNOSIS — I2510 Atherosclerotic heart disease of native coronary artery without angina pectoris: Secondary | ICD-10-CM | POA: Diagnosis not present

## 2016-08-08 DIAGNOSIS — I700 Atherosclerosis of aorta: Secondary | ICD-10-CM | POA: Diagnosis not present

## 2016-08-08 DIAGNOSIS — C3412 Malignant neoplasm of upper lobe, left bronchus or lung: Secondary | ICD-10-CM | POA: Diagnosis not present

## 2016-08-08 DIAGNOSIS — R911 Solitary pulmonary nodule: Secondary | ICD-10-CM | POA: Insufficient documentation

## 2016-08-15 ENCOUNTER — Inpatient Hospital Stay (HOSPITAL_BASED_OUTPATIENT_CLINIC_OR_DEPARTMENT_OTHER): Payer: Medicare Other | Admitting: Internal Medicine

## 2016-08-15 ENCOUNTER — Inpatient Hospital Stay: Payer: Medicare Other | Attending: Internal Medicine

## 2016-08-15 DIAGNOSIS — K869 Disease of pancreas, unspecified: Secondary | ICD-10-CM

## 2016-08-15 DIAGNOSIS — C3412 Malignant neoplasm of upper lobe, left bronchus or lung: Secondary | ICD-10-CM | POA: Diagnosis present

## 2016-08-15 DIAGNOSIS — J449 Chronic obstructive pulmonary disease, unspecified: Secondary | ICD-10-CM

## 2016-08-15 DIAGNOSIS — R911 Solitary pulmonary nodule: Secondary | ICD-10-CM

## 2016-08-15 DIAGNOSIS — N183 Chronic kidney disease, stage 3 (moderate): Secondary | ICD-10-CM | POA: Diagnosis not present

## 2016-08-15 DIAGNOSIS — Z923 Personal history of irradiation: Secondary | ICD-10-CM

## 2016-08-15 DIAGNOSIS — Z794 Long term (current) use of insulin: Secondary | ICD-10-CM | POA: Insufficient documentation

## 2016-08-15 DIAGNOSIS — Z79899 Other long term (current) drug therapy: Secondary | ICD-10-CM | POA: Insufficient documentation

## 2016-08-15 DIAGNOSIS — Z9981 Dependence on supplemental oxygen: Secondary | ICD-10-CM | POA: Insufficient documentation

## 2016-08-15 DIAGNOSIS — Z880 Allergy status to penicillin: Secondary | ICD-10-CM | POA: Insufficient documentation

## 2016-08-15 DIAGNOSIS — Y842 Radiological procedure and radiotherapy as the cause of abnormal reaction of the patient, or of later complication, without mention of misadventure at the time of the procedure: Secondary | ICD-10-CM | POA: Insufficient documentation

## 2016-08-15 LAB — COMPREHENSIVE METABOLIC PANEL
ALT: 7 U/L — ABNORMAL LOW (ref 17–63)
ANION GAP: 6 (ref 5–15)
AST: 16 U/L (ref 15–41)
Albumin: 3.8 g/dL (ref 3.5–5.0)
Alkaline Phosphatase: 91 U/L (ref 38–126)
BILIRUBIN TOTAL: 0.6 mg/dL (ref 0.3–1.2)
BUN: 32 mg/dL — ABNORMAL HIGH (ref 6–20)
CALCIUM: 9.7 mg/dL (ref 8.9–10.3)
CO2: 29 mmol/L (ref 22–32)
Chloride: 101 mmol/L (ref 101–111)
Creatinine, Ser: 1.45 mg/dL — ABNORMAL HIGH (ref 0.61–1.24)
GFR calc non Af Amer: 46 mL/min — ABNORMAL LOW (ref >60)
GFR, EST AFRICAN AMERICAN: 54 mL/min — AB (ref >60)
Glucose, Bld: 233 mg/dL — ABNORMAL HIGH (ref 65–99)
POTASSIUM: 4.5 mmol/L (ref 3.5–5.1)
Sodium: 136 mmol/L (ref 135–145)
TOTAL PROTEIN: 7.6 g/dL (ref 6.5–8.1)

## 2016-08-15 LAB — CBC WITH DIFFERENTIAL/PLATELET
Basophils Absolute: 0.1 10*3/uL (ref 0–0.1)
Basophils Relative: 2 %
EOS ABS: 0.3 10*3/uL (ref 0–0.7)
Eosinophils Relative: 4 %
HEMATOCRIT: 34.4 % — AB (ref 40.0–52.0)
HEMOGLOBIN: 11.5 g/dL — AB (ref 13.0–18.0)
LYMPHS ABS: 1.3 10*3/uL (ref 1.0–3.6)
Lymphocytes Relative: 18 %
MCH: 28.2 pg (ref 26.0–34.0)
MCHC: 33.4 g/dL (ref 32.0–36.0)
MCV: 84.4 fL (ref 80.0–100.0)
MONO ABS: 0.5 10*3/uL (ref 0.2–1.0)
MONOS PCT: 7 %
NEUTROS ABS: 5 10*3/uL (ref 1.4–6.5)
NEUTROS PCT: 69 %
Platelets: 215 10*3/uL (ref 150–440)
RBC: 4.08 MIL/uL — ABNORMAL LOW (ref 4.40–5.90)
RDW: 16 % — AB (ref 11.5–14.5)
WBC: 7.2 10*3/uL (ref 3.8–10.6)

## 2016-08-15 NOTE — Assessment & Plan Note (Addendum)
#   LUL- hilar mass s/p RT. NED. No evidence of disease progression on the CT scan.   # RUL- spiculated nodule ~ 15.cm- stable from Nov 2017 PET scan [done at [facility]; but increased since July 2017; concerning for second primary; less likely metastatic disease. will discuss with Dr.Crystal regarding SB RT versus a follow-up CT scan in 6 months  # Advanced COPD poor performance status on home O2- STBALE/ 6Lit.   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  # descending colon uptake- ? Malignancy; had previous colo [2-3 years]. Declines colonoscopy. I think is reasonable. Given his tenuous respiratory status.  # follow up CT scan in 6 months; MD/;labs.  Dr.Sparks/ Dr.Fleming.

## 2016-08-15 NOTE — Progress Notes (Signed)
Patient here today for follow up.   

## 2016-08-15 NOTE — Progress Notes (Signed)
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  2.Patient was evaluated because of abnormal CT scan  dated March 15 of 2016A she had a 6 mm right upper lobe pulmonary nodule which has likely an largest and I will up since last CT scan of 2014 Patient has previously treated left lung cancer with radiation changes 3.  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  Repeat CT scan revealed complete resolution of  Pancreatic  Mass. 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  [facility] lower [facility]; descending colon uptake".  Patient continues to have chronic shortness of breath-on 3 L of nasal oxygen; chronic mild chronic cough. Not any worse. No fevers. No chills.  No hemoptysis. He has not had any recent hospitalizations. He goes around with a rolling walker.  ROS: A complete 10 point review of system is done which is negative for mentioned above in history of present illness  I have reviewed the past medical history, past surgical history, social history and family history with the patient and they are unchanged from previous  note.  ALLERGIES:  is allergic to ampicillin; penicillins; and zyban [bupropion].  MEDICATIONS:  Current Outpatient Prescriptions  Medication Sig Dispense Refill  . albuterol (PROVENTIL HFA;VENTOLIN HFA) 108 (90 Base) MCG/ACT inhaler Inhale 2 puffs into the lungs every 6 (six) hours as needed for wheezing or shortness of breath.    .Marland Kitchenalbuterol (PROVENTIL) (2.5 MG/3ML) 0.083% nebulizer solution Take 2.5 mg by nebulization every 4 (four) hours as needed for wheezing or shortness of breath.     .Marland KitchenBREO ELLIPTA 100-25 MCG/INH AEPB Inhale 1 puff into the lungs daily.     .Marland KitchenbuPROPion (WELLBUTRIN) 100 MG tablet Take 100 mg by mouth 2 (two) times daily.    . cetirizine (ZYRTEC) 10 MG tablet Take 10 mg by mouth daily.     . citalopram (CELEXA) 20 MG tablet Take 20 mg by mouth daily.     .Marland Kitchendiltiazem (DILACOR XR) 180 MG 24 hr capsule Take 180 mg by mouth daily.    . enalapril (VASOTEC) 5 MG tablet TAKE 1 TABLET (5 MG TOTAL) BY MOUTH ONCE DAILY.  1  . fluvastatin XL (LESCOL XL) 80 MG 24 hr tablet Take 80 mg by mouth daily.    . furosemide (LASIX) 20 MG tablet Take 20 mg by mouth daily.     .Marland Kitchengabapentin (NEURONTIN) 300 MG capsule Take 300 mg by mouth daily.     . hydrochlorothiazide (HYDRODIURIL) 25 MG tablet TAKE 1 TABLET (25 MG TOTAL) BY MOUTH ONCE DAILY.  1  . hydrOXYzine (ATARAX/VISTARIL) 10 MG tablet Take 10 mg by mouth at bedtime.    . insulin aspart (NOVOLOG) 100 UNIT/ML injection Inject 2-28 Units into the skin 3 (three) times daily with meals as needed for high blood sugar. Pt uses as needed per sliding scale.    . insulin detemir (LEVEMIR) 100 UNIT/ML injection Inject 20-40 Units into the skin 2 (two) times daily. Pt uses 20 units in the morning and 40 units at night.    . magnesium oxide (MAG-OX) 400 MG tablet Take 400 mg by mouth daily.     Marland Kitchen omeprazole (PRILOSEC) 20 MG capsule Take 20 mg by mouth 2 (two) times daily before a meal.    . pantoprazole (PROTONIX) 40 MG tablet Take 40 mg by mouth 2  (two) times daily before a meal.     . PARoxetine (PAXIL) 40 MG tablet Take 40 mg by mouth 2 (two) times daily.     . pravastatin (PRAVACHOL) 80 MG tablet Take 80 mg by mouth at bedtime.     . sildenafil (VIAGRA) 100 MG tablet Take 100 mg by mouth daily as needed for erectile dysfunction.     . tamsulosin (FLOMAX) 0.4 MG CAPS capsule Take 0.4 mg by mouth at bedtime.    . theophylline (THEO-24) 300 MG 24 hr capsule Take 300 mg by mouth daily.    Marland Kitchen tiotropium (SPIRIVA) 18 MCG inhalation capsule Place 18 mcg into inhaler and inhale daily.     Marland Kitchen tiZANidine (ZANAFLEX) 4 MG tablet Take 4 mg by mouth at bedtime.    . traZODone (DESYREL) 100 MG tablet Take 100 mg by mouth at bedtime.     No current facility-administered medications for this visit.     PHYSICAL EXAMINATION: ECOG PERFORMANCE STATUS: 3 - Symptomatic, >50% confined to bed  Vitals:   08/15/16 1122  BP: 138/79  Pulse: 97  Temp: (!) 96.4 F (35.8 C)   Filed Weights   08/15/16 1122  Weight: 165 lb 4 oz (75 kg)    GENERAL:alert, no distress and comfortable; . Home O2.He walks with a rolling walker. He is Accompanied by family. SKIN: skin color, texture, turgor are normal, no rashes or significant lesions EYES: normal, Conjunctiva are pink and non-injected, sclera clear OROPHARYNX:no exudate, no erythema and lips, buccal mucosa, and tongue normal  NECK: supple, thyroid normal size, non-tender, without nodularity LYMPH:  no palpable lymphadenopathy in the cervical, axillary or inguinal LUNGS: Decreased air entry bilaterally to auscultation; intermittent wheezing. No crackles HEART: regular rate & rhythm and no murmurs and no lower extremity edema ABDOMEN:abdomen soft, non-tender and normal bowel sounds Musculoskeletal:no cyanosis of digits and no clubbing  NEURO: alert & oriented x 3 with fluent speech, no focal motor/sensory deficits  LABORATORY DATA:  I have reviewed the data as listed    Component Value Date/Time   NA 136  08/15/2016 1054   NA 139 05/19/2014 0343   K 4.5 08/15/2016 1054   K 4.4 05/19/2014 0343   CL 101 08/15/2016 1054   CL 105 05/19/2014 0343   CO2 29 08/15/2016 1054   CO2 30 05/19/2014 0343   GLUCOSE 233 (H) 08/15/2016 1054   GLUCOSE 134 (H) 05/19/2014 0343   BUN 32 (H) 08/15/2016 1054   BUN 29 (H) 05/19/2014 0343   CREATININE 1.45 (H) 08/15/2016 1054   CREATININE 1.18 05/19/2014 0343   CALCIUM 9.7 08/15/2016 1054   CALCIUM 8.3 (L) 05/19/2014 0343   PROT 7.6 08/15/2016 1054  PROT 7.3 05/16/2014 1941   ALBUMIN 3.8 08/15/2016 1054   ALBUMIN 3.9 05/16/2014 1941   AST 16 08/15/2016 1054   AST 15 05/16/2014 1941   ALT 7 (L) 08/15/2016 1054   ALT 21 05/16/2014 1941   ALKPHOS 91 08/15/2016 1054   ALKPHOS 111 05/16/2014 1941   BILITOT 0.6 08/15/2016 1054   BILITOT 0.7 05/16/2014 1941   GFRNONAA 46 (L) 08/15/2016 1054   GFRNONAA >60 05/19/2014 0343   GFRNONAA 60 (L) 08/29/2013 1559   GFRAA 54 (L) 08/15/2016 1054   GFRAA >60 05/19/2014 0343   GFRAA >60 08/29/2013 1559    No results found for: SPEP, UPEP  Lab Results  Component Value Date   WBC 7.2 08/15/2016   NEUTROABS 5.0 08/15/2016   HGB 11.5 (L) 08/15/2016   HCT 34.4 (L) 08/15/2016   MCV 84.4 08/15/2016   PLT 215 08/15/2016      Chemistry      Component Value Date/Time   NA 136 08/15/2016 1054   NA 139 05/19/2014 0343   K 4.5 08/15/2016 1054   K 4.4 05/19/2014 0343   CL 101 08/15/2016 1054   CL 105 05/19/2014 0343   CO2 29 08/15/2016 1054   CO2 30 05/19/2014 0343   BUN 32 (H) 08/15/2016 1054   BUN 29 (H) 05/19/2014 0343   CREATININE 1.45 (H) 08/15/2016 1054   CREATININE 1.18 05/19/2014 0343      Component Value Date/Time   CALCIUM 9.7 08/15/2016 1054   CALCIUM 8.3 (L) 05/19/2014 0343   ALKPHOS 91 08/15/2016 1054   ALKPHOS 111 05/16/2014 1941   AST 16 08/15/2016 1054   AST 15 05/16/2014 1941   ALT 7 (L) 08/15/2016 1054   ALT 21 05/16/2014 1941   BILITOT 0.6 08/15/2016 1054   BILITOT 0.7 05/16/2014  1941     IMPRESSION: Radiation changes in the left upper lobe/perihilar region.  Mild progression of the spiculated right upper lobe pulmonary nodule, now measuring 13 x 10 mm, highly suspicious for metachronous primary bronchogenic neoplasm or metastatic disease.   Electronically Signed   By: Julian Hy M.D.   On: 08/08/2016 08:54  RADIOGRAPHIC STUDIES: I have personally reviewed the radiological images as listed and agreed with the findings in the report. No results found.     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 NED. No evidence of disease progression on the CT scan.   # RUL- spiculated nodule ~ 15.cm- stable from Nov 2017 PET scan [done at [facility]; but increased since July 2017; concerning for second primary; less likely metastatic disease. will discuss with Dr.Crystal regarding SB RT versus a follow-up CT scan in 6 months  # Advanced COPD poor performance status on home O2- STBALE/ 6Lit.   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  # descending colon uptake- ? Malignancy; had previous colo [2-3 years]. Declines colonoscopy. I think is reasonable. Given his tenuous respiratory status.  # follow up CT scan in 6 months; MD/;labs.  Dr.Sparks/ Dr.Fleming.   Orders Placed This Encounter  Procedures  . CT CHEST WO CONTRAST    Standing Status:   Future    Standing Expiration Date:   10/15/2017    Order Specific Question:   Reason for Exam (SYMPTOM  OR DIAGNOSIS REQUIRED)    Answer:   lung cancer; lung nodule    Order Specific Question:   Preferred imaging location?    Answer:   [facility]  .  CBC with Differential/Platelet    Standing Status:   Future    Standing Expiration Date:   05/15/2017  . Basic metabolic panel    Standing Status:   Future    Standing Expiration Date:   05/15/2017     Cammie Sickle, MD 08/15/2016 5:24 PM

## 2017-02-11 ENCOUNTER — Inpatient Hospital Stay: Payer: Medicare Other | Attending: Internal Medicine

## 2017-02-11 ENCOUNTER — Ambulatory Visit
Admission: RE | Admit: 2017-02-11 | Discharge: 2017-02-11 | Disposition: A | Discharge status: Home / Self Care | Payer: Medicare Other | Source: Ambulatory Visit | Attending: Internal Medicine | Admitting: Internal Medicine

## 2017-02-11 DIAGNOSIS — R911 Solitary pulmonary nodule: Secondary | ICD-10-CM | POA: Insufficient documentation

## 2017-02-11 DIAGNOSIS — C3412 Malignant neoplasm of upper lobe, left bronchus or lung: Secondary | ICD-10-CM | POA: Insufficient documentation

## 2017-02-11 DIAGNOSIS — Z880 Allergy status to penicillin: Secondary | ICD-10-CM | POA: Insufficient documentation

## 2017-02-11 DIAGNOSIS — Z79899 Other long term (current) drug therapy: Secondary | ICD-10-CM | POA: Insufficient documentation

## 2017-02-11 DIAGNOSIS — Y842 Radiological procedure and radiotherapy as the cause of abnormal reaction of the patient, or of later complication, without mention of misadventure at the time of the procedure: Secondary | ICD-10-CM | POA: Insufficient documentation

## 2017-02-11 DIAGNOSIS — N183 Chronic kidney disease, stage 3 (moderate): Secondary | ICD-10-CM | POA: Insufficient documentation

## 2017-02-11 DIAGNOSIS — I700 Atherosclerosis of aorta: Secondary | ICD-10-CM | POA: Insufficient documentation

## 2017-02-11 DIAGNOSIS — Z9221 Personal history of antineoplastic chemotherapy: Secondary | ICD-10-CM | POA: Insufficient documentation

## 2017-02-11 DIAGNOSIS — J449 Chronic obstructive pulmonary disease, unspecified: Secondary | ICD-10-CM | POA: Insufficient documentation

## 2017-02-11 DIAGNOSIS — J439 Emphysema, unspecified: Secondary | ICD-10-CM | POA: Diagnosis not present

## 2017-02-11 DIAGNOSIS — Z794 Long term (current) use of insulin: Secondary | ICD-10-CM | POA: Insufficient documentation

## 2017-02-11 DIAGNOSIS — Z923 Personal history of irradiation: Secondary | ICD-10-CM | POA: Insufficient documentation

## 2017-02-11 DIAGNOSIS — Z9981 Dependence on supplemental oxygen: Secondary | ICD-10-CM | POA: Insufficient documentation

## 2017-02-13 ENCOUNTER — Encounter: Payer: Self-pay | Admitting: *Deleted

## 2017-02-13 ENCOUNTER — Inpatient Hospital Stay (HOSPITAL_BASED_OUTPATIENT_CLINIC_OR_DEPARTMENT_OTHER): Payer: Medicare Other | Admitting: Internal Medicine

## 2017-02-13 VITALS — BP 152/74 | HR 101 | Temp 96.8°F | Wt 170.1 lb

## 2017-02-13 DIAGNOSIS — Z79899 Other long term (current) drug therapy: Secondary | ICD-10-CM

## 2017-02-13 DIAGNOSIS — C3412 Malignant neoplasm of upper lobe, left bronchus or lung: Secondary | ICD-10-CM | POA: Diagnosis present

## 2017-02-13 DIAGNOSIS — I700 Atherosclerosis of aorta: Secondary | ICD-10-CM

## 2017-02-13 DIAGNOSIS — Y842 Radiological procedure and radiotherapy as the cause of abnormal reaction of the patient, or of later complication, without mention of misadventure at the time of the procedure: Secondary | ICD-10-CM

## 2017-02-13 DIAGNOSIS — N183 Chronic kidney disease, stage 3 (moderate): Secondary | ICD-10-CM

## 2017-02-13 DIAGNOSIS — Z9221 Personal history of antineoplastic chemotherapy: Secondary | ICD-10-CM | POA: Diagnosis not present

## 2017-02-13 DIAGNOSIS — Z9981 Dependence on supplemental oxygen: Secondary | ICD-10-CM | POA: Diagnosis not present

## 2017-02-13 DIAGNOSIS — Z794 Long term (current) use of insulin: Secondary | ICD-10-CM | POA: Diagnosis not present

## 2017-02-13 DIAGNOSIS — R911 Solitary pulmonary nodule: Secondary | ICD-10-CM | POA: Diagnosis not present

## 2017-02-13 DIAGNOSIS — Z880 Allergy status to penicillin: Secondary | ICD-10-CM

## 2017-02-13 DIAGNOSIS — Z923 Personal history of irradiation: Secondary | ICD-10-CM

## 2017-02-13 DIAGNOSIS — J449 Chronic obstructive pulmonary disease, unspecified: Secondary | ICD-10-CM | POA: Diagnosis not present

## 2017-02-13 NOTE — Assessment & Plan Note (Addendum)
#   RUL- spiculated nodule ~ 54mm/ slowly but surely increasing since CT scan from March 2017. I would recommend a PET scan for further evaluation. I discussed with Dr. Donella Stade regarding SB RT.   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 Await PET scan as ordered above.   # Advanced COPD poor performance status on home O2- STBALE/ 6Lit.   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  # descending colon uptake- ? Malignancy; had previous colo [2-3 years]. Reluctant to have colonoscopies. Again await PET scan.  # follow up in 6 week/bmp. We will review the tumor conference; discussed with nurse navigator.  Dr.Sparks/ Dr.Fleming.  # I reviewed the blood work- with the patient in detail; also reviewed the imaging independently [as summarized above]; and with the patient in detail.

## 2017-02-13 NOTE — Progress Notes (Signed)
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  2.Patient was evaluated because of abnormal CT scan  dated March 15 of 2016A she had a 6 mm right upper lobe pulmonary nodule which has likely an largest and I will up since last CT scan of 2014 Patient has previously treated left lung cancer with radiation changes 3.  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  Repeat CT scan revealed complete resolution of  Pancreatic  Mass. 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  Patient continues to be on 6 L of oxygen chronic for his long-standing COPD. Otherwise denies any depression the hospital for COPD exacerbation or acute respiratory failure.  His ambulation is limited because of severe COPD. Otherwise appetite is good. No headaches.  He goes around with a rolling walker.  ROS: A complete 10 point review of system is done which is negative for mentioned above in history of present illness  I have reviewed the past medical history, past surgical history, social history and family history with the patient and they are unchanged from previous note.  ALLERGIES:  is allergic to ampicillin; penicillins; and zyban  [bupropion].  MEDICATIONS:  Current Outpatient Prescriptions  Medication Sig Dispense Refill  . albuterol (PROVENTIL HFA;VENTOLIN HFA) 108 (90 Base) MCG/ACT inhaler Inhale 2 puffs into the lungs every 6 (six) hours as needed for wheezing or shortness of breath.    Marland Kitchen albuterol (PROVENTIL) (2.5 MG/3ML) 0.083% nebulizer solution Take 2.5 mg by nebulization every 4 (four) hours as needed for wheezing or shortness of breath.     Marland Kitchen BREO ELLIPTA 100-25 MCG/INH AEPB Inhale 1 puff into the lungs daily.     Marland Kitchen buPROPion (WELLBUTRIN) 100 MG tablet Take 100 mg by mouth 2 (two) times daily.    . cetirizine (ZYRTEC) 10 MG tablet Take 10 mg by mouth daily.     . citalopram (CELEXA) 20 MG tablet Take 20 mg by mouth daily.     Marland Kitchen diltiazem (DILACOR XR) 180 MG 24 hr capsule Take 180 mg by mouth daily.    . enalapril (VASOTEC) 5 MG tablet TAKE 1 TABLET (5 MG TOTAL) BY MOUTH ONCE DAILY.  1  . fluvastatin XL (LESCOL XL) 80 MG 24 hr tablet Take 80 mg by mouth daily.    . furosemide (LASIX) 20 MG tablet Take 20 mg by mouth daily.     Marland Kitchen gabapentin (NEURONTIN) 300 MG capsule Take 300 mg by mouth daily.     . hydrochlorothiazide (HYDRODIURIL) 25 MG tablet TAKE 1 TABLET (25 MG TOTAL) BY MOUTH ONCE DAILY.  1  . hydrOXYzine (ATARAX/VISTARIL) 10 MG tablet Take 10 mg by mouth at bedtime.    Marland Kitchen  insulin aspart (NOVOLOG) 100 UNIT/ML injection Inject 2-28 Units into the skin 3 (three) times daily with meals as needed for high blood sugar. Pt uses as needed per sliding scale.    . insulin detemir (LEVEMIR) 100 UNIT/ML injection Inject 20-40 Units into the skin 2 (two) times daily. Pt uses 20 units in the morning and 40 units at night.    . magnesium oxide (MAG-OX) 400 MG tablet Take 400 mg by mouth daily.     Marland Kitchen omeprazole (PRILOSEC) 20 MG capsule Take 20 mg by mouth 2 (two) times daily before a meal.    . pantoprazole (PROTONIX) 40 MG tablet Take 40 mg by mouth 2 (two) times daily before a meal.     . PARoxetine (PAXIL) 40 MG  tablet Take 40 mg by mouth 2 (two) times daily.     . pravastatin (PRAVACHOL) 80 MG tablet Take 80 mg by mouth at bedtime.     . sildenafil (VIAGRA) 100 MG tablet Take 100 mg by mouth daily as needed for erectile dysfunction.     . tamsulosin (FLOMAX) 0.4 MG CAPS capsule Take 0.4 mg by mouth at bedtime.    . theophylline (THEO-24) 300 MG 24 hr capsule Take 300 mg by mouth daily.    Marland Kitchen tiotropium (SPIRIVA) 18 MCG inhalation capsule Place 18 mcg into inhaler and inhale daily.     Marland Kitchen tiZANidine (ZANAFLEX) 4 MG tablet Take 4 mg by mouth at bedtime.    . traZODone (DESYREL) 100 MG tablet Take 100 mg by mouth at bedtime.     No current facility-administered medications for this visit.     PHYSICAL EXAMINATION: ECOG PERFORMANCE STATUS: 3 - Symptomatic, >50% confined to bed  Vitals:   02/13/17 1111  BP: (!) 152/74  Pulse: (!) 101  Temp: (!) 96.8 F (36 C)   Filed Weights   02/13/17 1111  Weight: 170 lb 1.6 oz (77.2 kg)    GENERAL:alert, no distress and comfortable; . Home O2.He walks with a rolling walker. He is Accompanied by family. SKIN: skin color, texture, turgor are normal, no rashes or significant lesions EYES: normal, Conjunctiva are pink and non-injected, sclera clear OROPHARYNX:no exudate, no erythema and lips, buccal mucosa, and tongue normal  NECK: supple, thyroid normal size, non-tender, without nodularity LYMPH:  no palpable lymphadenopathy in the cervical, axillary or inguinal LUNGS: Decreased air entry bilaterally to auscultation; intermittent wheezing. No crackles HEART: regular rate & rhythm and no murmurs and no lower extremity edema ABDOMEN:abdomen soft, non-tender and normal bowel sounds Musculoskeletal:no cyanosis of digits and no clubbing  NEURO: alert & oriented x 3 with fluent speech, no focal motor/sensory deficits  LABORATORY DATA:  I have reviewed the data as listed    Component Value Date/Time   NA 136 08/15/2016 1054   NA 139 05/19/2014 0343   K 4.5  08/15/2016 1054   K 4.4 05/19/2014 0343   CL 101 08/15/2016 1054   CL 105 05/19/2014 0343   CO2 29 08/15/2016 1054   CO2 30 05/19/2014 0343   GLUCOSE 233 (H) 08/15/2016 1054   GLUCOSE 134 (H) 05/19/2014 0343   BUN 32 (H) 08/15/2016 1054   BUN 29 (H) 05/19/2014 0343   CREATININE 1.45 (H) 08/15/2016 1054   CREATININE 1.18 05/19/2014 0343   CALCIUM 9.7 08/15/2016 1054   CALCIUM 8.3 (L) 05/19/2014 0343   PROT 7.6 08/15/2016 1054   PROT 7.3 05/16/2014 1941   ALBUMIN 3.8 08/15/2016 1054   ALBUMIN 3.9 05/16/2014 1941  AST 16 08/15/2016 1054   AST 15 05/16/2014 1941   ALT 7 (L) 08/15/2016 1054   ALT 21 05/16/2014 1941   ALKPHOS 91 08/15/2016 1054   ALKPHOS 111 05/16/2014 1941   BILITOT 0.6 08/15/2016 1054   BILITOT 0.7 05/16/2014 1941   GFRNONAA 46 (L) 08/15/2016 1054   GFRNONAA >60 05/19/2014 0343   GFRNONAA 60 (L) 08/29/2013 1559   GFRAA 54 (L) 08/15/2016 1054   GFRAA >60 05/19/2014 0343   GFRAA >60 08/29/2013 1559    No results found for: SPEP, UPEP  Lab Results  Component Value Date   WBC 7.2 08/15/2016   NEUTROABS 5.0 08/15/2016   HGB 11.5 (L) 08/15/2016   HCT 34.4 (L) 08/15/2016   MCV 84.4 08/15/2016   PLT 215 08/15/2016      Chemistry      Component Value Date/Time   NA 136 08/15/2016 1054   NA 139 05/19/2014 0343   K 4.5 08/15/2016 1054   K 4.4 05/19/2014 0343   CL 101 08/15/2016 1054   CL 105 05/19/2014 0343   CO2 29 08/15/2016 1054   CO2 30 05/19/2014 0343   BUN 32 (H) 08/15/2016 1054   BUN 29 (H) 05/19/2014 0343   CREATININE 1.45 (H) 08/15/2016 1054   CREATININE 1.18 05/19/2014 0343      Component Value Date/Time   CALCIUM 9.7 08/15/2016 1054   CALCIUM 8.3 (L) 05/19/2014 0343   ALKPHOS 91 08/15/2016 1054   ALKPHOS 111 05/16/2014 1941   AST 16 08/15/2016 1054   AST 15 05/16/2014 1941   ALT 7 (L) 08/15/2016 1054   ALT 21 05/16/2014 1941   BILITOT 0.6 08/15/2016 1054   BILITOT 0.7 05/16/2014 1941     IMPRESSION: Radiation changes in the  left upper lobe/perihilar region.  Mild progression of the spiculated right upper lobe pulmonary nodule, now measuring 13 x 10 mm, highly suspicious for metachronous primary bronchogenic neoplasm or metastatic disease.   Electronically Signed   By: Julian Hy M.D.   On: 08/08/2016 08:54  IMPRESSION: 1. Stable soft tissue density and radiation changes in the left hilar region. 2. Slight interval enlargement of the spiculated right upper lobe nodule now measuring 14.5 x 12.5 x 10.0 mm. 3. Stable small scattered mediastinal and hilar lymph nodes. 4. Stable small bilateral adrenal gland nodules. 5. Advanced emphysematous changes but no new pulmonary nodules or acute overlying pulmonary process.  Aortic Atherosclerosis (ICD10-I70.0) and Emphysema (ICD10-J43.9).   Electronically Signed   By: Marijo Sanes M.D.   On: 02/11/2017 13:50  RADIOGRAPHIC STUDIES: I have personally reviewed the radiological images as listed and agreed with the findings in the report. No results found.     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 I would recommend a PET scan for further evaluation. I discussed with Dr. Donella Stade regarding SB RT.   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 Await PET scan as ordered above.   # Advanced COPD poor performance status on home O2- STBALE/ 6Lit.   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  # descending colon uptake- ? Malignancy; had previous colo [2-3 years]. Reluctant to have colonoscopies. Again await PET scan.  # follow up in 6 week/bmp. We will review the tumor conference; discussed with nurse navigator.  Dr.Sparks/ Dr.Fleming.  # I reviewed the blood  work- with the patient in detail; also reviewed the imaging independently [as summarized  above]; and with the patient in detail.     Orders Placed This Encounter  Procedures  . NM PET Image Restag (PS) Skull Base To Thigh    Standing Status:   Future    Standing Expiration Date:   02/13/2018    Order Specific Question:   If indicated for the ordered procedure, I authorize the administration of a radiopharmaceutical per Radiology protocol    Answer:   Yes    Order Specific Question:   Preferred imaging location?    Answer:   [facility]    Order Specific Question:   Radiology Contrast Protocol - do NOT remove file path    Answer:   [email or web address]    Order Specific Question:   Reason for Exam additional comments    Answer:   slowly enlarging rul lung nodule  . Oxygen therapy    6 liters of oxygen. Goal to keep sats above 92% while patient in clinic    Standing Status:   Future    Number of Occurrences:   1    Standing Expiration Date:   02/13/2018     Cammie Sickle, MD 02/13/2017 1:20 PM

## 2017-02-13 NOTE — Progress Notes (Signed)
Patient is here today for a follow up. Patient states no new concerns today.  

## 2017-02-13 NOTE — Progress Notes (Signed)
  Oncology Nurse Navigator Documentation  Navigator Location: [facility] (02/13/17 1300)   )Navigator Encounter Type: Other (orders entered) (02/13/17 1300)                                          Acuity: Level 2 (02/13/17 1300)   Acuity Level 2: Assistance expediting appointments;Initial guidance, education and coordination as needed (02/13/17 1300)    Spoke with Dr. B regarding ensuring pt is scheduled for PET scan as soon as possible. MD would like to present pt at tumor board Thursday. Pt scheduled for PET on Monday 8/13. Order entered to add to conference the following Thursday. Will continue to follow and assist when needed.    Time Spent with Patient: 15 (02/13/17 1300)

## 2017-02-16 ENCOUNTER — Ambulatory Visit
Admission: RE | Admit: 2017-02-16 | Discharge: 2017-02-16 | Disposition: A | Discharge status: Home / Self Care | Payer: Medicare Other | Source: Ambulatory Visit | Attending: Internal Medicine | Admitting: Internal Medicine

## 2017-02-16 DIAGNOSIS — I700 Atherosclerosis of aorta: Secondary | ICD-10-CM | POA: Diagnosis not present

## 2017-02-16 DIAGNOSIS — J432 Centrilobular emphysema: Secondary | ICD-10-CM | POA: Diagnosis not present

## 2017-02-16 DIAGNOSIS — C3412 Malignant neoplasm of upper lobe, left bronchus or lung: Secondary | ICD-10-CM | POA: Insufficient documentation

## 2017-02-16 DIAGNOSIS — N400 Enlarged prostate without lower urinary tract symptoms: Secondary | ICD-10-CM | POA: Insufficient documentation

## 2017-02-16 DIAGNOSIS — Z79899 Other long term (current) drug therapy: Secondary | ICD-10-CM | POA: Diagnosis not present

## 2017-02-16 DIAGNOSIS — R911 Solitary pulmonary nodule: Secondary | ICD-10-CM | POA: Insufficient documentation

## 2017-02-16 LAB — GLUCOSE, CAPILLARY: Glucose-Capillary: 160 mg/dL — ABNORMAL HIGH (ref 65–99)

## 2017-02-16 MED ORDER — FLUDEOXYGLUCOSE F - 18 (FDG) INJECTION
12.0000 | Freq: Once | INTRAVENOUS | Status: AC | PRN
  Administered 2017-02-16: 12.66 via INTRAVENOUS

## 2017-02-20 ENCOUNTER — Encounter: Payer: Self-pay | Admitting: Radiation Oncology

## 2017-02-20 ENCOUNTER — Ambulatory Visit
Admission: RE | Admit: 2017-02-20 | Discharge: 2017-02-20 | Disposition: A | Discharge status: Home / Self Care | Payer: Medicare Other | Source: Ambulatory Visit | Attending: Radiation Oncology | Admitting: Radiation Oncology

## 2017-02-20 VITALS — BP 139/79 | HR 101 | Temp 96.5°F

## 2017-02-20 DIAGNOSIS — E119 Type 2 diabetes mellitus without complications: Secondary | ICD-10-CM | POA: Diagnosis not present

## 2017-02-20 DIAGNOSIS — Z9981 Dependence on supplemental oxygen: Secondary | ICD-10-CM | POA: Diagnosis not present

## 2017-02-20 DIAGNOSIS — C3411 Malignant neoplasm of upper lobe, right bronchus or lung: Secondary | ICD-10-CM | POA: Diagnosis not present

## 2017-02-20 DIAGNOSIS — K219 Gastro-esophageal reflux disease without esophagitis: Secondary | ICD-10-CM | POA: Diagnosis not present

## 2017-02-20 DIAGNOSIS — F4310 Post-traumatic stress disorder, unspecified: Secondary | ICD-10-CM | POA: Diagnosis not present

## 2017-02-20 DIAGNOSIS — Z794 Long term (current) use of insulin: Secondary | ICD-10-CM | POA: Diagnosis not present

## 2017-02-20 DIAGNOSIS — Z79899 Other long term (current) drug therapy: Secondary | ICD-10-CM | POA: Insufficient documentation

## 2017-02-20 DIAGNOSIS — Z923 Personal history of irradiation: Secondary | ICD-10-CM | POA: Insufficient documentation

## 2017-02-20 DIAGNOSIS — Z87891 Personal history of nicotine dependence: Secondary | ICD-10-CM | POA: Insufficient documentation

## 2017-02-20 DIAGNOSIS — I10 Essential (primary) hypertension: Secondary | ICD-10-CM | POA: Insufficient documentation

## 2017-02-20 DIAGNOSIS — Z510 Encounter for antineoplastic radiation therapy: Secondary | ICD-10-CM | POA: Insufficient documentation

## 2017-02-20 DIAGNOSIS — J45909 Unspecified asthma, uncomplicated: Secondary | ICD-10-CM | POA: Insufficient documentation

## 2017-02-20 DIAGNOSIS — F329 Major depressive disorder, single episode, unspecified: Secondary | ICD-10-CM | POA: Diagnosis not present

## 2017-02-20 DIAGNOSIS — Z85118 Personal history of other malignant neoplasm of bronchus and lung: Secondary | ICD-10-CM | POA: Insufficient documentation

## 2017-02-20 DIAGNOSIS — C3490 Malignant neoplasm of unspecified part of unspecified bronchus or lung: Secondary | ICD-10-CM

## 2017-02-20 DIAGNOSIS — J449 Chronic obstructive pulmonary disease, unspecified: Secondary | ICD-10-CM | POA: Diagnosis not present

## 2017-02-20 DIAGNOSIS — I2510 Atherosclerotic heart disease of native coronary artery without angina pectoris: Secondary | ICD-10-CM | POA: Insufficient documentation

## 2017-02-20 DIAGNOSIS — Z7951 Long term (current) use of inhaled steroids: Secondary | ICD-10-CM | POA: Insufficient documentation

## 2017-02-20 NOTE — Consult Note (Signed)
NEW PATIENT EVALUATION  Name: Earl Clay  MRN: [id number]  Date:   02/20/2017     DOB: Jun 04, 1941   This 76 y.o. 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  REFERRING PHYSICIAN: [name], MD  CHIEF COMPLAINT:  Chief Complaint  Patient presents with  . Lung Cancer    Pt is here as an OPNA for new lung nodule     DIAGNOSIS: The encounter diagnosis was Malignant neoplasm of lung, unspecified laterality, unspecified part of lung ([place]).   PREVIOUS INVESTIGATIONS:  PET scan and CT scans reviewed Clinical notes reviewed Case presented at weekly tumor conference   HPI: Patient is a 76 year old male well-known to department having treated with SB RT to his left lung over 3 years prior. He has been followed closely with repeat CT scans and PET/CT scans noticing a right lung mass is progressing over time consistent with non-small cell lung cancer. Patient also had a pancreatic mass with slightly elevated CA 19-9 which has completely resolved on repeat CT scans. The lesion being followed is in the right upper lobe is slowly progressing in serial CT scans. His case was presented at our weekly tumor conference and recommendation for SB RT this lesion was made. Patient seen today he's had significant difficulty with his pulmonary functions he is totally oxygen dependent gets markedly short of breath with any type of exertion. He specifically denies cough hemoptysis or chest tightness. His ambulation is severely limited based on his pulmonary functions.  PLANNED TREATMENT REGIMEN: SB RT to the right lung  PAST MEDICAL HISTORY:  has a past medical history of Asthma; CAD (coronary artery disease); COPD (chronic obstructive pulmonary disease) ([place]); Depression; Diabetes mellitus without complication ([place]); Difficult intubation; GERD (gastroesophageal reflux disease); Hypertension; Lung cancer ([place]);  Oxygen dependent; Pancreatic mass; PTSD (post-traumatic stress disorder); and Sleep apnea.    PAST SURGICAL HISTORY:  Past Surgical History:  Procedure Laterality Date  . HERNIA REPAIR Right    right inguinal hernia repair  . KNEE SURGERY Right    right knee repair  . UPPER ESOPHAGEAL ENDOSCOPIC ULTRASOUND (EUS) N/A 09/20/2015   Procedure: UPPER ESOPHAGEAL ENDOSCOPIC ULTRASOUND (EUS);  Surgeon: [name], MD;  Location: [facility] ENDOSCOPY;  Service: Endoscopy;  Laterality: N/A;    FAMILY HISTORY: family history includes Congestive Heart Failure in his father; Hypertension in his mother.  SOCIAL HISTORY:  reports that he has quit smoking. He has never used smokeless tobacco.  ALLERGIES: Ampicillin; Penicillins; and Zyban [bupropion]  MEDICATIONS:  Current Outpatient Prescriptions  Medication Sig Dispense Refill  . albuterol (PROVENTIL HFA;VENTOLIN HFA) 108 (90 Base) MCG/ACT inhaler Inhale 2 puffs into the lungs every 6 (six) hours as needed for wheezing or shortness of breath.    Marland Kitchen albuterol (PROVENTIL) (2.5 MG/3ML) 0.083% nebulizer solution Take 2.5 mg by nebulization every 4 (four) hours as needed for wheezing or shortness of breath.     Marland Kitchen BREO ELLIPTA 100-25 MCG/INH AEPB Inhale 1 puff into the lungs daily.     Marland Kitchen buPROPion (WELLBUTRIN) 100 MG tablet Take 100 mg by mouth 2 (two) times daily.    . cetirizine (ZYRTEC) 10 MG tablet Take 10 mg by mouth daily.     . citalopram (CELEXA) 20 MG tablet Take 20 mg by mouth daily.     Marland Kitchen diltiazem (DILACOR XR) 180 MG 24 hr capsule Take 180 mg by mouth daily.    Marland Kitchen  enalapril (VASOTEC) 5 MG tablet TAKE 1 TABLET (5 MG TOTAL) BY MOUTH ONCE DAILY.  1  . fluvastatin XL (LESCOL XL) 80 MG 24 hr tablet Take 80 mg by mouth daily.    . furosemide (LASIX) 20 MG tablet Take 20 mg by mouth daily.     Marland Kitchen gabapentin (NEURONTIN) 300 MG capsule Take 300 mg by mouth daily.     . hydrochlorothiazide (HYDRODIURIL) 25 MG tablet TAKE 1 TABLET (25 MG TOTAL) BY  MOUTH ONCE DAILY.  1  . hydrOXYzine (ATARAX/VISTARIL) 10 MG tablet Take 10 mg by mouth at bedtime.    . insulin aspart (NOVOLOG) 100 UNIT/ML injection Inject 2-28 Units into the skin 3 (three) times daily with meals as needed for high blood sugar. Pt uses as needed per sliding scale.    . insulin detemir (LEVEMIR) 100 UNIT/ML injection Inject 20-40 Units into the skin 2 (two) times daily. Pt uses 20 units in the morning and 40 units at night.    Marland Kitchen LORazepam (ATIVAN) 0.5 MG tablet     . magnesium oxide (MAG-OX) 400 MG tablet Take 400 mg by mouth daily.     Marland Kitchen omeprazole (PRILOSEC) 20 MG capsule Take 20 mg by mouth 2 (two) times daily before a meal.    . pantoprazole (PROTONIX) 40 MG tablet Take 40 mg by mouth 2 (two) times daily before a meal.     . PARoxetine (PAXIL) 40 MG tablet Take 40 mg by mouth 2 (two) times daily.     . pravastatin (PRAVACHOL) 80 MG tablet Take 80 mg by mouth at bedtime.     . sildenafil (VIAGRA) 100 MG tablet Take 100 mg by mouth daily as needed for erectile dysfunction.     . tamsulosin (FLOMAX) 0.4 MG CAPS capsule Take 0.4 mg by mouth at bedtime.    . theophylline (THEO-24) 300 MG 24 hr capsule Take 300 mg by mouth daily.    Marland Kitchen tiotropium (SPIRIVA) 18 MCG inhalation capsule Place 18 mcg into inhaler and inhale daily.     Marland Kitchen tiZANidine (ZANAFLEX) 4 MG tablet Take 4 mg by mouth at bedtime.    . traZODone (DESYREL) 100 MG tablet Take 100 mg by mouth at bedtime.     No current facility-administered medications for this encounter.     ECOG PERFORMANCE STATUS:  0 - Asymptomatic  REVIEW OF SYSTEMS: Patient has significant pulmonary function problems causing this extreme difficulty ambulating otherwise Patient denies any weight loss, fatigue, weakness, fever, chills or night sweats. Patient denies any loss of vision, blurred vision. Patient denies any ringing  of the ears or hearing loss. No irregular heartbeat. Patient denies heart murmur or history of fainting. Patient denies  any chest pain or pain radiating to her upper extremities. Patient denies any shortness of breath, difficulty breathing at night, cough or hemoptysis. Patient denies any swelling in the lower legs. Patient denies any nausea vomiting, vomiting of blood, or coffee ground material in the vomitus. Patient denies any stomach pain. Patient states has had normal bowel movements no significant constipation or diarrhea. Patient denies any dysuria, hematuria or significant nocturia. Patient denies any problems walking, swelling in the joints or loss of balance. Patient denies any skin changes, loss of hair or loss of weight. Patient denies any excessive worrying or anxiety or significant depression. Patient denies any problems with insomnia. Patient denies excessive thirst, polyuria, polydipsia. Patient denies any swollen glands, patient denies easy bruising or easy bleeding. Patient denies any recent infections, allergies  or URI. Patient "s visual fields have not changed significantly in recent time.    PHYSICAL EXAM: BP 139/79   Pulse (!) 101   Temp (!) 96.5 F (35.8 C)  Well-developed frail-appearing male wheelchair-bound on nasal oxygen in NAD. Well-developed well-nourished patient in NAD. HEENT reveals PERLA, EOMI, discs not visualized.  Oral cavity is clear. No oral mucosal lesions are identified. Neck is clear without evidence of cervical or supraclavicular adenopathy. Lungs are clear to A&P. Cardiac examination is essentially unremarkable with regular rate and rhythm without murmur rub or thrill. Abdomen is benign with no organomegaly or masses noted. Motor sensory and DTR levels are equal and symmetric in the upper and lower extremities. Cranial nerves II through XII are grossly intact. Proprioception is intact. No peripheral adenopathy or edema is identified. No motor or sensory levels are noted. Crude visual fields are within normal range.  LABORATORY DATA: No pathology report for review    RADIOLOGY  RESULTS: Serial CT scans and PET/CT scans reviewed and compatible with the above-stated findings   IMPRESSION: Stage I non-small cell lung cancer of the right upper lobe in 76 year old male treated 3 years prior for a left lung stage I disease  PLAN: At this time I to go ahead with SB RT to his right lung. Would plan on delivering 5000 cGy in 5 fractions. I've personally ordered and scheduled CT simulation would 4D study for next week. Risks and benefits of treatment including possible fatigue possible development of cough all were discussed in detail with the patient. He tolerated his last treatment was extremely well without side effect. Patient comprehends my treatment plan well. We also discussed possible biopsy this lesion in tumor conference and based on the patient's extreme poor well-nourished functions this will be high risk. All attending physicians agree this is a progressive non-small cell lung cancer.  I would like to take this opportunity to thank you for allowing me to participate in the care of your patient.Armstead Peaks., MD

## 2017-02-26 ENCOUNTER — Ambulatory Visit
Admission: RE | Admit: 2017-02-26 | Discharge: 2017-02-26 | Disposition: A | Discharge status: Home / Self Care | Payer: Medicare Other | Source: Ambulatory Visit | Attending: Radiation Oncology | Admitting: Radiation Oncology

## 2017-02-26 DIAGNOSIS — Z510 Encounter for antineoplastic radiation therapy: Secondary | ICD-10-CM | POA: Diagnosis not present

## 2017-03-11 ENCOUNTER — Ambulatory Visit: Payer: Medicare Other | Admitting: Radiation Oncology

## 2017-03-11 DIAGNOSIS — Z510 Encounter for antineoplastic radiation therapy: Secondary | ICD-10-CM | POA: Diagnosis not present

## 2017-03-12 ENCOUNTER — Ambulatory Visit: Payer: Medicare Other | Admitting: Radiation Oncology

## 2017-03-13 DIAGNOSIS — Z510 Encounter for antineoplastic radiation therapy: Secondary | ICD-10-CM | POA: Diagnosis not present

## 2017-03-17 ENCOUNTER — Ambulatory Visit
Admission: RE | Admit: 2017-03-17 | Discharge: 2017-03-17 | Disposition: A | Discharge status: Home / Self Care | Payer: Medicare Other | Source: Ambulatory Visit | Attending: Radiation Oncology | Admitting: Radiation Oncology

## 2017-03-17 DIAGNOSIS — Z510 Encounter for antineoplastic radiation therapy: Secondary | ICD-10-CM | POA: Diagnosis not present

## 2017-03-19 ENCOUNTER — Ambulatory Visit
Admission: RE | Admit: 2017-03-19 | Discharge: 2017-03-19 | Disposition: A | Discharge status: Home / Self Care | Payer: Medicare Other | Source: Ambulatory Visit | Attending: Radiation Oncology | Admitting: Radiation Oncology

## 2017-03-19 DIAGNOSIS — Z510 Encounter for antineoplastic radiation therapy: Secondary | ICD-10-CM | POA: Diagnosis not present

## 2017-03-24 ENCOUNTER — Ambulatory Visit
Admission: RE | Admit: 2017-03-24 | Discharge: 2017-03-24 | Disposition: A | Discharge status: Home / Self Care | Payer: Medicare Other | Source: Ambulatory Visit | Attending: Radiation Oncology | Admitting: Radiation Oncology

## 2017-03-24 DIAGNOSIS — Z510 Encounter for antineoplastic radiation therapy: Secondary | ICD-10-CM | POA: Diagnosis not present

## 2017-03-26 ENCOUNTER — Ambulatory Visit
Admission: RE | Admit: 2017-03-26 | Discharge: 2017-03-26 | Disposition: A | Discharge status: Home / Self Care | Payer: Medicare Other | Source: Ambulatory Visit | Attending: Radiation Oncology | Admitting: Radiation Oncology

## 2017-03-26 DIAGNOSIS — Z510 Encounter for antineoplastic radiation therapy: Secondary | ICD-10-CM | POA: Diagnosis not present

## 2017-03-27 ENCOUNTER — Inpatient Hospital Stay: Payer: Medicare Other

## 2017-03-27 ENCOUNTER — Inpatient Hospital Stay: Payer: Medicare Other | Admitting: Internal Medicine

## 2017-03-31 ENCOUNTER — Ambulatory Visit
Admission: RE | Admit: 2017-03-31 | Discharge: 2017-03-31 | Disposition: A | Discharge status: Home / Self Care | Payer: Medicare Other | Source: Ambulatory Visit | Attending: Radiation Oncology | Admitting: Radiation Oncology

## 2017-03-31 DIAGNOSIS — Z510 Encounter for antineoplastic radiation therapy: Secondary | ICD-10-CM | POA: Diagnosis not present

## 2017-04-09 ENCOUNTER — Inpatient Hospital Stay: Payer: Medicare Other

## 2017-04-09 ENCOUNTER — Inpatient Hospital Stay: Payer: Medicare Other | Attending: Internal Medicine | Admitting: Internal Medicine

## 2017-04-09 VITALS — BP 166/92 | HR 90 | Temp 97.6°F | Resp 22 | Ht 66.0 in | Wt 174.0 lb

## 2017-04-09 DIAGNOSIS — C3412 Malignant neoplasm of upper lobe, left bronchus or lung: Secondary | ICD-10-CM | POA: Insufficient documentation

## 2017-04-09 DIAGNOSIS — E1122 Type 2 diabetes mellitus with diabetic chronic kidney disease: Secondary | ICD-10-CM | POA: Insufficient documentation

## 2017-04-09 DIAGNOSIS — K639 Disease of intestine, unspecified: Secondary | ICD-10-CM | POA: Insufficient documentation

## 2017-04-09 DIAGNOSIS — R911 Solitary pulmonary nodule: Secondary | ICD-10-CM | POA: Insufficient documentation

## 2017-04-09 DIAGNOSIS — Z880 Allergy status to penicillin: Secondary | ICD-10-CM | POA: Diagnosis not present

## 2017-04-09 DIAGNOSIS — J449 Chronic obstructive pulmonary disease, unspecified: Secondary | ICD-10-CM | POA: Insufficient documentation

## 2017-04-09 DIAGNOSIS — Z794 Long term (current) use of insulin: Secondary | ICD-10-CM | POA: Diagnosis not present

## 2017-04-09 DIAGNOSIS — Z9221 Personal history of antineoplastic chemotherapy: Secondary | ICD-10-CM | POA: Diagnosis not present

## 2017-04-09 DIAGNOSIS — Z79899 Other long term (current) drug therapy: Secondary | ICD-10-CM | POA: Diagnosis not present

## 2017-04-09 DIAGNOSIS — Z85118 Personal history of other malignant neoplasm of bronchus and lung: Secondary | ICD-10-CM | POA: Diagnosis not present

## 2017-04-09 DIAGNOSIS — Z923 Personal history of irradiation: Secondary | ICD-10-CM | POA: Insufficient documentation

## 2017-04-09 DIAGNOSIS — N183 Chronic kidney disease, stage 3 (moderate): Secondary | ICD-10-CM | POA: Diagnosis not present

## 2017-04-09 DIAGNOSIS — Z9981 Dependence on supplemental oxygen: Secondary | ICD-10-CM | POA: Insufficient documentation

## 2017-04-09 DIAGNOSIS — Y842 Radiological procedure and radiotherapy as the cause of abnormal reaction of the patient, or of later complication, without mention of misadventure at the time of the procedure: Secondary | ICD-10-CM | POA: Insufficient documentation

## 2017-04-09 LAB — CBC WITH DIFFERENTIAL/PLATELET
BASOS ABS: 0.1 10*3/uL (ref 0–0.1)
BASOS PCT: 2 %
Eosinophils Absolute: 0.3 10*3/uL (ref 0–0.7)
Eosinophils Relative: 5 %
HEMATOCRIT: 36 % — AB (ref 40.0–52.0)
HEMOGLOBIN: 12.2 g/dL — AB (ref 13.0–18.0)
LYMPHS PCT: 18 %
Lymphs Abs: 1 10*3/uL (ref 1.0–3.6)
MCH: 28.5 pg (ref 26.0–34.0)
MCHC: 33.9 g/dL (ref 32.0–36.0)
MCV: 83.8 fL (ref 80.0–100.0)
MONOS PCT: 9 %
Monocytes Absolute: 0.5 10*3/uL (ref 0.2–1.0)
NEUTROS ABS: 3.9 10*3/uL (ref 1.4–6.5)
NEUTROS PCT: 66 %
Platelets: 211 10*3/uL (ref 150–440)
RBC: 4.29 MIL/uL — ABNORMAL LOW (ref 4.40–5.90)
RDW: 15.5 % — ABNORMAL HIGH (ref 11.5–14.5)
WBC: 5.8 10*3/uL (ref 3.8–10.6)

## 2017-04-09 LAB — BASIC METABOLIC PANEL
ANION GAP: 9 (ref 5–15)
BUN: 19 mg/dL (ref 6–20)
CHLORIDE: 97 mmol/L — AB (ref 101–111)
CO2: 30 mmol/L (ref 22–32)
Calcium: 9.6 mg/dL (ref 8.9–10.3)
Creatinine, Ser: 1.2 mg/dL (ref 0.61–1.24)
GFR calc non Af Amer: 58 mL/min — ABNORMAL LOW (ref >60)
Glucose, Bld: 180 mg/dL — ABNORMAL HIGH (ref 65–99)
Potassium: 3.8 mmol/L (ref 3.5–5.1)
Sodium: 136 mmol/L (ref 135–145)

## 2017-04-09 NOTE — Progress Notes (Signed)
[facility] OFFICE PROGRESS NOTE  Patient Care Team: [name], MD as PCP - General (Internal Medicine)  SUMMARY OF ONCOLOGIC HISTORY: Oncology History   Carcinoma of lung T1 N1 M0 tumor previously seen by radiation oncologist and radiation therapy approximately March of 2015.  2.Patient was evaluated because of abnormal CT scan  dated March 15 of 2016A she had a 6 mm right upper lobe pulmonary nodule which has likely an largest and I will up since last CT scan of 2014 Patient has previously treated left lung cancer with radiation changes 3.  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  Repeat CT scan revealed complete resolution of  Pancreatic  Mass. (April of 2017) # Difficulty intubation [eval by Dr.McQueen; no neck mass]  # AUG 2018- RUL nodule- ~15-65mm- slowly growing; s/p SBRT [finished sep 2018]  # Colon mass/polyp [No Bx sec to respi. 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  In the interim patient underwent SB RT of the right upper lobe lung nodule.   Patient denies any abdominal pain. Denies any constipation denies any blood in stools or black stools. Patient continues to be on 6 L of oxygen chronic for his long-standing COPD. Otherwise denies any admission to  the hospital for COPD exacerbation or acute respiratory failure. His ambulation is limited because of severe COPD. He complains that his portable oxygen- is not supplying him with enough oxygen.  Otherwise appetite is good. No headaches.  He goes around with a rolling walker.  ROS: A complete 10 point review of system is done  which is negative for mentioned above in history of present illness  I have reviewed the past medical history, past surgical history, social history and family history with the patient and they are unchanged from previous note.  ALLERGIES:  is allergic to ampicillin; penicillins; and zyban [bupropion].  MEDICATIONS:  Current Outpatient Prescriptions  Medication Sig Dispense Refill  . albuterol (PROVENTIL HFA;VENTOLIN HFA) 108 (90 Base) MCG/ACT inhaler Inhale 2 puffs into the lungs every 6 (six) hours as needed for wheezing or shortness of breath.    Marland Kitchen albuterol (PROVENTIL) (2.5 MG/3ML) 0.083% nebulizer solution Take 2.5 mg by nebulization every 4 (four) hours as needed for wheezing or shortness of breath.     Marland Kitchen buPROPion (WELLBUTRIN) 100 MG tablet Take 100 mg by mouth 2 (two) times daily.    . cetirizine (ZYRTEC) 10 MG tablet Take 10 mg by mouth daily.     . citalopram (CELEXA) 20 MG tablet Take 20 mg by mouth daily.     Marland Kitchen diltiazem (DILACOR XR) 180 MG 24 hr capsule Take 180 mg by mouth daily.    . enalapril (VASOTEC) 5 MG tablet TAKE 1 TABLET (5 MG TOTAL) BY MOUTH ONCE DAILY.  1  . fluvastatin XL (LESCOL XL) 80 MG 24 hr tablet Take 80 mg by mouth daily.    . furosemide (LASIX) 20 MG tablet Take 20 mg by mouth daily.     Marland Kitchen gabapentin (NEURONTIN) 300 MG capsule Take 300 mg by mouth daily.     Marland Kitchen  hydrochlorothiazide (HYDRODIURIL) 25 MG tablet TAKE 1 TABLET (25 MG TOTAL) BY MOUTH ONCE DAILY.  1  . hydrOXYzine (ATARAX/VISTARIL) 10 MG tablet Take 10 mg by mouth at bedtime.    . insulin aspart (NOVOLOG) 100 UNIT/ML injection Inject 2-28 Units into the skin 3 (three) times daily with meals as needed for high blood sugar. Pt uses as needed per sliding scale.    . insulin detemir (LEVEMIR) 100 UNIT/ML injection Inject 20-40 Units into the skin at bedtime. Pt uses 20 units in the morning    . LORazepam (ATIVAN) 0.5 MG tablet Take 0.5 mg by mouth at bedtime.     . magnesium oxide (MAG-OX) 400 MG tablet  Take 400 mg by mouth daily.     Marland Kitchen PARoxetine (PAXIL) 40 MG tablet Take 40 mg by mouth 2 (two) times daily.     . pravastatin (PRAVACHOL) 80 MG tablet Take 80 mg by mouth at bedtime.     . tamsulosin (FLOMAX) 0.4 MG CAPS capsule Take 0.4 mg by mouth at bedtime.    . theophylline (THEO-24) 300 MG 24 hr capsule Take 300 mg by mouth daily.    Marland Kitchen tiotropium (SPIRIVA) 18 MCG inhalation capsule Place 18 mcg into inhaler and inhale daily.     Marland Kitchen tiZANidine (ZANAFLEX) 4 MG tablet Take 4 mg by mouth at bedtime.    . traZODone (DESYREL) 100 MG tablet Take 100 mg by mouth at bedtime.    Marland Kitchen omeprazole (PRILOSEC) 20 MG capsule Take 20 mg by mouth 2 (two) times daily before a meal.    . pantoprazole (PROTONIX) 40 MG tablet Take 40 mg by mouth 2 (two) times daily before a meal.     . sildenafil (VIAGRA) 100 MG tablet Take 100 mg by mouth daily as needed for erectile dysfunction.      No current facility-administered medications for this visit.     PHYSICAL EXAMINATION: ECOG PERFORMANCE STATUS: 3 - Symptomatic, >50% confined to bed  Vitals:   04/09/17 1104 04/09/17 1111  BP: (!) 166/92   Pulse: 90   Resp: (!) 22   Temp: 97.6 F (36.4 C)   SpO2:  98%   Filed Weights   04/09/17 1104  Weight: 174 lb (78.9 kg)    GENERAL:alert, no distress and comfortable; . Home O2.He walks with a rolling walker. He is Accompanied by family. SKIN: skin color, texture, turgor are normal, no rashes or significant lesions EYES: normal, Conjunctiva are pink and non-injected, sclera clear OROPHARYNX:no exudate, no erythema and lips, buccal mucosa, and tongue normal  NECK: supple, thyroid normal size, non-tender, without nodularity LYMPH:  no palpable lymphadenopathy in the cervical, axillary or inguinal LUNGS: Decreased air entry bilaterally to auscultation; intermittent wheezing. No crackles HEART: regular rate & rhythm and no murmurs and no lower extremity edema ABDOMEN:abdomen soft, non-tender and normal bowel  sounds Musculoskeletal:no cyanosis of digits and no clubbing  NEURO: alert & oriented x 3 with fluent speech, no focal motor/sensory deficits  LABORATORY DATA:  I have reviewed the data as listed    Component Value Date/Time   NA 136 04/09/2017 1030   NA 139 05/19/2014 0343   K 3.8 04/09/2017 1030   K 4.4 05/19/2014 0343   CL 97 (L) 04/09/2017 1030   CL 105 05/19/2014 0343   CO2 30 04/09/2017 1030   CO2 30 05/19/2014 0343   GLUCOSE 180 (H) 04/09/2017 1030   GLUCOSE 134 (H) 05/19/2014 0343   BUN 19 04/09/2017 1030   BUN  29 (H) 05/19/2014 0343   CREATININE 1.20 04/09/2017 1030   CREATININE 1.18 05/19/2014 0343   CALCIUM 9.6 04/09/2017 1030   CALCIUM 8.3 (L) 05/19/2014 0343   PROT 7.6 08/15/2016 1054   PROT 7.3 05/16/2014 1941   ALBUMIN 3.8 08/15/2016 1054   ALBUMIN 3.9 05/16/2014 1941   AST 16 08/15/2016 1054   AST 15 05/16/2014 1941   ALT 7 (L) 08/15/2016 1054   ALT 21 05/16/2014 1941   ALKPHOS 91 08/15/2016 1054   ALKPHOS 111 05/16/2014 1941   BILITOT 0.6 08/15/2016 1054   BILITOT 0.7 05/16/2014 1941   GFRNONAA 58 (L) 04/09/2017 1030   GFRNONAA >60 05/19/2014 0343   GFRNONAA 60 (L) 08/29/2013 1559   GFRAA >60 04/09/2017 1030   GFRAA >60 05/19/2014 0343   GFRAA >60 08/29/2013 1559    No results found for: SPEP, UPEP  Lab Results  Component Value Date   WBC 5.8 04/09/2017   NEUTROABS 3.9 04/09/2017   HGB 12.2 (L) 04/09/2017   HCT 36.0 (L) 04/09/2017   MCV 83.8 04/09/2017   PLT 211 04/09/2017      Chemistry      Component Value Date/Time   NA 136 04/09/2017 1030   NA 139 05/19/2014 0343   K 3.8 04/09/2017 1030   K 4.4 05/19/2014 0343   CL 97 (L) 04/09/2017 1030   CL 105 05/19/2014 0343   CO2 30 04/09/2017 1030   CO2 30 05/19/2014 0343   BUN 19 04/09/2017 1030   BUN 29 (H) 05/19/2014 0343   CREATININE 1.20 04/09/2017 1030   CREATININE 1.18 05/19/2014 0343      Component Value Date/Time   CALCIUM 9.6 04/09/2017 1030   CALCIUM 8.3 (L) 05/19/2014  0343   ALKPHOS 91 08/15/2016 1054   ALKPHOS 111 05/16/2014 1941   AST 16 08/15/2016 1054   AST 15 05/16/2014 1941   ALT 7 (L) 08/15/2016 1054   ALT 21 05/16/2014 1941   BILITOT 0.6 08/15/2016 1054   BILITOT 0.7 05/16/2014 1941     IMPRESSION: Radiation changes in the left upper lobe/perihilar region.  Mild progression of the spiculated right upper lobe pulmonary nodule, now measuring 13 x 10 mm, highly suspicious for metachronous primary bronchogenic neoplasm or metastatic disease.   Electronically Signed   By: Julian Hy M.D.   On: 08/08/2016 08:54 ----------------------------------------------------------------- IMPRESSION: 1. Mildly reduced activity of the right upper lobe 1.3 cm pulmonary nodule, maximum SUV 1.6, previously 2.5. The current value may be mildly spuriously reduced due to motion artifact. Certainly the activity is not thought to be progressive for worsening. Low-grade malignancy could have this appearance. 2. No findings of left perihilar recurrence at this time. 3. Again observed is a focus of hypermetabolic activity along the posterior margin of the splenic flexure the colon, suspicious for an underlying polyp. 4. Other imaging findings of potential clinical significance: Aortic Atherosclerosis (ICD10-I70.0) and Emphysema (ICD10-J43.9). Coronary and carotid atherosclerosis. Prominent stool throughout the colon favors constipation. Prostatomegaly. Resolution of prior pancreatic activity.   Electronically Signed   By: Van Clines M.D.   On: 02/16/2017 12:13  RADIOGRAPHIC STUDIES: I have personally reviewed the radiological images as listed and agreed with the findings in the report. No results found.     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 Tolerated radiation fairly well. Await repeat a PET scan in  approximately 6 months.  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 Continue follow up with Dr.Fleming.   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  # descending colon uptake- ? Malignancy; had previous colo [2-3 years]. Poor candidate for colonoscopy; pt aware of the implications.   # follow up in 6 moths/labs PET prior.   Cc; Dr.Sparks.    Orders Placed This Encounter  Procedures  . NM PET Image Restag (PS) Skull Base To Thigh    Standing Status:   Future    Standing Expiration Date:   04/09/2018    Order Specific Question:   If indicated for the ordered procedure, I authorize the administration of a radiopharmaceutical per Radiology protocol    Answer:   Yes    Order Specific Question:   Preferred imaging location?    Answer:   [facility]    Order Specific Question:   Radiology Contrast Protocol - do NOT remove file path    Answer:   [email or web address]    Order Specific Question:   Reason for Exam additional comments    Answer:   Lung cancer;  . CBC with Differential    Standing Status:   Future    Standing Expiration Date:   04/09/2018  . Basic metabolic panel    Standing Status:   Future    Standing Expiration Date:   04/09/2018     Cammie Sickle, MD 04/10/2017 8:22 AM

## 2017-04-09 NOTE — Assessment & Plan Note (Addendum)
#   RUL- spiculated nodule ~ 67mm; AUG 2018- PET positive; s/p SBRT [finished sep 25th ]. Tolerated radiation fairly well. Await repeat a PET scan in approximately 6 months.  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 Continue follow up with Dr.Fleming.   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  # descending colon uptake- ? Malignancy; had previous colo [2-3 years]. Poor candidate for colonoscopy; pt aware of the implications.   # follow up in 6 moths/labs PET prior.   Cc; Dr.Sparks.

## 2017-05-06 ENCOUNTER — Ambulatory Visit
Admission: RE | Admit: 2017-05-06 | Discharge: 2017-05-06 | Disposition: A | Discharge status: Home / Self Care | Payer: Medicare Other | Source: Ambulatory Visit | Attending: Radiation Oncology | Admitting: Radiation Oncology

## 2017-05-06 ENCOUNTER — Encounter: Payer: Self-pay | Admitting: Radiation Oncology

## 2017-05-06 ENCOUNTER — Other Ambulatory Visit: Payer: Self-pay | Admitting: *Deleted

## 2017-05-06 VITALS — BP 145/87 | HR 120 | Temp 98.0°F | Resp 20 | Wt 169.9 lb

## 2017-05-06 DIAGNOSIS — Z923 Personal history of irradiation: Secondary | ICD-10-CM | POA: Insufficient documentation

## 2017-05-06 DIAGNOSIS — C3491 Malignant neoplasm of unspecified part of right bronchus or lung: Secondary | ICD-10-CM | POA: Insufficient documentation

## 2017-05-06 DIAGNOSIS — C3490 Malignant neoplasm of unspecified part of unspecified bronchus or lung: Secondary | ICD-10-CM

## 2017-05-06 DIAGNOSIS — C3412 Malignant neoplasm of upper lobe, left bronchus or lung: Secondary | ICD-10-CM

## 2017-05-06 NOTE — Progress Notes (Signed)
Radiation Oncology Follow up Note  Name: Earl Clay   Date:   05/06/2017 MRN:  [id number] DOB: 1941-04-17    This 76 y.o. male presents to the clinic today for one-month follow-up status post SB RT to his right lung for stage I non-small cell lung cancer.  REFERRING PROVIDER: [name], MD  HPI: Patient is a 76 year old male now out 1 month having completed SB RT to his right lung for a stage I non-small cell lung cancer. He is previously been treated to his left lung with SB RT over 3 years prior he is seen today in routine follow-up is doing fairly well. He is been on continual nasal oxygen which seems to be helping him considerably. He specifically denies cough hemoptysis chest tightness or dysphagia. He's really had no side effects from his SB RT.Marland Kitchen  COMPLICATIONS OF TREATMENT: none  FOLLOW UP COMPLIANCE: keeps appointments   PHYSICAL EXAM:  BP (!) 145/87   Pulse (!) 120   Temp 98 F (36.7 C)   Resp 20   Wt 169 lb 13.8 oz (77 kg)   BMI 27.42 kg/m  Well-developed wheelchair-bound male in nasal oxygen in NAD. Well-developed well-nourished patient in NAD. HEENT reveals PERLA, EOMI, discs not visualized.  Oral cavity is clear. No oral mucosal lesions are identified. Neck is clear without evidence of cervical or supraclavicular adenopathy. Lungs are clear to A&P. Cardiac examination is essentially unremarkable with regular rate and rhythm without murmur rub or thrill. Abdomen is benign with no organomegaly or masses noted. Motor sensory and DTR levels are equal and symmetric in the upper and lower extremities. Cranial nerves II through XII are grossly intact. Proprioception is intact. No peripheral adenopathy or edema is identified. No motor or sensory levels are noted. Crude visual fields are within normal range.  RADIOLOGY RESULTS: CT scan of the chest in 3 months has been ordered  PLAN: At the present time patient is stable. I've ordered a CT scan prior to his next  visit in 3 months of his chest with contrast. He overall is doing well. He continues on nasal oxygen. He also continues follow-up with medical oncology. Patient knows to call with any concerns.  I would like to take this opportunity to thank you for allowing me to participate in the care of your patient.Armstead Peaks., MD

## 2017-07-31 ENCOUNTER — Encounter: Payer: Self-pay | Admitting: *Deleted

## 2017-08-18 ENCOUNTER — Ambulatory Visit
Admission: RE | Admit: 2017-08-18 | Discharge: 2017-08-18 | Disposition: A | Discharge status: Home / Self Care | Payer: Medicare Other | Source: Ambulatory Visit | Attending: Radiation Oncology | Admitting: Radiation Oncology

## 2017-08-18 DIAGNOSIS — J432 Centrilobular emphysema: Secondary | ICD-10-CM | POA: Insufficient documentation

## 2017-08-18 DIAGNOSIS — C3412 Malignant neoplasm of upper lobe, left bronchus or lung: Secondary | ICD-10-CM | POA: Diagnosis present

## 2017-08-18 DIAGNOSIS — I700 Atherosclerosis of aorta: Secondary | ICD-10-CM | POA: Insufficient documentation

## 2017-08-18 DIAGNOSIS — I2510 Atherosclerotic heart disease of native coronary artery without angina pectoris: Secondary | ICD-10-CM | POA: Insufficient documentation

## 2017-08-18 LAB — POCT I-STAT CREATININE: Creatinine, Ser: 1.2 mg/dL (ref 0.61–1.24)

## 2017-08-18 MED ORDER — IOPAMIDOL (ISOVUE-300) INJECTION 61%
75.0000 mL | Freq: Once | INTRAVENOUS | Status: AC | PRN
  Administered 2017-08-18: 75 mL via INTRAVENOUS

## 2017-08-24 ENCOUNTER — Ambulatory Visit: Payer: Medicare Other | Admitting: Radiation Oncology

## 2017-09-07 ENCOUNTER — Ambulatory Visit: Payer: Medicare Other | Admitting: Radiation Oncology

## 2017-09-14 ENCOUNTER — Encounter: Payer: Self-pay | Admitting: Radiation Oncology

## 2017-09-14 ENCOUNTER — Ambulatory Visit
Admission: RE | Admit: 2017-09-14 | Discharge: 2017-09-14 | Disposition: A | Discharge status: Home / Self Care | Payer: Medicare Other | Source: Ambulatory Visit | Attending: Radiation Oncology | Admitting: Radiation Oncology

## 2017-09-14 ENCOUNTER — Other Ambulatory Visit: Payer: Self-pay | Admitting: *Deleted

## 2017-09-14 VITALS — BP 150/90 | HR 101 | Temp 98.0°F | Resp 20 | Wt 164.4 lb

## 2017-09-14 DIAGNOSIS — Z923 Personal history of irradiation: Secondary | ICD-10-CM | POA: Diagnosis not present

## 2017-09-14 DIAGNOSIS — C7801 Secondary malignant neoplasm of right lung: Secondary | ICD-10-CM | POA: Diagnosis not present

## 2017-09-14 DIAGNOSIS — C3412 Malignant neoplasm of upper lobe, left bronchus or lung: Secondary | ICD-10-CM

## 2017-09-14 DIAGNOSIS — Z87891 Personal history of nicotine dependence: Secondary | ICD-10-CM | POA: Diagnosis not present

## 2017-09-14 DIAGNOSIS — C3490 Malignant neoplasm of unspecified part of unspecified bronchus or lung: Secondary | ICD-10-CM

## 2017-09-14 NOTE — Progress Notes (Signed)
Radiation Oncology Follow up Note  Name: Earl Clay   Date:   09/14/2017 MRN:  [id number] DOB: August 03, 1940    This 77 y.o. male presents to t four-month follow-up status post SB RT to his right lung for stage I non-small cell lung cancer he clinic today for .  REFERRING PROVIDER: [name], MD  HPI: Patient is a 77 year old male now seen out 4 months having completed SB RT to his right lung for stage I non-small cell lung cancer. Seen today in routine follow-up he is doing well. He has multiple medical problems including on nasal oxygen wheelchair-bound. Specifically denies cough hemoptysis or chest tightness. Recently had a CT scan of his chest. Showing positive response to therapy with slight regression concurrently a 1 x 0.8 cm right upper lobe nodule.  COMPLICATIONS OF TREATMENT: none  FOLLOW UP COMPLIANCE: keeps appointments   PHYSICAL EXAM:  BP (!) 150/90   Pulse (!) 101   Temp 98 F (36.7 C)   Resp 20   Wt 164 lb 5.7 oz (74.6 kg)   BMI 26.53 kg/m  Frail-appearing male in nasal oxygen wheelchair-bound. Well-developed well-nourished patient in NAD. HEENT reveals PERLA, EOMI, discs not visualized.  Oral cavity is clear. No oral mucosal lesions are identified. Neck is clear without evidence of cervical or supraclavicular adenopathy. Lungs are clear to A&P. Cardiac examination is essentially unremarkable with regular rate and rhythm without murmur rub or thrill. Abdomen is benign with no organomegaly or masses noted. Motor sensory and DTR levels are equal and symmetric in the upper and lower extremities. Cranial nerves II through XII are grossly intact. Proprioception is intact. No peripheral adenopathy or edema is identified. No motor or sensory levels are noted. Crude visual fields are within normal range.  RADIOLOGY RESULTS: CT scan is reviewed and compatible with the above-stated findings  PLAN: Present time patient is doing well with good response by CT  criteria. I'm please was overall progress. I've asked to see him back in 6 months for follow-up and will obtain a CT scan of his chest with contrast prior to that visit. Will start once your visits should that be stable. Patient family know to call sooner with any concerns. He continues to do well.  I would like to take this opportunity to thank you for allowing me to participate in the care of your patient.Noreene Filbert, MD

## 2017-10-08 ENCOUNTER — Other Ambulatory Visit: Payer: Self-pay

## 2017-10-08 ENCOUNTER — Inpatient Hospital Stay (HOSPITAL_BASED_OUTPATIENT_CLINIC_OR_DEPARTMENT_OTHER): Payer: Medicare Other | Admitting: Internal Medicine

## 2017-10-08 ENCOUNTER — Inpatient Hospital Stay: Payer: Medicare Other | Attending: Internal Medicine

## 2017-10-08 ENCOUNTER — Encounter: Payer: Self-pay | Admitting: Internal Medicine

## 2017-10-08 VITALS — BP 130/75 | HR 103 | Temp 97.9°F | Resp 22 | Ht 66.0 in | Wt 165.0 lb

## 2017-10-08 DIAGNOSIS — K639 Disease of intestine, unspecified: Secondary | ICD-10-CM | POA: Diagnosis not present

## 2017-10-08 DIAGNOSIS — J449 Chronic obstructive pulmonary disease, unspecified: Secondary | ICD-10-CM | POA: Insufficient documentation

## 2017-10-08 DIAGNOSIS — Z9981 Dependence on supplemental oxygen: Secondary | ICD-10-CM

## 2017-10-08 DIAGNOSIS — I700 Atherosclerosis of aorta: Secondary | ICD-10-CM

## 2017-10-08 DIAGNOSIS — N183 Chronic kidney disease, stage 3 (moderate): Secondary | ICD-10-CM | POA: Insufficient documentation

## 2017-10-08 DIAGNOSIS — C3412 Malignant neoplasm of upper lobe, left bronchus or lung: Secondary | ICD-10-CM

## 2017-10-08 LAB — BASIC METABOLIC PANEL
ANION GAP: 10 (ref 5–15)
BUN: 29 mg/dL — ABNORMAL HIGH (ref 6–20)
CALCIUM: 9.8 mg/dL (ref 8.9–10.3)
CO2: 25 mmol/L (ref 22–32)
CREATININE: 1.53 mg/dL — AB (ref 0.61–1.24)
Chloride: 103 mmol/L (ref 101–111)
GFR, EST AFRICAN AMERICAN: 50 mL/min — AB (ref >60)
GFR, EST NON AFRICAN AMERICAN: 43 mL/min — AB (ref >60)
Glucose, Bld: 69 mg/dL (ref 65–99)
Potassium: 3.6 mmol/L (ref 3.5–5.1)
SODIUM: 138 mmol/L (ref 135–145)

## 2017-10-08 LAB — CBC WITH DIFFERENTIAL/PLATELET
BASOS ABS: 0.1 10*3/uL (ref 0–0.1)
BASOS PCT: 2 %
EOS ABS: 0.2 10*3/uL (ref 0–0.7)
Eosinophils Relative: 4 %
HCT: 37.8 % — ABNORMAL LOW (ref 40.0–52.0)
HEMOGLOBIN: 12.5 g/dL — AB (ref 13.0–18.0)
Lymphocytes Relative: 20 %
Lymphs Abs: 1.2 10*3/uL (ref 1.0–3.6)
MCH: 27.6 pg (ref 26.0–34.0)
MCHC: 33.1 g/dL (ref 32.0–36.0)
MCV: 83.3 fL (ref 80.0–100.0)
MONOS PCT: 7 %
Monocytes Absolute: 0.4 10*3/uL (ref 0.2–1.0)
NEUTROS ABS: 4.3 10*3/uL (ref 1.4–6.5)
Neutrophils Relative %: 67 %
Platelets: 174 10*3/uL (ref 150–440)
RBC: 4.53 MIL/uL (ref 4.40–5.90)
RDW: 16.2 % — ABNORMAL HIGH (ref 11.5–14.5)
WBC: 6.3 10*3/uL (ref 3.8–10.6)

## 2017-10-08 NOTE — Assessment & Plan Note (Addendum)
#   RUL- spiculated nodule ~ 57mm; AUG 2018- PET positive; s/p SBRT [finished sep 25th 2018 ]. FEB 2019- CT- Improved response; currentlt nodule measuring ~30mm. Await repeat imaging in approximately 6 months.  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 Continue follow up with Dr.Fleming.   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  # descending colon uptake- ? Malignancy; had previous colo [2-3 years]. Poor candidate for colonoscopy; pt aware of the implications.   # follow up in 6 moths/labs CT/ Dr.Crystal's appt.    # I reviewed the blood work- with the patient in detail; also reviewed the imaging independently [as summarized above]; and with the patient in detail.    Cc; Dr.Sparks.

## 2017-10-08 NOTE — Progress Notes (Signed)
[facility] OFFICE PROGRESS NOTE  Patient Care Team: [name], MD as PCP - General (Internal Medicine)  SUMMARY OF ONCOLOGIC HISTORY: Oncology History   Carcinoma of lung T1 N1 M0 tumor previously seen by radiation oncologist and radiation therapy approximately March of 2015.  2.Patient was evaluated because of abnormal CT scan  dated March 15 of 2016A she had a 6 mm right upper lobe pulmonary nodule which has likely an largest and I will up since last CT scan of 2014 Patient has previously treated left lung cancer with radiation changes 3.  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  Repeat CT scan revealed complete resolution of  Pancreatic  Mass. (April of 2017) # Difficulty intubation [eval by Dr.McQueen; no neck mass]  # AUG 2018- RUL nodule- ~15-75mm- slowly growing; s/p SBRT [finished sep 2018]  # Colon mass/polyp [No Bx sec to respi. 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  In the interim patient underwent SB RT of the right upper lobe lung nodule.   Patient denies any abdominal pain. Denies any constipation denies any blood in stools or black stools. Patient continues to be on 6 L of oxygen chronic for his long-standing COPD. Otherwise denies any admission to  the hospital for COPD exacerbation or acute respiratory failure. His ambulation is limited because of severe COPD. He complains that his portable oxygen- is not supplying him with enough oxygen.  Otherwise appetite is good. No headaches.  He goes around with a rolling walker.  ROS: A complete 10 point review of system is done which is negative for mentioned above in history of present illness  I have reviewed the  past medical history, past surgical history, social history and family history with the patient and they are unchanged from previous note.  ALLERGIES:  is allergic to ampicillin; penicillins; and zyban [bupropion].  MEDICATIONS:  Current Outpatient Medications  Medication Sig Dispense Refill  . albuterol (PROVENTIL HFA;VENTOLIN HFA) 108 (90 Base) MCG/ACT inhaler Inhale 2 puffs into the lungs every 6 (six) hours as needed for wheezing or shortness of breath.    Marland Kitchen albuterol (PROVENTIL) (2.5 MG/3ML) 0.083% nebulizer solution Take 2.5 mg by nebulization every 4 (four) hours as needed for wheezing or shortness of breath.     Marland Kitchen buPROPion (WELLBUTRIN) 100 MG tablet Take 100 mg by mouth 2 (two) times daily.    . cetirizine (ZYRTEC) 10 MG tablet Take 10 mg by mouth daily.     . citalopram (CELEXA) 20 MG tablet Take 20 mg by mouth daily.     Marland Kitchen diltiazem (DILACOR XR) 180 MG 24 hr capsule Take 180 mg by mouth daily.    . enalapril (VASOTEC) 5 MG tablet TAKE 1 TABLET (5 MG TOTAL) BY MOUTH ONCE DAILY.  1  . fluvastatin XL (LESCOL XL) 80 MG 24 hr tablet Take 80 mg by mouth daily.    . furosemide (LASIX) 20 MG tablet Take 20 mg by mouth daily.     Marland Kitchen gabapentin (NEURONTIN) 300 MG capsule Take 300 mg by mouth daily.     . hydrochlorothiazide (HYDRODIURIL) 25 MG tablet TAKE 1 TABLET (25 MG TOTAL) BY MOUTH ONCE DAILY.  1  . hydrOXYzine (ATARAX/VISTARIL) 10 MG tablet Take 10 mg by mouth at bedtime.    . insulin aspart (NOVOLOG) 100 UNIT/ML injection Inject 2-28 Units into the skin 3 (three) times daily with meals as needed for high blood sugar. Pt uses as needed per sliding scale.    . insulin detemir (LEVEMIR) 100 UNIT/ML injection Inject 20-40 Units into the skin at bedtime. Pt uses 20 units in the morning    . LORazepam (ATIVAN) 0.5 MG tablet Take 0.5 mg by mouth at bedtime.     . magnesium oxide (MAG-OX) 400 MG tablet Take 400 mg by mouth daily.     . pravastatin (PRAVACHOL) 80 MG tablet Take 80 mg by mouth at  bedtime.     . tamsulosin (FLOMAX) 0.4 MG CAPS capsule Take 0.4 mg by mouth at bedtime.    . theophylline (THEO-24) 300 MG 24 hr capsule Take 300 mg by mouth daily.    Marland Kitchen tiotropium (SPIRIVA) 18 MCG inhalation capsule Place 18 mcg into inhaler and inhale daily.     Marland Kitchen tiZANidine (ZANAFLEX) 4 MG tablet Take 4 mg by mouth at bedtime.    . traZODone (DESYREL) 100 MG tablet Take 100 mg by mouth at bedtime.    Marland Kitchen PARoxetine (PAXIL) 40 MG tablet Take 40 mg by mouth 2 (two) times daily.     . sildenafil (VIAGRA) 100 MG tablet Take 100 mg by mouth daily as needed for erectile dysfunction.      No current facility-administered medications for this visit.     PHYSICAL EXAMINATION: ECOG PERFORMANCE STATUS: 3 - Symptomatic, >50% confined to bed  Vitals:   10/08/17 1130  BP: 130/75  Pulse: (!) 103  Resp: (!) 22  Temp: 97.9 F (36.6 C)  SpO2: 95%   Filed Weights   10/08/17 1139  Weight: 165 lb (74.8 kg)    GENERAL:alert, no distress and comfortable; . Home O2.He walks with a rolling walker. He is Accompanied by family. SKIN: skin color, texture, turgor are normal, no rashes or significant lesions EYES: normal, Conjunctiva are pink and non-injected, sclera clear OROPHARYNX:no exudate, no erythema and lips, buccal mucosa, and tongue normal  NECK: supple, thyroid normal size, non-tender, without nodularity LYMPH:  no palpable lymphadenopathy in the cervical, axillary or inguinal LUNGS: Decreased air entry bilaterally to auscultation; intermittent wheezing. No crackles HEART: regular rate & rhythm and no murmurs and no lower extremity edema ABDOMEN:abdomen soft, non-tender and normal bowel sounds Musculoskeletal:no cyanosis of digits and no clubbing  NEURO: alert & oriented x 3 with fluent speech, no focal motor/sensory deficits  LABORATORY DATA:  I have reviewed the data as listed    Component Value Date/Time   NA 138 10/08/2017 1124   NA 139 05/19/2014 0343   K 3.6 10/08/2017 1124   K  4.4 05/19/2014 0343   CL 103 10/08/2017 1124   CL 105 05/19/2014 0343   CO2 25 10/08/2017 1124   CO2 30 05/19/2014 0343   GLUCOSE 69 10/08/2017 1124   GLUCOSE 134 (H) 05/19/2014 0343   BUN 29 (H) 10/08/2017 1124   BUN 29 (H) 05/19/2014 0343   CREATININE 1.53 (H) 10/08/2017 1124   CREATININE 1.18 05/19/2014 0343   CALCIUM 9.8 10/08/2017 1124   CALCIUM 8.3 (L) 05/19/2014 0343   PROT 7.6 08/15/2016 1054   PROT 7.3 05/16/2014 1941   ALBUMIN 3.8 08/15/2016 1054   ALBUMIN 3.9 05/16/2014 1941   AST 16 08/15/2016 1054   AST 15 05/16/2014 1941  ALT 7 (L) 08/15/2016 1054   ALT 21 05/16/2014 1941   ALKPHOS 91 08/15/2016 1054   ALKPHOS 111 05/16/2014 1941   BILITOT 0.6 08/15/2016 1054   BILITOT 0.7 05/16/2014 1941   GFRNONAA 43 (L) 10/08/2017 1124   GFRNONAA >60 05/19/2014 0343   GFRNONAA 60 (L) 08/29/2013 1559   GFRAA 50 (L) 10/08/2017 1124   GFRAA >60 05/19/2014 0343   GFRAA >60 08/29/2013 1559    No results found for: SPEP, UPEP  Lab Results  Component Value Date   WBC 6.3 10/08/2017   NEUTROABS 4.3 10/08/2017   HGB 12.5 (L) 10/08/2017   HCT 37.8 (L) 10/08/2017   MCV 83.3 10/08/2017   PLT 174 10/08/2017      Chemistry      Component Value Date/Time   NA 138 10/08/2017 1124   NA 139 05/19/2014 0343   K 3.6 10/08/2017 1124   K 4.4 05/19/2014 0343   CL 103 10/08/2017 1124   CL 105 05/19/2014 0343   CO2 25 10/08/2017 1124   CO2 30 05/19/2014 0343   BUN 29 (H) 10/08/2017 1124   BUN 29 (H) 05/19/2014 0343   CREATININE 1.53 (H) 10/08/2017 1124   CREATININE 1.18 05/19/2014 0343      Component Value Date/Time   CALCIUM 9.8 10/08/2017 1124   CALCIUM 8.3 (L) 05/19/2014 0343   ALKPHOS 91 08/15/2016 1054   ALKPHOS 111 05/16/2014 1941   AST 16 08/15/2016 1054   AST 15 05/16/2014 1941   ALT 7 (L) 08/15/2016 1054   ALT 21 05/16/2014 1941   BILITOT 0.6 08/15/2016 1054   BILITOT 0.7 05/16/2014 1941     ----------------------------------------------------------------- IMPRESSION: 1. Today's study demonstrates a positive response to therapy with slight regression of what is currently a 10 x 8 mm right upper lobe nodule. 2. Mild diffuse bronchial wall thickening with moderate centrilobular and paraseptal emphysema; imaging findings suggestive of underlying COPD. 3. Aortic atherosclerosis, in addition to three-vessel coronary artery disease. Assessment for potential risk factor modification, dietary therapy or pharmacologic therapy may be warranted, if clinically indicated. 4. Additional incidental findings, as above.  Aortic Atherosclerosis (ICD10-I70.0) and Emphysema (ICD10-J43.9).   Electronically Signed   By: Vinnie Langton M.D.   On: 08/18/2017 13:33 RADIOGRAPHIC STUDIES: I have personally reviewed the radiological images as listed and agreed with the findings in the report. No results found.     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 FEB 2019- CT- Improved response; currentlt nodule measuring ~65mm. Await repeat imaging in approximately 6 months.  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 Continue follow up with Dr.Fleming.   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  # descending colon uptake- ? Malignancy; had previous colo [2-3 years]. Poor candidate for colonoscopy; pt aware of the implications.   # follow up in 6 moths/labs CT/ Dr.Crystal's appt.    # I reviewed the blood work- with the patient in detail; also reviewed the imaging independently [as summarized above]; and with the patient in detail.    Cc; Dr.Sparks.    No orders of the defined types were placed in this encounter.     Cammie Sickle, MD 10/17/2017 6:39 PM

## 2018-03-29 ENCOUNTER — Ambulatory Visit
Admission: RE | Admit: 2018-03-29 | Discharge: 2018-03-29 | Disposition: A | Discharge status: Home / Self Care | Payer: Medicare Other | Source: Ambulatory Visit | Attending: Radiation Oncology | Admitting: Radiation Oncology

## 2018-03-29 DIAGNOSIS — J439 Emphysema, unspecified: Secondary | ICD-10-CM | POA: Diagnosis not present

## 2018-03-29 DIAGNOSIS — D3502 Benign neoplasm of left adrenal gland: Secondary | ICD-10-CM | POA: Diagnosis not present

## 2018-03-29 DIAGNOSIS — I2510 Atherosclerotic heart disease of native coronary artery without angina pectoris: Secondary | ICD-10-CM | POA: Diagnosis not present

## 2018-03-29 DIAGNOSIS — D3501 Benign neoplasm of right adrenal gland: Secondary | ICD-10-CM | POA: Insufficient documentation

## 2018-03-29 DIAGNOSIS — C3412 Malignant neoplasm of upper lobe, left bronchus or lung: Secondary | ICD-10-CM | POA: Diagnosis present

## 2018-03-29 DIAGNOSIS — I700 Atherosclerosis of aorta: Secondary | ICD-10-CM | POA: Diagnosis not present

## 2018-03-29 LAB — POCT I-STAT CREATININE: CREATININE: 1.3 mg/dL — AB (ref 0.61–1.24)

## 2018-03-29 MED ORDER — IOHEXOL 300 MG/ML  SOLN
75.0000 mL | Freq: Once | INTRAMUSCULAR | Status: AC | PRN
  Administered 2018-03-29: 75 mL via INTRAVENOUS

## 2018-04-05 ENCOUNTER — Encounter: Payer: Self-pay | Admitting: Internal Medicine

## 2018-04-05 ENCOUNTER — Inpatient Hospital Stay: Payer: Medicare Other | Attending: Internal Medicine

## 2018-04-05 ENCOUNTER — Inpatient Hospital Stay (HOSPITAL_BASED_OUTPATIENT_CLINIC_OR_DEPARTMENT_OTHER): Payer: Medicare Other | Admitting: Internal Medicine

## 2018-04-05 ENCOUNTER — Other Ambulatory Visit: Payer: Self-pay

## 2018-04-05 ENCOUNTER — Ambulatory Visit
Admission: RE | Admit: 2018-04-05 | Discharge: 2018-04-05 | Disposition: A | Discharge status: Home / Self Care | Payer: Medicare Other | Source: Ambulatory Visit | Attending: Radiation Oncology | Admitting: Radiation Oncology

## 2018-04-05 ENCOUNTER — Other Ambulatory Visit: Payer: Self-pay | Admitting: *Deleted

## 2018-04-05 VITALS — BP 151/93 | HR 103 | Temp 96.8°F | Resp 24 | Ht 66.0 in | Wt 167.0 lb

## 2018-04-05 DIAGNOSIS — Z923 Personal history of irradiation: Secondary | ICD-10-CM | POA: Diagnosis not present

## 2018-04-05 DIAGNOSIS — J439 Emphysema, unspecified: Secondary | ICD-10-CM | POA: Insufficient documentation

## 2018-04-05 DIAGNOSIS — Z87891 Personal history of nicotine dependence: Secondary | ICD-10-CM | POA: Insufficient documentation

## 2018-04-05 DIAGNOSIS — J449 Chronic obstructive pulmonary disease, unspecified: Secondary | ICD-10-CM | POA: Diagnosis not present

## 2018-04-05 DIAGNOSIS — N183 Chronic kidney disease, stage 3 (moderate): Secondary | ICD-10-CM | POA: Insufficient documentation

## 2018-04-05 DIAGNOSIS — C3412 Malignant neoplasm of upper lobe, left bronchus or lung: Secondary | ICD-10-CM | POA: Insufficient documentation

## 2018-04-05 DIAGNOSIS — Z9221 Personal history of antineoplastic chemotherapy: Secondary | ICD-10-CM | POA: Insufficient documentation

## 2018-04-05 DIAGNOSIS — I2510 Atherosclerotic heart disease of native coronary artery without angina pectoris: Secondary | ICD-10-CM | POA: Diagnosis not present

## 2018-04-05 LAB — CBC WITH DIFFERENTIAL/PLATELET
Basophils Absolute: 0.1 10*3/uL (ref 0–0.1)
Basophils Relative: 1 %
EOS ABS: 0.3 10*3/uL (ref 0–0.7)
Eosinophils Relative: 4 %
HCT: 39.3 % — ABNORMAL LOW (ref 40.0–52.0)
HEMOGLOBIN: 13.1 g/dL (ref 13.0–18.0)
LYMPHS ABS: 1.4 10*3/uL (ref 1.0–3.6)
LYMPHS PCT: 18 %
MCH: 28.4 pg (ref 26.0–34.0)
MCHC: 33.3 g/dL (ref 32.0–36.0)
MCV: 85.4 fL (ref 80.0–100.0)
Monocytes Absolute: 0.5 10*3/uL (ref 0.2–1.0)
Monocytes Relative: 7 %
NEUTROS PCT: 70 %
Neutro Abs: 5.6 10*3/uL (ref 1.4–6.5)
PLATELETS: 177 10*3/uL (ref 150–440)
RBC: 4.59 MIL/uL (ref 4.40–5.90)
RDW: 14.1 % (ref 11.5–14.5)
WBC: 8 10*3/uL (ref 3.8–10.6)

## 2018-04-05 LAB — COMPREHENSIVE METABOLIC PANEL
ALBUMIN: 3.7 g/dL (ref 3.5–5.0)
ALK PHOS: 78 U/L (ref 38–126)
ALT: 9 U/L (ref 0–44)
ANION GAP: 10 (ref 5–15)
AST: 14 U/L — ABNORMAL LOW (ref 15–41)
BUN: 24 mg/dL — ABNORMAL HIGH (ref 8–23)
CO2: 30 mmol/L (ref 22–32)
Calcium: 9.6 mg/dL (ref 8.9–10.3)
Chloride: 100 mmol/L (ref 98–111)
Creatinine, Ser: 1.24 mg/dL (ref 0.61–1.24)
GFR calc non Af Amer: 56 mL/min — ABNORMAL LOW (ref >60)
GLUCOSE: 160 mg/dL — AB (ref 70–99)
POTASSIUM: 4 mmol/L (ref 3.5–5.1)
SODIUM: 140 mmol/L (ref 135–145)
Total Bilirubin: 0.7 mg/dL (ref 0.3–1.2)
Total Protein: 7 g/dL (ref 6.5–8.1)

## 2018-04-05 NOTE — Assessment & Plan Note (Addendum)
#   RUL- spiculated nodule ~ 69mm; AUG 2018- PET positive; s/p SBRT [finished sep 25th 2018 ].  CT scan shows stable right upper lobe lung nodule not any worse.   #LUL cancer status post chemoradiation-stable chronic changes in the left hilar region.  Likely scarring.  Stable.  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 [Dr.Fleming].   # CKD-III- creatinine 1.3/ STABLE [Dr.Lateef]   # descending colon uptake- ? Malignancy; had previous colo [2-3 years].  Poor candidate for colonoscopy.  Stable.  # follow up in 6 months/labs- CT chest prior-   # I reviewed the blood work- with the patient in detail; also reviewed the imaging independently [as summarized above]; and with the patient in detail.   Cc; Dr.Sparks.

## 2018-04-05 NOTE — Progress Notes (Signed)
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  2.Patient was evaluated because of abnormal CT scan  dated March 15 of 2016A she had a 6 mm right upper lobe pulmonary nodule which has likely an largest and I will up since last CT scan of 2014 Patient has previously treated left lung cancer with radiation changes 3.  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  Repeat CT scan revealed complete resolution of  Pancreatic  Mass. (April of 2017) # Difficulty intubation [eval by Dr.McQueen; no neck mass]  # AUG 2018- RUL nodule- ~15-32mm- slowly growing; s/p SBRT [finished sep 2018]  # Colon mass/polyp [No Bx sec to respi. 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  Patient continues to have difficulty breathing cough especially the mornings.  This is chronic.  He is on 4 to 6 L of oxygen at baseline.  Otherwise denies any nausea vomiting abdominal pain constipation.  Review of Systems  Constitutional: Positive for malaise/fatigue. Negative for chills, diaphoresis, fever and weight loss.  HENT: Negative for nosebleeds and sore throat.   Eyes: Negative for double vision.  Respiratory: Positive for cough, sputum production, shortness of breath and wheezing.   Cardiovascular: Negative for  chest pain, palpitations, orthopnea and leg swelling.  Gastrointestinal: Negative for abdominal pain, blood in stool, constipation, diarrhea, heartburn, melena, nausea and vomiting.  Genitourinary: Negative for dysuria, frequency and urgency.  Musculoskeletal: Negative for back pain and joint pain.  Skin: Negative.  Negative for itching and rash.  Neurological: Negative for dizziness, tingling, focal weakness, weakness and headaches.  Endo/Heme/Allergies: Does not bruise/bleed easily.  Psychiatric/Behavioral: Negative for depression. The patient is not nervous/anxious and does not have insomnia.      I have reviewed the past medical history, past surgical history, social history and family history with the patient and they are unchanged from previous note.  ALLERGIES:  is allergic to ampicillin; penicillins; and zyban [bupropion].  MEDICATIONS:  Current Outpatient Medications  Medication Sig Dispense Refill  . buPROPion (WELLBUTRIN) 100 MG tablet Take 100 mg by mouth 2 (two) times daily.    . cetirizine (ZYRTEC) 10 MG tablet Take 10 mg by mouth daily.     . citalopram (CELEXA) 20 MG tablet Take 20 mg by mouth daily.     Marland Kitchen diltiazem (DILACOR XR) 180 MG 24 hr capsule Take 180 mg by mouth daily.    . enalapril (VASOTEC) 5 MG tablet TAKE 1 TABLET (5 MG TOTAL) BY MOUTH ONCE DAILY.  1  . fluvastatin XL (LESCOL XL) 80 MG 24 hr tablet Take 80 mg by mouth daily.    . furosemide (LASIX) 20 MG tablet Take 20 mg by mouth daily.     Marland Kitchen gabapentin (NEURONTIN) 300 MG capsule Take 300 mg by mouth daily.     Marland Kitchen  hydrochlorothiazide (HYDRODIURIL) 25 MG tablet TAKE 1 TABLET (25 MG TOTAL) BY MOUTH ONCE DAILY.  1  . hydrOXYzine (ATARAX/VISTARIL) 10 MG tablet Take 10 mg by mouth at bedtime.    . insulin aspart (NOVOLOG) 100 UNIT/ML injection Inject 2-28 Units into the skin 3 (three) times daily with meals as needed for high blood sugar. Pt uses as needed per sliding scale.    . insulin detemir (LEVEMIR) 100  UNIT/ML injection Inject 20-40 Units into the skin at bedtime. Pt uses 20 units in the morning    . LORazepam (ATIVAN) 0.5 MG tablet Take 0.5 mg by mouth at bedtime.     . magnesium oxide (MAG-OX) 400 MG tablet Take 400 mg by mouth daily.     Marland Kitchen PARoxetine (PAXIL) 40 MG tablet Take 40 mg by mouth 2 (two) times daily.     . pravastatin (PRAVACHOL) 80 MG tablet Take 80 mg by mouth at bedtime.     . tamsulosin (FLOMAX) 0.4 MG CAPS capsule Take 0.4 mg by mouth at bedtime.    . theophylline (THEO-24) 300 MG 24 hr capsule Take 300 mg by mouth daily.    Marland Kitchen tiotropium (SPIRIVA) 18 MCG inhalation capsule Place 18 mcg into inhaler and inhale daily.     Marland Kitchen tiZANidine (ZANAFLEX) 4 MG tablet Take 4 mg by mouth at bedtime.    . traZODone (DESYREL) 100 MG tablet Take 100 mg by mouth at bedtime.    Marland Kitchen albuterol (PROVENTIL HFA;VENTOLIN HFA) 108 (90 Base) MCG/ACT inhaler Inhale 2 puffs into the lungs every 6 (six) hours as needed for wheezing or shortness of breath.    Marland Kitchen albuterol (PROVENTIL) (2.5 MG/3ML) 0.083% nebulizer solution Take 2.5 mg by nebulization every 4 (four) hours as needed for wheezing or shortness of breath.     . sildenafil (VIAGRA) 100 MG tablet Take 100 mg by mouth daily as needed for erectile dysfunction.      No current facility-administered medications for this visit.     PHYSICAL EXAMINATION: ECOG PERFORMANCE STATUS: 3 - Symptomatic, >50% confined to bed  Vitals:   04/05/18 1035  BP: (!) 151/93  Pulse: (!) 103  Resp: (!) 24  Temp: (!) 96.8 F (36 C)  SpO2: 91%   Filed Weights   04/05/18 1035  Weight: 167 lb (75.8 kg)    Physical Exam  Constitutional: He is oriented to person, place, and time and well-developed, well-nourished, and in no distress.  Cachectic appearing Caucasian male patient.  He is on oxygen.  He is in a wheelchair.  Accompanied by family.  HENT:  Head: Normocephalic and atraumatic.  Mouth/Throat: Oropharynx is clear and moist. No oropharyngeal exudate.   Eyes: Pupils are equal, round, and reactive to light.  Neck: Normal range of motion. Neck supple.  Cardiovascular: Normal rate and regular rhythm.  Pulmonary/Chest: No respiratory distress. He has no wheezes.  Decreased air entry bilaterally.  Abdominal: Soft. Bowel sounds are normal. He exhibits no distension and no mass. There is no tenderness. There is no rebound and no guarding.  Musculoskeletal: Normal range of motion. He exhibits no edema or tenderness.  Neurological: He is alert and oriented to person, place, and time.  Skin: Skin is warm.  Psychiatric: Affect normal.    LABORATORY DATA:  I have reviewed the data as listed    Component Value Date/Time   NA 140 04/05/2018 1008   NA 139 05/19/2014 0343   K 4.0 04/05/2018 1008   K 4.4 05/19/2014 0343  CL 100 04/05/2018 1008   CL 105 05/19/2014 0343   CO2 30 04/05/2018 1008   CO2 30 05/19/2014 0343   GLUCOSE 160 (H) 04/05/2018 1008   GLUCOSE 134 (H) 05/19/2014 0343   BUN 24 (H) 04/05/2018 1008   BUN 29 (H) 05/19/2014 0343   CREATININE 1.24 04/05/2018 1008   CREATININE 1.18 05/19/2014 0343   CALCIUM 9.6 04/05/2018 1008   CALCIUM 8.3 (L) 05/19/2014 0343   PROT 7.0 04/05/2018 1008   PROT 7.3 05/16/2014 1941   ALBUMIN 3.7 04/05/2018 1008   ALBUMIN 3.9 05/16/2014 1941   AST 14 (L) 04/05/2018 1008   AST 15 05/16/2014 1941   ALT 9 04/05/2018 1008   ALT 21 05/16/2014 1941   ALKPHOS 78 04/05/2018 1008   ALKPHOS 111 05/16/2014 1941   BILITOT 0.7 04/05/2018 1008   BILITOT 0.7 05/16/2014 1941   GFRNONAA 56 (L) 04/05/2018 1008   GFRNONAA >60 05/19/2014 0343   GFRNONAA 60 (L) 08/29/2013 1559   GFRAA >60 04/05/2018 1008   GFRAA >60 05/19/2014 0343   GFRAA >60 08/29/2013 1559    No results found for: SPEP, UPEP  Lab Results  Component Value Date   WBC 8.0 04/05/2018   NEUTROABS 5.6 04/05/2018   HGB 13.1 04/05/2018   HCT 39.3 (L) 04/05/2018   MCV 85.4 04/05/2018   PLT 177 04/05/2018      Chemistry      Component  Value Date/Time   NA 140 04/05/2018 1008   NA 139 05/19/2014 0343   K 4.0 04/05/2018 1008   K 4.4 05/19/2014 0343   CL 100 04/05/2018 1008   CL 105 05/19/2014 0343   CO2 30 04/05/2018 1008   CO2 30 05/19/2014 0343   BUN 24 (H) 04/05/2018 1008   BUN 29 (H) 05/19/2014 0343   CREATININE 1.24 04/05/2018 1008   CREATININE 1.18 05/19/2014 0343      Component Value Date/Time   CALCIUM 9.6 04/05/2018 1008   CALCIUM 8.3 (L) 05/19/2014 0343   ALKPHOS 78 04/05/2018 1008   ALKPHOS 111 05/16/2014 1941   AST 14 (L) 04/05/2018 1008   AST 15 05/16/2014 1941   ALT 9 04/05/2018 1008   ALT 21 05/16/2014 1941   BILITOT 0.7 04/05/2018 1008   BILITOT 0.7 05/16/2014 1941    ----------------------------------------------------------------- IMPRESSION: 1. Today's study demonstrates a positive response to therapy with slight regression of what is currently a 10 x 8 mm right upper lobe nodule. 2. Mild diffuse bronchial wall thickening with moderate centrilobular and paraseptal emphysema; imaging findings suggestive of underlying COPD. 3. Aortic atherosclerosis, in addition to three-vessel coronary artery disease. Assessment for potential risk factor modification, dietary therapy or pharmacologic therapy may be warranted, if clinically indicated. 4. Additional incidental findings, as above.  Aortic Atherosclerosis (ICD10-I70.0) and Emphysema (ICD10-J43.9).   Electronically Signed   By: Vinnie Langton M.D.   On: 08/18/2017 13:33 RADIOGRAPHIC STUDIES: I have personally reviewed the radiological images as listed and agreed with the findings in the report. No results found.     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  CT scan shows stable right upper lobe lung nodule not any worse.   #LUL cancer status post chemoradiation-stable chronic changes in the left hilar  region.  Likely scarring.  Stable.  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 [Dr.Fleming].   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 Malignancy; had previous colo [2-3 years].  Poor candidate for colonoscopy.  Stable.  # follow up in 6 months/labs- CT chest prior-   # I reviewed the blood work- with the patient in detail; also reviewed the imaging independently [as summarized above]; and with the patient in detail.   Cc; Dr.Sparks.    Orders Placed This Encounter  Procedures  . CT CHEST WO CONTRAST    Standing Status:   Future    Standing Expiration Date:   04/06/2019    Order Specific Question:   Preferred imaging location?    Answer:   [facility]    Order Specific Question:   Radiology Contrast Protocol - do NOT remove file path    Answer:   [email or web address]    Order Specific Question:   ** REASON FOR EXAM (FREE TEXT)    Answer:   lung cancer; on surveillaince     Cammie Sickle, MD 04/05/2018 1:19 PM

## 2018-04-05 NOTE — Progress Notes (Signed)
Radiation Oncology Follow up Note  Name: LEVESTER WALDRIDGE   Date:   04/05/2018 MRN:  [id number] DOB: 03/06/1941    This 77 y.o. male presents to the clinic today for 1 year follow-up status post SB RT to right lung for stage I non-small cell lung cancer.  REFERRING PROVIDER: [name], MD  HPI: patient is a 77 year old male we've been following status post SB RT to his right upper lobe for stage I non-small cell cancer. Seen today in routine follow-up he continues to do well still has trouble breathing although he does have significant COPD. We've also been following a right centimeter right upper lobe nodule.recent CT scan shows left hilum unchanged from prior examination as her right upper lobe and lingular nodules all stable. He does have significant emphysema.  COMPLICATIONS OF TREATMENT: none  FOLLOW UP COMPLIANCE: keeps appointments   PHYSICAL EXAM:  There were no vitals taken for this visit. Frail-appearing wheelchair-bound male in NAD.Well-developed well-nourished patient in NAD. HEENT reveals PERLA, EOMI, discs not visualized.  Oral cavity is clear. No oral mucosal lesions are identified. Neck is clear without evidence of cervical or supraclavicular adenopathy. Lungs are clear to A&P. Cardiac examination is essentially unremarkable with regular rate and rhythm without murmur rub or thrill. Abdomen is benign with no organomegaly or masses noted. Motor sensory and DTR levels are equal and symmetric in the upper and lower extremities. Cranial nerves II through XII are grossly intact. Proprioception is intact. No peripheral adenopathy or edema is identified. No motor or sensory levels are noted. Crude visual fields are within normal range.  RADIOLOGY RESULTS: CT scans are reviewed and compared to prior studies  PLAN: the present time everything appears stable on the patient's chest. He is oriented had follow-up CT scan ordered for March of 220. He continues close follow-up  care with medical oncology. I've asked to see him back in 1 year for follow-up. We'll be happy to reevaluate him at any time should further radiation treatment be indicated.  I would like to take this opportunity to thank you for allowing me to participate in the care of your patient.Noreene Filbert, MD

## 2018-04-14 ENCOUNTER — Other Ambulatory Visit: Payer: Self-pay | Admitting: Physician Assistant

## 2018-04-14 ENCOUNTER — Ambulatory Visit
Admission: RE | Admit: 2018-04-14 | Discharge: 2018-04-14 | Disposition: A | Discharge status: Home / Self Care | Payer: Medicare Other | Source: Ambulatory Visit | Attending: Physician Assistant | Admitting: Physician Assistant

## 2018-04-14 DIAGNOSIS — M7989 Other specified soft tissue disorders: Secondary | ICD-10-CM | POA: Insufficient documentation

## 2018-04-14 DIAGNOSIS — M79661 Pain in right lower leg: Secondary | ICD-10-CM | POA: Diagnosis present

## 2018-04-14 DIAGNOSIS — M7121 Synovial cyst of popliteal space [Baker], right knee: Secondary | ICD-10-CM | POA: Insufficient documentation

## 2018-08-08 ENCOUNTER — Inpatient Hospital Stay
Admission: EM | Admit: 2018-08-08 | Discharge: 2018-08-17 | DRG: 871 | Disposition: A | Discharge status: Home Health | Payer: Medicare Other | Attending: Internal Medicine | Admitting: Internal Medicine

## 2018-08-08 ENCOUNTER — Encounter: Payer: Self-pay | Admitting: Emergency Medicine

## 2018-08-08 ENCOUNTER — Emergency Department: Payer: Medicare Other

## 2018-08-08 ENCOUNTER — Other Ambulatory Visit: Payer: Self-pay

## 2018-08-08 DIAGNOSIS — K219 Gastro-esophageal reflux disease without esophagitis: Secondary | ICD-10-CM | POA: Diagnosis present

## 2018-08-08 DIAGNOSIS — E119 Type 2 diabetes mellitus without complications: Secondary | ICD-10-CM | POA: Diagnosis present

## 2018-08-08 DIAGNOSIS — N289 Disorder of kidney and ureter, unspecified: Secondary | ICD-10-CM | POA: Diagnosis present

## 2018-08-08 DIAGNOSIS — J432 Centrilobular emphysema: Secondary | ICD-10-CM | POA: Diagnosis present

## 2018-08-08 DIAGNOSIS — E871 Hypo-osmolality and hyponatremia: Secondary | ICD-10-CM | POA: Diagnosis present

## 2018-08-08 DIAGNOSIS — Z7982 Long term (current) use of aspirin: Secondary | ICD-10-CM

## 2018-08-08 DIAGNOSIS — Z87891 Personal history of nicotine dependence: Secondary | ICD-10-CM | POA: Diagnosis not present

## 2018-08-08 DIAGNOSIS — F329 Major depressive disorder, single episode, unspecified: Secondary | ICD-10-CM | POA: Diagnosis present

## 2018-08-08 DIAGNOSIS — Z923 Personal history of irradiation: Secondary | ICD-10-CM

## 2018-08-08 DIAGNOSIS — G4730 Sleep apnea, unspecified: Secondary | ICD-10-CM | POA: Diagnosis present

## 2018-08-08 DIAGNOSIS — Z5329 Procedure and treatment not carried out because of patient's decision for other reasons: Secondary | ICD-10-CM | POA: Diagnosis present

## 2018-08-08 DIAGNOSIS — R000 Tachycardia, unspecified: Secondary | ICD-10-CM | POA: Diagnosis present

## 2018-08-08 DIAGNOSIS — J181 Lobar pneumonia, unspecified organism: Secondary | ICD-10-CM | POA: Diagnosis present

## 2018-08-08 DIAGNOSIS — R0603 Acute respiratory distress: Secondary | ICD-10-CM | POA: Diagnosis not present

## 2018-08-08 DIAGNOSIS — E876 Hypokalemia: Secondary | ICD-10-CM | POA: Diagnosis present

## 2018-08-08 DIAGNOSIS — Z66 Do not resuscitate: Secondary | ICD-10-CM | POA: Diagnosis present

## 2018-08-08 DIAGNOSIS — A419 Sepsis, unspecified organism: Principal | ICD-10-CM | POA: Diagnosis present

## 2018-08-08 DIAGNOSIS — I10 Essential (primary) hypertension: Secondary | ICD-10-CM | POA: Diagnosis present

## 2018-08-08 DIAGNOSIS — J9601 Acute respiratory failure with hypoxia: Secondary | ICD-10-CM | POA: Diagnosis present

## 2018-08-08 DIAGNOSIS — J9622 Acute and chronic respiratory failure with hypercapnia: Secondary | ICD-10-CM | POA: Diagnosis present

## 2018-08-08 DIAGNOSIS — I2510 Atherosclerotic heart disease of native coronary artery without angina pectoris: Secondary | ICD-10-CM | POA: Diagnosis present

## 2018-08-08 DIAGNOSIS — Z9221 Personal history of antineoplastic chemotherapy: Secondary | ICD-10-CM

## 2018-08-08 DIAGNOSIS — Z880 Allergy status to penicillin: Secondary | ICD-10-CM | POA: Diagnosis not present

## 2018-08-08 DIAGNOSIS — R0602 Shortness of breath: Secondary | ICD-10-CM | POA: Diagnosis not present

## 2018-08-08 DIAGNOSIS — Z794 Long term (current) use of insulin: Secondary | ICD-10-CM | POA: Diagnosis not present

## 2018-08-08 DIAGNOSIS — J441 Chronic obstructive pulmonary disease with (acute) exacerbation: Secondary | ICD-10-CM

## 2018-08-08 DIAGNOSIS — Z9981 Dependence on supplemental oxygen: Secondary | ICD-10-CM | POA: Diagnosis not present

## 2018-08-08 DIAGNOSIS — J9621 Acute and chronic respiratory failure with hypoxia: Secondary | ICD-10-CM | POA: Diagnosis present

## 2018-08-08 DIAGNOSIS — Z85118 Personal history of other malignant neoplasm of bronchus and lung: Secondary | ICD-10-CM

## 2018-08-08 DIAGNOSIS — Z79899 Other long term (current) drug therapy: Secondary | ICD-10-CM

## 2018-08-08 DIAGNOSIS — Z515 Encounter for palliative care: Secondary | ICD-10-CM | POA: Diagnosis not present

## 2018-08-08 DIAGNOSIS — Z888 Allergy status to other drugs, medicaments and biological substances status: Secondary | ICD-10-CM | POA: Diagnosis not present

## 2018-08-08 DIAGNOSIS — Z8249 Family history of ischemic heart disease and other diseases of the circulatory system: Secondary | ICD-10-CM | POA: Diagnosis not present

## 2018-08-08 DIAGNOSIS — R451 Restlessness and agitation: Secondary | ICD-10-CM | POA: Diagnosis not present

## 2018-08-08 DIAGNOSIS — J189 Pneumonia, unspecified organism: Secondary | ICD-10-CM

## 2018-08-08 DIAGNOSIS — Z23 Encounter for immunization: Secondary | ICD-10-CM | POA: Diagnosis present

## 2018-08-08 DIAGNOSIS — E7800 Pure hypercholesterolemia, unspecified: Secondary | ICD-10-CM | POA: Diagnosis present

## 2018-08-08 DIAGNOSIS — Z7189 Other specified counseling: Secondary | ICD-10-CM | POA: Diagnosis not present

## 2018-08-08 LAB — COMPREHENSIVE METABOLIC PANEL
ALBUMIN: 3.6 g/dL (ref 3.5–5.0)
ALT: 11 U/L (ref 0–44)
ANION GAP: 11 (ref 5–15)
AST: 29 U/L (ref 15–41)
Alkaline Phosphatase: 77 U/L (ref 38–126)
BILIRUBIN TOTAL: 0.8 mg/dL (ref 0.3–1.2)
BUN: 34 mg/dL — ABNORMAL HIGH (ref 8–23)
CO2: 25 mmol/L (ref 22–32)
Calcium: 8.8 mg/dL — ABNORMAL LOW (ref 8.9–10.3)
Chloride: 95 mmol/L — ABNORMAL LOW (ref 98–111)
Creatinine, Ser: 1.26 mg/dL — ABNORMAL HIGH (ref 0.61–1.24)
GFR calc Af Amer: >60 mL/min (ref >60)
GFR, EST NON AFRICAN AMERICAN: 55 mL/min — AB (ref >60)
GLUCOSE: 156 mg/dL — AB (ref 70–99)
Potassium: 3.2 mmol/L — ABNORMAL LOW (ref 3.5–5.1)
Sodium: 131 mmol/L — ABNORMAL LOW (ref 135–145)
TOTAL PROTEIN: 7.3 g/dL (ref 6.5–8.1)

## 2018-08-08 LAB — URINALYSIS, COMPLETE (UACMP) WITH MICROSCOPIC
BILIRUBIN URINE: NEGATIVE
Bacteria, UA: NONE SEEN
Glucose, UA: NEGATIVE mg/dL
Ketones, ur: NEGATIVE mg/dL
Leukocytes, UA: NEGATIVE
Nitrite: NEGATIVE
Protein, ur: 100 mg/dL — AB
Specific Gravity, Urine: 1.017 (ref 1.005–1.030)
pH: 5 (ref 5.0–8.0)

## 2018-08-08 LAB — INFLUENZA PANEL BY PCR (TYPE A & B)
Influenza A By PCR: NEGATIVE
Influenza B By PCR: NEGATIVE

## 2018-08-08 LAB — BLOOD GAS, VENOUS
Acid-Base Excess: 1.7 mmol/L (ref 0.0–2.0)
Bicarbonate: 27.2 mmol/L (ref 20.0–28.0)
O2 SAT: 92.2 %
PCO2 VEN: 45 mmHg (ref 44.0–60.0)
Patient temperature: 37
pH, Ven: 7.39 (ref 7.250–7.430)
pO2, Ven: 65 mmHg — ABNORMAL HIGH (ref 32.0–45.0)

## 2018-08-08 LAB — CBC WITH DIFFERENTIAL/PLATELET
ABS IMMATURE GRANULOCYTES: 0.16 10*3/uL — AB (ref 0.00–0.07)
BASOS PCT: 0 %
Basophils Absolute: 0 10*3/uL (ref 0.0–0.1)
EOS ABS: 0 10*3/uL (ref 0.0–0.5)
EOS PCT: 0 %
HEMATOCRIT: 39.6 % (ref 39.0–52.0)
HEMOGLOBIN: 12.8 g/dL — AB (ref 13.0–17.0)
Immature Granulocytes: 1 %
Lymphocytes Relative: 2 %
Lymphs Abs: 0.4 10*3/uL — ABNORMAL LOW (ref 0.7–4.0)
MCH: 26.9 pg (ref 26.0–34.0)
MCHC: 32.3 g/dL (ref 30.0–36.0)
MCV: 83.2 fL (ref 80.0–100.0)
MONOS PCT: 5 %
Monocytes Absolute: 1 10*3/uL (ref 0.1–1.0)
NEUTROS PCT: 92 %
Neutro Abs: 19.1 10*3/uL — ABNORMAL HIGH (ref 1.7–7.7)
PLATELETS: 258 10*3/uL (ref 150–400)
RBC: 4.76 MIL/uL (ref 4.22–5.81)
RDW: 15.6 % — ABNORMAL HIGH (ref 11.5–15.5)
Smear Review: ADEQUATE
WBC: 20.7 10*3/uL — ABNORMAL HIGH (ref 4.0–10.5)
nRBC: 0 % (ref 0.0–0.2)

## 2018-08-08 LAB — GLUCOSE, CAPILLARY: Glucose-Capillary: 132 mg/dL — ABNORMAL HIGH (ref 70–99)

## 2018-08-08 LAB — LACTIC ACID, PLASMA: LACTIC ACID, VENOUS: 1.9 mmol/L (ref 0.5–1.9)

## 2018-08-08 LAB — PROTIME-INR
INR: 1.07
Prothrombin Time: 13.8 seconds (ref 11.4–15.2)

## 2018-08-08 LAB — MRSA PCR SCREENING: MRSA by PCR: NEGATIVE

## 2018-08-08 MED ORDER — POTASSIUM CHLORIDE 20 MEQ PO PACK
60.0000 meq | PACK | Freq: Once | ORAL | Status: AC
  Administered 2018-08-08: 60 meq via ORAL
  Filled 2018-08-08: qty 3

## 2018-08-08 MED ORDER — SODIUM CHLORIDE 0.9 % IV BOLUS
1000.0000 mL | Freq: Once | INTRAVENOUS | Status: AC
  Administered 2018-08-09: 1000 mL via INTRAVENOUS

## 2018-08-08 MED ORDER — POLYETHYLENE GLYCOL 3350 17 G PO PACK: 17.0000 g | PACK | Freq: Every day | ORAL | Status: DC | PRN

## 2018-08-08 MED ORDER — HYDROXYZINE HCL 10 MG PO TABS
10.0000 mg | ORAL_TABLET | Freq: Every day | ORAL | Status: DC
  Administered 2018-08-08 – 2018-08-16 (×8): 10 mg via ORAL
  Filled 2018-08-08 (×10): qty 1

## 2018-08-08 MED ORDER — IPRATROPIUM-ALBUTEROL 0.5-2.5 (3) MG/3ML IN SOLN
3.0000 mL | Freq: Once | RESPIRATORY_TRACT | Status: AC
  Administered 2018-08-08: 3 mL via RESPIRATORY_TRACT

## 2018-08-08 MED ORDER — SODIUM CHLORIDE 0.9 % IV BOLUS
500.0000 mL | Freq: Once | INTRAVENOUS | Status: AC
  Administered 2018-08-08: 500 mL via INTRAVENOUS

## 2018-08-08 MED ORDER — INSULIN ASPART 100 UNIT/ML ~~LOC~~ SOLN
0.0000 [IU] | Freq: Three times a day (TID) | SUBCUTANEOUS | Status: DC
  Administered 2018-08-08 – 2018-08-09 (×3): 2 [IU] via SUBCUTANEOUS
  Administered 2018-08-11: 3 [IU] via SUBCUTANEOUS
  Administered 2018-08-11: 2 [IU] via SUBCUTANEOUS
  Administered 2018-08-11: 3 [IU] via SUBCUTANEOUS
  Administered 2018-08-12: 7 [IU] via SUBCUTANEOUS
  Administered 2018-08-12: 9 [IU] via SUBCUTANEOUS
  Administered 2018-08-12: 5 [IU] via SUBCUTANEOUS
  Administered 2018-08-13: 7 [IU] via SUBCUTANEOUS
  Filled 2018-08-08 (×10): qty 1

## 2018-08-08 MED ORDER — LORAZEPAM 0.5 MG PO TABS
0.5000 mg | ORAL_TABLET | Freq: Every day | ORAL | Status: DC
  Administered 2018-08-08 – 2018-08-11 (×4): 0.5 mg via ORAL
  Filled 2018-08-08 (×5): qty 1

## 2018-08-08 MED ORDER — INSULIN DETEMIR 100 UNIT/ML ~~LOC~~ SOLN
20.0000 [IU] | Freq: Every day | SUBCUTANEOUS | Status: DC
  Administered 2018-08-09: 20 [IU] via SUBCUTANEOUS
  Filled 2018-08-08 (×2): qty 0.2

## 2018-08-08 MED ORDER — SODIUM CHLORIDE 0.9 % IV SOLN
500.0000 mg | INTRAVENOUS | Status: DC
  Administered 2018-08-08: 500 mg via INTRAVENOUS
  Filled 2018-08-08 (×2): qty 500

## 2018-08-08 MED ORDER — ENALAPRIL MALEATE 5 MG PO TABS
5.0000 mg | ORAL_TABLET | Freq: Every day | ORAL | Status: DC
  Administered 2018-08-10 – 2018-08-13 (×4): 5 mg via ORAL
  Filled 2018-08-08 (×5): qty 1

## 2018-08-08 MED ORDER — METHYLPREDNISOLONE SODIUM SUCC 125 MG IJ SOLR
125.0000 mg | Freq: Once | INTRAMUSCULAR | Status: AC
  Administered 2018-08-08: 125 mg via INTRAVENOUS
  Filled 2018-08-08: qty 2

## 2018-08-08 MED ORDER — BUPROPION HCL 100 MG PO TABS
100.0000 mg | ORAL_TABLET | Freq: Two times a day (BID) | ORAL | Status: DC
  Administered 2018-08-08 – 2018-08-13 (×8): 100 mg via ORAL
  Filled 2018-08-08 (×11): qty 1

## 2018-08-08 MED ORDER — SODIUM CHLORIDE 0.9 % IV SOLN
Freq: Once | INTRAVENOUS | Status: AC
  Administered 2018-08-08: 17:00:00 via INTRAVENOUS

## 2018-08-08 MED ORDER — ONDANSETRON HCL 4 MG PO TABS: 4.0000 mg | ORAL_TABLET | Freq: Four times a day (QID) | ORAL | Status: DC | PRN

## 2018-08-08 MED ORDER — SODIUM CHLORIDE 0.9 % IV SOLN: 500.0000 mg | INTRAVENOUS | Status: DC

## 2018-08-08 MED ORDER — HYDROCHLOROTHIAZIDE 25 MG PO TABS
25.0000 mg | ORAL_TABLET | Freq: Every day | ORAL | Status: DC
  Administered 2018-08-10 – 2018-08-13 (×4): 25 mg via ORAL
  Filled 2018-08-08 (×4): qty 1

## 2018-08-08 MED ORDER — SODIUM CHLORIDE 0.9 % IV SOLN
INTRAVENOUS | Status: DC
  Administered 2018-08-08 – 2018-08-09 (×2): via INTRAVENOUS

## 2018-08-08 MED ORDER — MAGNESIUM OXIDE 400 (241.3 MG) MG PO TABS
400.0000 mg | ORAL_TABLET | Freq: Every day | ORAL | Status: DC
  Administered 2018-08-10 – 2018-08-13 (×4): 400 mg via ORAL
  Filled 2018-08-08 (×4): qty 1

## 2018-08-08 MED ORDER — DILTIAZEM HCL ER COATED BEADS 180 MG PO CP24
180.0000 mg | ORAL_CAPSULE | Freq: Every day | ORAL | Status: DC
  Administered 2018-08-10 – 2018-08-13 (×4): 180 mg via ORAL
  Filled 2018-08-08 (×6): qty 1

## 2018-08-08 MED ORDER — THEOPHYLLINE ER 200 MG PO CP24
300.0000 mg | ORAL_CAPSULE | Freq: Two times a day (BID) | ORAL | Status: DC
  Filled 2018-08-08: qty 1

## 2018-08-08 MED ORDER — SODIUM CHLORIDE 0.9% FLUSH: 3.0000 mL | Freq: Once | INTRAVENOUS | Status: DC

## 2018-08-08 MED ORDER — THEOPHYLLINE ER 100 MG PO CP24
300.0000 mg | ORAL_CAPSULE | Freq: Two times a day (BID) | ORAL | Status: DC
  Administered 2018-08-08 – 2018-08-13 (×8): 300 mg via ORAL
  Filled 2018-08-08 (×11): qty 3

## 2018-08-08 MED ORDER — CITALOPRAM HYDROBROMIDE 20 MG PO TABS
20.0000 mg | ORAL_TABLET | Freq: Every day | ORAL | Status: AC
  Administered 2018-08-10 – 2018-08-16 (×6): 20 mg via ORAL
  Filled 2018-08-08 (×6): qty 1

## 2018-08-08 MED ORDER — CHLORHEXIDINE GLUCONATE 0.12 % MT SOLN
15.0000 mL | Freq: Two times a day (BID) | OROMUCOSAL | Status: DC
  Administered 2018-08-08 – 2018-08-13 (×8): 15 mL via OROMUCOSAL
  Filled 2018-08-08 (×8): qty 15

## 2018-08-08 MED ORDER — ACETAMINOPHEN 325 MG PO TABS
650.0000 mg | ORAL_TABLET | Freq: Four times a day (QID) | ORAL | Status: DC | PRN
  Administered 2018-08-15: 650 mg via ORAL
  Filled 2018-08-08: qty 2

## 2018-08-08 MED ORDER — ACETAMINOPHEN 650 MG RE SUPP: 650.0000 mg | Freq: Four times a day (QID) | RECTAL | Status: DC | PRN

## 2018-08-08 MED ORDER — ALBUTEROL SULFATE (2.5 MG/3ML) 0.083% IN NEBU
2.5000 mg | INHALATION_SOLUTION | RESPIRATORY_TRACT | Status: DC | PRN
  Administered 2018-08-08 – 2018-08-12 (×4): 2.5 mg via RESPIRATORY_TRACT
  Filled 2018-08-08 (×3): qty 3

## 2018-08-08 MED ORDER — FUROSEMIDE 20 MG PO TABS
20.0000 mg | ORAL_TABLET | Freq: Every day | ORAL | Status: DC
  Administered 2018-08-10 – 2018-08-15 (×5): 20 mg via ORAL
  Filled 2018-08-08 (×5): qty 1

## 2018-08-08 MED ORDER — ONDANSETRON HCL 4 MG/2ML IJ SOLN
4.0000 mg | Freq: Four times a day (QID) | INTRAMUSCULAR | Status: DC | PRN
  Administered 2018-08-08: 4 mg via INTRAVENOUS
  Filled 2018-08-08: qty 2

## 2018-08-08 MED ORDER — METHYLPREDNISOLONE SODIUM SUCC 125 MG IJ SOLR
60.0000 mg | INTRAMUSCULAR | Status: DC
  Administered 2018-08-08 – 2018-08-11 (×4): 60 mg via INTRAVENOUS
  Filled 2018-08-08 (×4): qty 2

## 2018-08-08 MED ORDER — SODIUM CHLORIDE 0.9 % IV SOLN
1.0000 g | INTRAVENOUS | Status: DC
  Filled 2018-08-08: qty 10

## 2018-08-08 MED ORDER — ENOXAPARIN SODIUM 40 MG/0.4ML ~~LOC~~ SOLN
40.0000 mg | SUBCUTANEOUS | Status: DC
  Administered 2018-08-08 – 2018-08-11 (×4): 40 mg via SUBCUTANEOUS
  Filled 2018-08-08 (×5): qty 0.4

## 2018-08-08 MED ORDER — TAMSULOSIN HCL 0.4 MG PO CAPS
0.4000 mg | ORAL_CAPSULE | Freq: Every day | ORAL | Status: DC
  Administered 2018-08-08 – 2018-08-16 (×8): 0.4 mg via ORAL
  Filled 2018-08-08 (×9): qty 1

## 2018-08-08 MED ORDER — IPRATROPIUM-ALBUTEROL 0.5-2.5 (3) MG/3ML IN SOLN
3.0000 mL | Freq: Once | RESPIRATORY_TRACT | Status: AC
  Administered 2018-08-08: 3 mL via RESPIRATORY_TRACT
  Filled 2018-08-08: qty 6

## 2018-08-08 MED ORDER — ORAL CARE MOUTH RINSE
15.0000 mL | Freq: Two times a day (BID) | OROMUCOSAL | Status: DC
  Administered 2018-08-10 (×2): 15 mL via OROMUCOSAL

## 2018-08-08 MED ORDER — INFLUENZA VAC SPLIT HIGH-DOSE 0.5 ML IM SUSY
0.5000 mL | PREFILLED_SYRINGE | INTRAMUSCULAR | Status: AC
  Administered 2018-08-09: 0.5 mL via INTRAMUSCULAR
  Filled 2018-08-08: qty 0.5

## 2018-08-08 MED ORDER — TIZANIDINE HCL 4 MG PO TABS
4.0000 mg | ORAL_TABLET | Freq: Every day | ORAL | Status: DC
  Administered 2018-08-08 – 2018-08-16 (×8): 4 mg via ORAL
  Filled 2018-08-08 (×10): qty 1

## 2018-08-08 MED ORDER — PRAVASTATIN SODIUM 20 MG PO TABS
80.0000 mg | ORAL_TABLET | Freq: Every day | ORAL | Status: DC
  Administered 2018-08-08 – 2018-08-10 (×3): 80 mg via ORAL
  Filled 2018-08-08 (×5): qty 4

## 2018-08-08 MED ORDER — SODIUM CHLORIDE 0.9 % IV SOLN
2.0000 g | INTRAVENOUS | Status: DC
  Administered 2018-08-08: 2 g via INTRAVENOUS
  Filled 2018-08-08 (×2): qty 20

## 2018-08-08 MED ORDER — PAROXETINE HCL 20 MG PO TABS
40.0000 mg | ORAL_TABLET | Freq: Two times a day (BID) | ORAL | Status: DC
  Administered 2018-08-08 – 2018-08-17 (×15): 40 mg via ORAL
  Filled 2018-08-08 (×18): qty 2

## 2018-08-08 MED ORDER — ALBUTEROL SULFATE (2.5 MG/3ML) 0.083% IN NEBU
3.0000 mL | INHALATION_SOLUTION | Freq: Two times a day (BID) | RESPIRATORY_TRACT | Status: DC
  Administered 2018-08-09 – 2018-08-13 (×8): 3 mL via RESPIRATORY_TRACT
  Filled 2018-08-08 (×10): qty 3

## 2018-08-08 NOTE — ED Notes (Signed)
Pt removed from bipap by MD Siadecki for food/drink and toileting.

## 2018-08-08 NOTE — Consult Note (Signed)
Reason for Consult: Respiratory distress Referring Physician: [name] is an 78 y.o. male.  HPI: Earl Clay is a 78 year old gentleman who has a past medical history remarkable for COPD with chronic oxygen dependency at 2-1/2 L, patient of Dr. Gust Clay, coronary artery disease, diabetes, hypertension, obstructive sleep apnea on home CPAP with supplemental oxygen, PTSD/anxiety/depression, prior history of lung cancer, x2, treated both with radiation therapy, presented with worsening shortness of breath over the past week.  Planing of increasing cough, congestion and purulent sputum production.  Complaining of diffuse malaise, weakness, aches.  Patient started on BiPAP.  Chest x-ray revealed multi lobar infiltrates consistent with multi lobar pneumonia.  Unable to come off of BiPAP with significant desaturation.  Being admitted to the intensive care unit  Past Medical History:  Diagnosis Date  . Asthma   . CAD (coronary artery disease)   . COPD (chronic obstructive pulmonary disease) ([place])   . Depression   . Diabetes mellitus without complication ([place])   . Difficult intubation   . GERD (gastroesophageal reflux disease)   . Hypertension   . Lung cancer ([place])   . 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 Pancreatic mass   . PTSD (post-traumatic stress disorder)   . Sleep apnea    CPAP with O2 2L/m at night    Past Surgical History:  Procedure Laterality Date  . HERNIA REPAIR Right    right inguinal hernia repair  . KNEE SURGERY Right    right knee repair  . UPPER ESOPHAGEAL ENDOSCOPIC ULTRASOUND (EUS) N/A 09/20/2015   Procedure: UPPER ESOPHAGEAL ENDOSCOPIC ULTRASOUND (EUS);  Surgeon: [name], MD;  Location: [facility] ENDOSCOPY;  Service: Endoscopy;  Laterality: N/A;    Family History  Problem Relation Age of Onset  . Hypertension Mother   . Congestive Heart Failure Father     Social History:  reports that he has quit smoking. He has never  used smokeless tobacco. No history on file for alcohol and drug.  Allergies:  Allergies  Allergen Reactions  . Ampicillin Hives and Other (See Comments)    Has patient had a PCN reaction causing immediate rash, facial/tongue/throat swelling, SOB or lightheadedness with hypotension: No Has patient had a PCN reaction causing severe rash involving mucus membranes or skin necrosis: No Has patient had a PCN reaction that required hospitalization No Has patient had a PCN reaction occurring within the last 10 years: No If all of the above answers are "NO", then may proceed with Cephalosporin use.  Marland Kitchen Penicillins Hives and Other (See Comments)    Has patient had a PCN reaction causing immediate rash, facial/tongue/throat swelling, SOB or lightheadedness with hypotension: No Has patient had a PCN reaction causing severe rash involving mucus membranes or skin necrosis: No Has patient had a PCN reaction that required hospitalization No Has patient had a PCN reaction occurring within the last 10 years: No If all of the above answers are "NO", then may proceed with Cephalosporin use.  . Zyban [Bupropion] Rash    Medications: I have reviewed the patient's current medications.  Results for orders placed or performed during the hospital encounter of 08/08/18 (from the past 48 hour(s))  Lactic acid, plasma     Status: None   Collection Time: 08/08/18 12:05 PM  Result Value Ref Range   Lactic Acid, Venous 1.9 0.5 - 1.9 mmol/L    Comment: Performed at [facility], [address], [place], [place] [address]  Urinalysis, Complete w  Microscopic     Status: Abnormal   Collection Time: 08/08/18 12:05 PM  Result Value Ref Range   Color, Urine YELLOW (A) YELLOW   APPearance CLEAR (A) CLEAR   Specific Gravity, Urine 1.017 1.005 - 1.030   pH 5.0 5.0 - 8.0   Glucose, UA NEGATIVE NEGATIVE mg/dL   Hgb urine dipstick MODERATE (A) NEGATIVE   Bilirubin Urine NEGATIVE NEGATIVE   Ketones, ur NEGATIVE  NEGATIVE mg/dL   Protein, ur 100 (A) NEGATIVE mg/dL   Nitrite NEGATIVE NEGATIVE   Leukocytes, UA NEGATIVE NEGATIVE   RBC / HPF 0-5 0 - 5 RBC/hpf   WBC, UA 0-5 0 - 5 WBC/hpf   Bacteria, UA NONE SEEN NONE SEEN   Squamous Epithelial / LPF 0-5 0 - 5    Comment: Performed at [facility], [address] [place], [place] [address]  Blood gas, venous     Status: Abnormal   Collection Time: 08/08/18 12:27 PM  Result Value Ref Range   pH, Ven 7.39 7.250 - 7.430   pCO2, Ven 45 44.0 - 60.0 mmHg   pO2, Ven 65.0 (H) 32.0 - 45.0 mmHg   Bicarbonate 27.2 20.0 - 28.0 mmol/L   Acid-Base Excess 1.7 0.0 - 2.0 mmol/L   O2 Saturation 92.2 %   Patient temperature 37.0    Collection site VEIN    Sample type VEIN     Comment: Performed at [facility], [address] [place], [place] [address]  Comprehensive metabolic panel     Status: Abnormal   Collection Time: 08/08/18 12:31 PM  Result Value Ref Range   Sodium 131 (L) 135 - 145 mmol/L   Potassium 3.2 (L) 3.5 - 5.1 mmol/L   Chloride 95 (L) 98 - 111 mmol/L   CO2 25 22 - 32 mmol/L   Glucose, Bld 156 (H) 70 - 99 mg/dL   BUN 34 (H) 8 - 23 mg/dL   Creatinine, Ser 1.26 (H) 0.61 - 1.24 mg/dL   Calcium 8.8 (L) 8.9 - 10.3 mg/dL   Total Protein 7.3 6.5 - 8.1 g/dL   Albumin 3.6 3.5 - 5.0 g/dL   AST 29 15 - 41 U/L   ALT 11 0 - 44 U/L   Alkaline Phosphatase 77 38 - 126 U/L   Total Bilirubin 0.8 0.3 - 1.2 mg/dL   GFR calc non Af Amer 55 (L) >60 mL/min   GFR calc Af Amer >60 >60 mL/min   Anion gap 11 5 - 15    Comment: Performed at [facility], [address] [place], [place] [address]  CBC with Differential     Status: Abnormal   Collection Time: 08/08/18 12:31 PM  Result Value Ref Range   WBC 20.7 (H) 4.0 - 10.5 K/uL   RBC 4.76 4.22 - 5.81 MIL/uL   Hemoglobin 12.8 (L) 13.0 - 17.0 g/dL   HCT 39.6 39.0 - 52.0 %   MCV 83.2 80.0 - 100.0 fL   MCH 26.9 26.0 - 34.0 pg   MCHC 32.3 30.0 - 36.0 g/dL   RDW 15.6 (H) 11.5 - 15.5  %   Platelets 258 150 - 400 K/uL   nRBC 0.0 0.0 - 0.2 %   Neutrophils Relative % 92 %   Neutro Abs 19.1 (H) 1.7 - 7.7 K/uL   Lymphocytes Relative 2 %   Lymphs Abs 0.4 (L) 0.7 - 4.0 K/uL   Monocytes Relative 5 %   Monocytes Absolute 1.0 0.1 - 1.0 K/uL  Eosinophils Relative 0 %   Eosinophils Absolute 0.0 0.0 - 0.5 K/uL   Basophils Relative 0 %   Basophils Absolute 0.0 0.0 - 0.1 K/uL   WBC Morphology MORPHOLOGY UNREMARKABLE    RBC Morphology MORPHOLOGY UNREMARKABLE    Smear Review PLATELETS APPEAR ADEQUATE    Immature Granulocytes 1 %   Abs Immature Granulocytes 0.16 (H) 0.00 - 0.07 K/uL    Comment: Performed at [facility], [address] [place], [place] [address]  Protime-INR     Status: None   Collection Time: 08/08/18 12:31 PM  Result Value Ref Range   Prothrombin Time 13.8 11.4 - 15.2 seconds   INR 1.07     Comment: Performed at [facility], [address] [place], [place] [address]  Influenza panel by PCR (type A & B)     Status: None   Collection Time: 08/08/18  1:14 PM  Result Value Ref Range   Influenza A By PCR NEGATIVE NEGATIVE   Influenza B By PCR NEGATIVE NEGATIVE    Comment: (NOTE) The Xpert Xpress Flu assay is intended as an aid in the diagnosis of  influenza and should not be used as a sole basis for treatment.  This  assay is FDA approved for nasopharyngeal swab specimens only. Nasal  washings and aspirates are unacceptable for Xpert Xpress Flu testing. Performed at [facility], [address] [place], [place] [address]     Dg Chest [place] 1 View  Result Date: 08/08/2018 CLINICAL DATA:  Short of breath EXAM: PORTABLE CHEST 1 VIEW COMPARISON:  03/29/2018 FINDINGS: There are patchy opacities in both mid and lower lung zones. Low volumes with bibasilar atelectasis. Normal heart size. No pneumothorax. Small left pleural effusion. IMPRESSION: Bilateral patchy pulmonary opacities are worrisome for bronchopneumonia or ill-defined  nodules. Electronically Signed   By: Marybelle Killings M.D.   On: 08/08/2018 12:57    ROS  Unable to obtain at this time secondary to dyspnea and BiPAP Blood pressure 136/76, pulse (!) 123, temperature 97.9 F (36.6 C), temperature source Oral, resp. rate (!) 24, height 5\' 6"  (1.676 m), weight 72.6 kg, SpO2 98 %. Physical Exam Vital signs: Please see the above listed vital signs Patient is presently on BiPAP, respiratory distress HEENT: On BiPAP, limited oral exam, trachea midline, no jugular venous distention noted, mild accessory muscle utilization Cardiovascular: Tachycardia appreciated, regular rhythm, sinus mechanism on monitor Pulmonary: Increased respiratory effort, markedly diminished breath sounds bilaterally with diffuse expiratory wheezing Abdominal: Positive bowel sounds, soft exam Extremities: No clubbing, cyanosis or edema noted Neurologic: Cranial nerves are grossly intact, no focal deficits appreciated on exam Cutaneous: No rashes or lesions noted  Pertinent labs reveal a white count of 20.7, sodium 131, potassium 3.2, BUN 34/creatinine 1.26, CO2 of 25- influenza studies.  Chest x-ray reveals bilateral patchy airspace disease consistent with multilobar pneumonia  Assessment/Plan:  Respiratory distress.  Multiple etiologies to include COPD, multilobar pneumonia.  Agree with azithromycin, Rocephin, albuterol, Atrovent, Solu-Medrol, BiPAP with close follow-up of respiratory status.  Leukocytosis secondary to pneumonia  Hyponatremia.  Will replace with saline  Hypokalemia.  Will replace  Renal insufficiency will follow closely  History of diabetes.  Will follow Dextrostix  Prior history of lung cancer.  Treated with radiation therapy x2 for recurrent  Earl Dellen, DO  Earl Clay 08/08/2018, 3:16 PM

## 2018-08-08 NOTE — H&P (Signed)
[name]: Earl Clay    MR#:  [id number]  DATE OF BIRTH:  01-27-1941  DATE OF ADMISSION:  08/08/2018  PRIMARY CARE PHYSICIAN: [name], MD   REQUESTING/REFERRING PHYSICIAN: Dr Cherylann Banas  CHIEF COMPLAINT:  creasing shortness of breath cough productive and generalized weakness for one week  HISTORY OF PRESENT ILLNESS:  Earl Clay  is a 78 y.o. 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  Workup in the ER showed patient was tachycardic and hypoxia with chest x-ray showing multifocal pneumonia. He received IV Rocephin and Zithromax. Patient currently is on BiPAP. Sats drop down into the lower 80s.  Patient is being admitted for acute on chronic hypoxic respiratory failure secondary to COPD exacerbation with multifocal pneumonia.  PAST MEDICAL HISTORY:   Past Medical History:  Diagnosis Date  . Asthma   . CAD (coronary artery disease)   . COPD (chronic obstructive pulmonary disease) ([place])   . Depression   . Diabetes mellitus without complication ([place])   . Difficult intubation   . GERD (gastroesophageal reflux disease)   . Hypertension   . Lung cancer ([place])   . 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 Pancreatic mass   . PTSD (post-traumatic stress disorder)   . Sleep apnea    CPAP with O2 2L/m at night    PAST SURGICAL HISTOIRY:   Past Surgical History:  Procedure Laterality Date  . HERNIA REPAIR Right    right inguinal hernia repair  . KNEE SURGERY Right    right knee repair  . UPPER ESOPHAGEAL ENDOSCOPIC ULTRASOUND (EUS) N/A 09/20/2015   Procedure: UPPER ESOPHAGEAL ENDOSCOPIC ULTRASOUND (EUS);  Surgeon:  [name], MD;  Location: [facility] ENDOSCOPY;  Service: Endoscopy;  Laterality: N/A;    SOCIAL HISTORY:   Social History   Tobacco Use  . Smoking status: Former Research scientist (life sciences)  . Smokeless tobacco: Never Used  Substance Use Topics  . Alcohol use: Not on file    FAMILY HISTORY:   Family History  Problem Relation Age of Onset  . Hypertension Mother   . Congestive Heart Failure Father     DRUG ALLERGIES:   Allergies  Allergen Reactions  . Ampicillin Hives and Other (See Comments)    Has patient had a PCN reaction causing immediate rash, facial/tongue/throat swelling, SOB or lightheadedness with hypotension: No Has patient had a PCN reaction causing severe rash involving mucus membranes or skin necrosis: No Has patient had a PCN reaction that required hospitalization No Has patient had a PCN reaction occurring within the last 10 years: No If all of the above answers are "NO", then may proceed with Cephalosporin use.  Marland Kitchen Penicillins Hives and Other (See Comments)    Has patient had a PCN reaction causing immediate rash, facial/tongue/throat swelling, SOB or lightheadedness with hypotension: No Has patient had a PCN reaction causing severe rash involving mucus membranes or skin necrosis: No Has patient had a PCN reaction that required hospitalization No Has patient had a PCN reaction occurring within the last 10 years: No If all of the above answers are "NO", then may proceed with Cephalosporin use.  . Zyban [Bupropion] Rash    REVIEW OF SYSTEMS:  Review of Systems  Constitutional: Positive for malaise/fatigue. Negative for chills, fever and weight loss.  HENT: Negative for ear discharge, ear pain and nosebleeds.   Eyes: Negative for blurred vision, pain and discharge.  Respiratory: Positive for cough, sputum production and shortness of breath. Negative for wheezing and stridor.   Cardiovascular: Negative for chest pain, palpitations, orthopnea and PND.  Gastrointestinal:  Negative for abdominal pain, diarrhea, nausea and vomiting.  Genitourinary: Negative for frequency and urgency.  Musculoskeletal: Negative for back pain and joint pain.  Neurological: Positive for weakness. Negative for sensory change, speech change and focal weakness.  Psychiatric/Behavioral: Negative for depression and hallucinations. The patient is not nervous/anxious.      MEDICATIONS AT HOME:   Prior to Admission medications   Medication Sig Start Date End Date Taking? Authorizing Provider  albuterol (PROVENTIL HFA;VENTOLIN HFA) 108 (90 Base) MCG/ACT inhaler Inhale 2 puffs into the lungs 2 (two) times daily.    Yes [provider]  aspirin 81 MG chewable tablet Chew 81 mg by mouth daily.   Yes [provider]  buPROPion (WELLBUTRIN) 100 MG tablet Take 100 mg by mouth 2 (two) times daily.   Yes [provider]  cetirizine (ZYRTEC) 10 MG tablet Take 10 mg by mouth every evening.    Yes [provider]  citalopram (CELEXA) 20 MG tablet Take 20 mg by mouth daily.    Yes [provider]  diltiazem (DILACOR XR) 180 MG 24 hr capsule Take 180 mg by mouth daily.   Yes [provider]  enalapril (VASOTEC) 5 MG tablet Take 5 mg by mouth daily.    Yes [provider]  furosemide (LASIX) 20 MG tablet Take 20 mg by mouth daily.    Yes [provider]  hydrochlorothiazide (HYDRODIURIL) 25 MG tablet Take 25 mg by mouth daily.    Yes [provider]  hydrOXYzine (ATARAX/VISTARIL) 10 MG tablet Take 10 mg by mouth at bedtime.   Yes [provider]  insulin aspart (NOVOLOG) 100 UNIT/ML injection Inject 10-20 Units into the skin 3 (three) times daily with meals as needed for high blood sugar. Pt uses as needed per sliding scale.    Yes [provider]  insulin detemir (LEVEMIR) 100 UNIT/ML injection Inject 20 Units into the skin daily.    Yes [provider]  LORazepam (ATIVAN) 0.5 MG tablet Take 0.5 mg  by mouth at bedtime.  02/08/17  Yes [provider]  magnesium oxide (MAG-OX) 400 MG tablet Take 400 mg by mouth daily.    Yes [provider]  PARoxetine (PAXIL) 40 MG tablet Take 40 mg by mouth 2 (two) times daily.    Yes [provider]  pravastatin (PRAVACHOL) 80 MG tablet Take 80 mg by mouth at bedtime.    Yes [provider]  sildenafil (VIAGRA) 100 MG tablet Take 100 mg by mouth daily as needed for erectile dysfunction.    Yes [provider]  tamsulosin (FLOMAX) 0.4 MG CAPS capsule Take 0.4 mg by mouth at bedtime.   Yes [provider]  theophylline (THEO-24) 300 MG 24 hr capsule Take 300 mg by mouth 2 (two) times daily.    Yes [provider]  tiotropium (SPIRIVA) 18 MCG inhalation capsule Place 18 mcg into inhaler and inhale daily.    Yes [provider]  tiZANidine (ZANAFLEX) 4 MG tablet Take 4 mg by mouth at bedtime.   Yes [provider]  traZODone (DESYREL) 100 MG tablet Take 100 mg by  mouth at bedtime.   Yes [provider]      VITAL SIGNS:  Blood pressure 136/76, pulse (!) 123, temperature 97.9 F (36.6 C), temperature source Oral, resp. rate (!) 24, height 5\' 6"  (1.676 m), weight 72.6 kg, SpO2 98 %.  PHYSICAL EXAMINATION:  GENERAL:  78 y.o.-year-old patient lying in the bed with mild to mod acute distress. Appears chronically ill on BiPAP EYES: Pupils equal, round, reactive to light and accommodation. No scleral icterus. Extraocular muscles intact.  HEENT: Head atraumatic, normocephalic. Oropharynx and nasopharynx clear.  NECK:  Supple, no jugular venous distention. No thyroid enlargement, no tenderness.  LUNGS: distant breath sounds bilaterally, no wheezing, rales,rhonchi or crepitation. No use of accessory muscles of respiration.  CARDIOVASCULAR: S1, S2 normal. No murmurs, rubs, or gallops. Tachycardia ABDOMEN: Soft, nontender, nondistended. Bowel sounds present. No organomegaly or  mass.  EXTREMITIES: No pedal edema, cyanosis, or clubbing.  NEUROLOGIC: Cranial nerves II through XII are intact. Muscle strength 5/5 in all extremities. Sensation intact. Gait not checked. Appears very weak and debilitated PSYCHIATRIC: The patient is alert and oriented x 3.  SKIN: No obvious rash, lesion, or ulcer.   LABORATORY PANEL:   CBC Recent Labs  Lab 08/08/18 1231  WBC 20.7*  HGB 12.8*  HCT 39.6  PLT 258   ------------------------------------------------------------------------------------------------------------------  Chemistries  Recent Labs  Lab 08/08/18 1231  NA 131*  K 3.2*  CL 95*  CO2 25  GLUCOSE 156*  BUN 34*  CREATININE 1.26*  CALCIUM 8.8*  AST 29  ALT 11  ALKPHOS 77  BILITOT 0.8   ------------------------------------------------------------------------------------------------------------------  Cardiac Enzymes No results for input(s): TROPONINI in the last 168 hours. ------------------------------------------------------------------------------------------------------------------  RADIOLOGY:  Dg Chest Port 1 View  Result Date: 08/08/2018 CLINICAL DATA:  Short of breath EXAM: PORTABLE CHEST 1 VIEW COMPARISON:  03/29/2018 FINDINGS: There are patchy opacities in both mid and lower lung zones. Low volumes with bibasilar atelectasis. Normal heart size. No pneumothorax. Small left pleural effusion. IMPRESSION: Bilateral patchy pulmonary opacities are worrisome for bronchopneumonia or ill-defined nodules. Electronically Signed   By: Marybelle Killings M.D.   On: 08/08/2018 12:57    EKG:    IMPRESSION AND PLAN:   Earl Clay  is a 78 y.o. male with a known history of chronic respiratory failure/end-stage emphysema on 425 L nasal cannula oxygen, diabetes, CAD, hypertension comes to the emergency room from home with daughter and family members with increasing shortness of breath productive cough and overall not feeling well with generalized  weakness.  1. Acute on chronic hypoxic respiratory failure secondary to COPD/emphysema in the setting of multifocal pneumonia -admit to step down ICU -IV Rocephin and Zithromax -follow-up blood culture sputum culture -spoke with Dr. Jefferson Fuel ICU attending -IV Solu-Medrol, nebulizer, oral inhalers -weaned out BiPAP to home oxygen. Patient normally uses around 4-4.5 L nasal cannula on a daily basis  2. Sepsis due to multifocal pneumonia -patient presented with cough, respiratory distress, tachycardia, elevated white count and positive chest x-ray -treatment as above  3. Type II diabetes -sliding scale insulin and Levemir  4. Hypertension -continue enalapril, hydrochlorothiazide, Lasix   5. History of lung cancer. Patient follows every six months with Dr. Donella Stade. Currently not getting any therapy.  6. DVT prophylaxis subcu Lovenox  Above was discussed with patient and daughter in the ER All the records are reviewed and case discussed with ED provider.   CODE STATUS: DNR  TOTAL TIME TAKING CARE OF THIS PATIENT: *50* minutes.    Idonna Heeren  Rylyn Zawistowski M.D on 08/08/2018 at 3:20 PM  Between 7am to 6pm - Pager - [phone]  After 6pm go to [email or web address] - password [email or web address] [facility]  [facility]  Office  [phone]  CC: Primary care physician; [name], MD

## 2018-08-08 NOTE — Progress Notes (Signed)
Family Meeting Note  Advance Directive:yes  Today a meeting took place with the patient and daughter  Patient has chronic respiratory failure/emphysema on 4 L nasal cannula oxygen, hypertension, CAD, diabetes comes in with increasing shortness of breath found to have hypoxia now on BiPAP and multifocal pneumonia. Patient is being admitted for step down ICU.  Code status address with patient he right away said he wants to be DNR daughter agrees  Time spent during discussion: 64 minutes  Fritzi Mandes, MD

## 2018-08-08 NOTE — ED Provider Notes (Signed)
[facility] Emergency Department Provider Note ____________________________________________   First MD Initiated Contact with Patient 08/08/18 1212     (approximate)  I have reviewed the triage vital signs and the nursing notes.   HISTORY  Chief Complaint Shortness of Breath    HPI Earl Clay is a 78 y.o. male with PMH as noted below including COPD and prior history of lung cancer on home O2 who presents with worsening shortness of breath over the last week, associated with malaise, generalized weakness, and body pain.  He denies fever or vomiting, but has had some diarrhea over the last day.  Past Medical History:  Diagnosis Date  . Asthma   . CAD (coronary artery disease)   . COPD (chronic obstructive pulmonary disease) ([place])   . Depression   . Diabetes mellitus without complication ([place])   . Difficult intubation   . GERD (gastroesophageal reflux disease)   . Hypertension   . Lung cancer ([place])   . 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 Pancreatic mass   . PTSD (post-traumatic stress disorder)   . Sleep apnea    CPAP with O2 2L/m at night    Patient Active Problem List   Diagnosis Date Noted  . Lung nodule, solitary 08/15/2016  . 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 Malnutrition of moderate degree 09/09/2015  . COPD exacerbation ([place]) 09/09/2015  . ARF (acute renal failure) ([place]) 09/07/2015  . Type 2 diabetes mellitus ([place]) 07/20/2015  . Type 2 diabetes mellitus with hyperglycemia ([place]) 05/16/2014  . History of cardiac catheterization 01/24/2014  . Breathlessness on exertion 01/24/2014  . Chronic obstructive bronchitis ([place]) 11/17/2013  . CAD in native artery 11/17/2013  . Benign essential HTN 11/17/2013  . Combined fat and carbohydrate induced hyperlipemia 11/17/2013  . Hypersomnia with sleep apnea 11/17/2013  . Panlobular emphysema ([place]) 11/17/2013  . Pure hypercholesterolemia 11/17/2013    . 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 Elevated prostate specific antigen (PSA) 08/18/2012  . Disorder of bladder function 08/18/2012  . Enlarged prostate with lower urinary tract symptoms (LUTS) 08/18/2012  . Incomplete bladder emptying 08/18/2012  . Benign prostatic hyperplasia with urinary obstruction 08/18/2012  . FOM (frequency of micturition) 08/18/2012    Past Surgical History:  Procedure Laterality Date  . HERNIA REPAIR Right    right inguinal hernia repair  . KNEE SURGERY Right    right knee repair  . UPPER ESOPHAGEAL ENDOSCOPIC ULTRASOUND (EUS) N/A 09/20/2015   Procedure: UPPER ESOPHAGEAL ENDOSCOPIC ULTRASOUND (EUS);  Surgeon: [name], MD;  Location: [facility] ENDOSCOPY;  Service: Endoscopy;  Laterality: N/A;    Prior to Admission medications   Medication Sig Start Date End Date Taking? Authorizing Provider  albuterol (PROVENTIL HFA;VENTOLIN HFA) 108 (90 Base) MCG/ACT inhaler Inhale 2 puffs into the lungs 2 (two) times daily.    Yes [provider]  aspirin 81 MG chewable tablet Chew 81 mg by mouth daily.   Yes [provider]  buPROPion (WELLBUTRIN) 100 MG tablet Take 100 mg by mouth 2 (two) times daily.   Yes [provider]  cetirizine (ZYRTEC) 10 MG tablet Take 10 mg by mouth every evening.    Yes [provider]  citalopram (CELEXA) 20 MG tablet Take 20 mg by mouth daily.    Yes [provider]  diltiazem (DILACOR XR) 180 MG 24 hr capsule Take 180 mg by mouth daily.   Yes [provider]  enalapril (VASOTEC)  5 MG tablet Take 5 mg by mouth daily.    Yes [provider]  furosemide (LASIX) 20 MG tablet Take 20 mg by mouth daily.    Yes [provider]  hydrochlorothiazide (HYDRODIURIL) 25 MG tablet Take 25 mg by mouth daily.    Yes [provider]  hydrOXYzine (ATARAX/VISTARIL) 10 MG tablet Take 10 mg by mouth at bedtime.   Yes [provider]  insulin aspart (NOVOLOG) 100  UNIT/ML injection Inject 10-20 Units into the skin 3 (three) times daily with meals as needed for high blood sugar. Pt uses as needed per sliding scale.    Yes [provider]  insulin detemir (LEVEMIR) 100 UNIT/ML injection Inject 20 Units into the skin daily.    Yes [provider]  LORazepam (ATIVAN) 0.5 MG tablet Take 0.5 mg by mouth at bedtime.  02/08/17  Yes [provider]  magnesium oxide (MAG-OX) 400 MG tablet Take 400 mg by mouth daily.    Yes [provider]  PARoxetine (PAXIL) 40 MG tablet Take 40 mg by mouth 2 (two) times daily.    Yes [provider]  pravastatin (PRAVACHOL) 80 MG tablet Take 80 mg by mouth at bedtime.    Yes [provider]  sildenafil (VIAGRA) 100 MG tablet Take 100 mg by mouth daily as needed for erectile dysfunction.    Yes [provider]  tamsulosin (FLOMAX) 0.4 MG CAPS capsule Take 0.4 mg by mouth at bedtime.   Yes [provider]  theophylline (THEO-24) 300 MG 24 hr capsule Take 300 mg by mouth 2 (two) times daily.    Yes [provider]  tiotropium (SPIRIVA) 18 MCG inhalation capsule Place 18 mcg into inhaler and inhale daily.    Yes [provider]  tiZANidine (ZANAFLEX) 4 MG tablet Take 4 mg by mouth at bedtime.   Yes [provider]  traZODone (DESYREL) 100 MG tablet Take 100 mg by mouth at bedtime.   Yes [provider]    Allergies Ampicillin; Penicillins; and Zyban [bupropion]  Family History  Problem Relation Age of Onset  . Hypertension Mother   . Congestive Heart Failure Father     Social History Social History   Tobacco Use  . Smoking status: Former Research scientist (life sciences)  . Smokeless tobacco: Never Used  Substance Use Topics  . Alcohol use: Not on file  . Drug use: Not on file    Review of Systems  Constitutional: No fever. Eyes: No redness. ENT: No sore throat. Cardiovascular: Denies chest pain. Respiratory: Positive for shortness of  breath. Gastrointestinal: Positive for diarrhea.  Genitourinary: Negative for flank pain.  Musculoskeletal: Negative for back pain. Skin: Negative for rash. Neurological: Negative for headache.   ____________________________________________   PHYSICAL EXAM:  VITAL SIGNS: ED Triage Vitals  Enc Vitals Group     BP 08/08/18 1152 136/76     Pulse Rate 08/08/18 1152 (!) 123     Resp 08/08/18 1152 (!) 24     Temp 08/08/18 1152 97.9 F (36.6 C)     Temp Source 08/08/18 1152 Oral     SpO2 08/08/18 1152 (!) 87 %     Weight 08/08/18 1203 160 lb (72.6 kg)     Height 08/08/18 1203 5\' 6"  (1.676 m)     Head Circumference --      Peak Flow --      Pain Score 08/08/18 1202 8     Pain Loc --  Pain Edu? --      Excl. in [place]? --     Constitutional: Alert and oriented.  Uncomfortable appearing.  Eyes: Conjunctivae are normal.  Head: Atraumatic. Nose: No congestion/rhinnorhea. Mouth/Throat: Mucous membranes are somewhat dry.   Neck: Normal range of motion.  Cardiovascular: Tachycardic, regular rhythm. Grossly normal heart sounds.  Good peripheral circulation. Respiratory: Increased respiratory effort with some retractions.  Decreased breath sounds bilaterally with scattered wheezes. Gastrointestinal: Soft and nontender. No distention.  Genitourinary: No flank tenderness. Musculoskeletal: No lower extremity edema.  Extremities warm and well perfused.  Neurologic:  Normal speech and language. No gross focal neurologic deficits are appreciated.  Skin:  Skin is warm and dry. No rash noted. Psychiatric: Speech and behavior are normal.  ____________________________________________   LABS (all labs ordered are listed, but only abnormal results are displayed)  Labs Reviewed  COMPREHENSIVE METABOLIC PANEL - Abnormal; Notable for the following components:      Result Value   Sodium 131 (*)    Potassium 3.2 (*)    Chloride 95 (*)    Glucose, Bld 156 (*)    BUN 34 (*)    Creatinine, Ser  1.26 (*)    Calcium 8.8 (*)    GFR calc non Af Amer 55 (*)    All other components within normal limits  CBC WITH DIFFERENTIAL/PLATELET - Abnormal; Notable for the following components:   WBC 20.7 (*)    Hemoglobin 12.8 (*)    RDW 15.6 (*)    Neutro Abs 19.1 (*)    Lymphs Abs 0.4 (*)    Abs Immature Granulocytes 0.16 (*)    All other components within normal limits  URINALYSIS, COMPLETE (UACMP) WITH MICROSCOPIC - Abnormal; Notable for the following components:   Color, Urine YELLOW (*)    APPearance CLEAR (*)    Hgb urine dipstick MODERATE (*)    Protein, ur 100 (*)    All other components within normal limits  BLOOD GAS, VENOUS - Abnormal; Notable for the following components:   pO2, Ven 65.0 (*)    All other components within normal limits  CULTURE, BLOOD (ROUTINE X 2)  CULTURE, BLOOD (ROUTINE X 2)  LACTIC ACID, PLASMA  PROTIME-INR  INFLUENZA PANEL BY PCR (TYPE A & B)   ____________________________________________  EKG  ED ECG REPORT I, Arta Silence, the attending physician, personally viewed and interpreted this ECG.  Date: 08/08/2018 EKG Time: 1202 Rate: 122 Rhythm: Sinus tachycardia with PVCs QRS Axis: normal Intervals: RBBB ST/T Wave abnormalities: Nonspecific ST abnormalities Narrative Interpretation: no evidence of acute ischemia  ____________________________________________  RADIOLOGY  CXR: Bilateral patchy opacities concerning for pneumonia  ____________________________________________   PROCEDURES  Procedure(s) performed: No  Procedures  Critical Care performed: Yes  CRITICAL CARE Performed by: Arta Silence   Total critical care time: 60 minutes  Critical care time was exclusive of separately billable procedures and treating other patients.  Critical care was necessary to treat or prevent imminent or life-threatening deterioration.  Critical care was time spent personally by me on the following activities: development of  treatment plan with patient and/or surrogate as well as nursing, discussions with consultants, evaluation of patient's response to treatment, examination of patient, obtaining history from patient or surrogate, ordering and performing treatments and interventions, ordering and review of laboratory studies, ordering and review of radiographic studies, pulse oximetry and re-evaluation of patient's condition. ____________________________________________   INITIAL IMPRESSION / ASSESSMENT AND PLAN / ED COURSE  Pertinent labs & imaging results that were  available during my care of the patient were reviewed by me and considered in my medical decision making (see chart for details).  78 year old male with a history of COPD and other PMH as noted above presents with worsening shortness of breath, generalized weakness and malaise over the last week.  He is on O2 chronically at home and CPAP at night.  I reviewed the past medical records in Epic; the patient has had no recent ED visits or admissions.  On exam the patient is somewhat weak and chronically ill-appearing, and is uncomfortable with increased work of breathing.  He is tachycardic.  He was hypoxic on arrival on O2 by nasal cannula.  The remainder of the exam is as described above with decreased breath sounds and wheezing bilaterally.  Overall presentation is most consistent with COPD exacerbation versus pneumonia or viral bronchitis.  The patient has no history of her clinical evidence for CHF.  Although he is tachycardic and hypoxic, given his history and the abnormal breath sounds I have a lower suspicion for PE.  Due to the patient's tachypnea and increased work of breathing I ordered BiPAP.  We will obtain chest x-ray, lab work-up, give bronchodilators and steroid, fluids, and reassess.  Anticipate admission.  ----------------------------------------- 2:57 PM on 08/08/2018 -----------------------------------------  Chest x-ray shows bilateral  opacities.  Influenza is negative.  The patient has not been hospitalized in the last 3 months and is coming from home so I will treat for CAP.  He was improved on BiPAP with good oxygenation and decreased work of breathing.  He will require admission.  I signed the patient out to the hospitalist Dr. Posey Pronto at approximately 2:55 PM ____________________________________________   FINAL CLINICAL IMPRESSION(S) / ED DIAGNOSES  Final diagnoses:  Acute respiratory failure with hypoxia ([place])  Community acquired pneumonia, unspecified laterality      NEW MEDICATIONS STARTED DURING THIS VISIT:  New Prescriptions   No medications on file     Note:  This document was prepared using Dragon voice recognition software and may include unintentional dictation errors.   Arta Silence, MD 08/08/18 1459

## 2018-08-08 NOTE — ED Notes (Signed)
MD Siadecki at bedside to explain delay in bipap admin.

## 2018-08-08 NOTE — ED Notes (Signed)
Pt requested to end breathing treatment prior to completion.

## 2018-08-09 LAB — URINALYSIS, ROUTINE W REFLEX MICROSCOPIC
Bacteria, UA: NONE SEEN
Bilirubin Urine: NEGATIVE
Glucose, UA: NEGATIVE mg/dL
Ketones, ur: NEGATIVE mg/dL
Leukocytes, UA: NEGATIVE
Nitrite: NEGATIVE
Protein, ur: 100 mg/dL — AB
Specific Gravity, Urine: 1.018 (ref 1.005–1.030)
pH: 5 (ref 5.0–8.0)

## 2018-08-09 LAB — BLOOD GAS, ARTERIAL
ACID-BASE DEFICIT: 0.4 mmol/L (ref 0.0–2.0)
Bicarbonate: 26.7 mmol/L (ref 20.0–28.0)
FIO2: 0.36
O2 Saturation: 88 %
PH ART: 7.31 — AB (ref 7.350–7.450)
Patient temperature: 37
pCO2 arterial: 53 mmHg — ABNORMAL HIGH (ref 32.0–48.0)
pO2, Arterial: 60 mmHg — ABNORMAL LOW (ref 83.0–108.0)

## 2018-08-09 LAB — BASIC METABOLIC PANEL
Anion gap: 7 (ref 5–15)
BUN: 30 mg/dL — ABNORMAL HIGH (ref 8–23)
CO2: 24 mmol/L (ref 22–32)
Calcium: 8 mg/dL — ABNORMAL LOW (ref 8.9–10.3)
Chloride: 105 mmol/L (ref 98–111)
Creatinine, Ser: 1.12 mg/dL (ref 0.61–1.24)
GFR calc non Af Amer: >60 mL/min (ref >60)
Glucose, Bld: 189 mg/dL — ABNORMAL HIGH (ref 70–99)
Potassium: 4.6 mmol/L (ref 3.5–5.1)
Sodium: 136 mmol/L (ref 135–145)

## 2018-08-09 LAB — CBC
HCT: 34.3 % — ABNORMAL LOW (ref 39.0–52.0)
Hemoglobin: 10.7 g/dL — ABNORMAL LOW (ref 13.0–17.0)
MCH: 26.7 pg (ref 26.0–34.0)
MCHC: 31.2 g/dL (ref 30.0–36.0)
MCV: 85.5 fL (ref 80.0–100.0)
NRBC: 0 % (ref 0.0–0.2)
Platelets: 199 10*3/uL (ref 150–400)
RBC: 4.01 MIL/uL — ABNORMAL LOW (ref 4.22–5.81)
RDW: 15.7 % — ABNORMAL HIGH (ref 11.5–15.5)
WBC: 13.9 10*3/uL — AB (ref 4.0–10.5)

## 2018-08-09 LAB — GLUCOSE, CAPILLARY
GLUCOSE-CAPILLARY: 147 mg/dL — AB (ref 70–99)
Glucose-Capillary: 171 mg/dL — ABNORMAL HIGH (ref 70–99)
Glucose-Capillary: 81 mg/dL (ref 70–99)

## 2018-08-09 LAB — PROCALCITONIN: Procalcitonin: 1.04 ng/mL

## 2018-08-09 LAB — MAGNESIUM: Magnesium: 2.1 mg/dL (ref 1.7–2.4)

## 2018-08-09 LAB — PHOSPHORUS: Phosphorus: 2.2 mg/dL — ABNORMAL LOW (ref 2.5–4.6)

## 2018-08-09 MED ORDER — SODIUM CHLORIDE 0.9 % IV SOLN
1.0000 g | INTRAVENOUS | Status: DC
  Administered 2018-08-09 – 2018-08-12 (×3): 1 g via INTRAVENOUS
  Filled 2018-08-09: qty 10
  Filled 2018-08-09: qty 1
  Filled 2018-08-09: qty 10
  Filled 2018-08-09 (×2): qty 1

## 2018-08-09 MED ORDER — METOPROLOL TARTRATE 5 MG/5ML IV SOLN
5.0000 mg | Freq: Once | INTRAVENOUS | Status: AC
  Administered 2018-08-09: 5 mg via INTRAVENOUS

## 2018-08-09 MED ORDER — DEXMEDETOMIDINE HCL IN NACL 400 MCG/100ML IV SOLN
0.0000 ug/kg/h | INTRAVENOUS | Status: DC
  Administered 2018-08-09: 0.4 ug/kg/h via INTRAVENOUS
  Filled 2018-08-09: qty 100

## 2018-08-09 MED ORDER — METOPROLOL TARTRATE 5 MG/5ML IV SOLN
INTRAVENOUS | Status: AC
  Filled 2018-08-09: qty 5

## 2018-08-09 MED ORDER — SODIUM CHLORIDE 0.9 % IV SOLN
500.0000 mg | INTRAVENOUS | Status: DC
  Administered 2018-08-10 – 2018-08-12 (×2): 500 mg via INTRAVENOUS
  Filled 2018-08-09 (×4): qty 500

## 2018-08-09 NOTE — Progress Notes (Signed)
[facility] [place] NAME: Earl Clay    MR#:  [id number]  DATE OF BIRTH:  03-Mar-1941  SUBJECTIVE: Admitted for sepsis, multifocal pneumonia.  On BiPAP now.  CHIEF COMPLAINT:   Chief Complaint  Patient presents with  . Shortness of Breath    REVIEW OF SYSTEMS:   Review of Systems  Constitutional: Positive for malaise/fatigue. Negative for chills and fever.  HENT: Negative for hearing loss.   Eyes: Negative for blurred vision, double vision and photophobia.  Respiratory: Positive for cough. Negative for hemoptysis.   Cardiovascular: Negative for palpitations, orthopnea and leg swelling.  Gastrointestinal: Positive for nausea. Negative for abdominal pain, diarrhea and vomiting.  Genitourinary: Negative for dysuria and urgency.  Musculoskeletal: Positive for myalgias. Negative for neck pain.  Skin: Negative for rash.  Neurological: Negative for dizziness, focal weakness, seizures, weakness and headaches.  Psychiatric/Behavioral: Negative for memory loss. The patient does not have insomnia.       DRUG ALLERGIES:   Allergies  Allergen Reactions  . Ampicillin Hives and Other (See Comments)    Has patient had a PCN reaction causing immediate rash, facial/tongue/throat swelling, SOB or lightheadedness with hypotension: No Has patient had a PCN reaction causing severe rash involving mucus membranes or skin necrosis: No Has patient had a PCN reaction that required hospitalization No Has patient had a PCN reaction occurring within the last 10 years: No If all of the above answers are "NO", then may proceed with Cephalosporin use.  Marland Kitchen Penicillins Hives and Other (See Comments)    Has patient had a PCN reaction causing immediate rash, facial/tongue/throat swelling, SOB or lightheadedness with hypotension: No Has patient had a PCN reaction causing severe rash involving mucus membranes or skin necrosis: No Has patient had a PCN  reaction that required hospitalization No Has patient had a PCN reaction occurring within the last 10 years: No If all of the above answers are "NO", then may proceed with Cephalosporin use.  . Zyban [Bupropion] Rash    VITALS:  Blood pressure 109/60, pulse (!) 106, temperature 97.9 F (36.6 C), temperature source Axillary, resp. rate 19, height 5\' 6"  (1.676 m), weight 71.4 kg, SpO2 99 %.  PHYSICAL EXAMINATION:  GENERAL:  78 y.o.-year-old patient lying in the bed with no acute distress.  On BiPAP. EYES: Pupils equal, round, reactive to light and accommodation. No scleral icterus. Extraocular muscles intact.  HEENT: Head atraumatic, normocephalic. Oropharynx and nasopharynx clear.  NECK:  Supple, no jugular venous distention. No thyroid enlargement, no tenderness.  LUNGS:  diminished air entry bilaterally. CARDIOVASCULAR: S1, S2 normal. No murmurs, rubs, or gallops.  ABDOMEN: Soft, nontender, nondistended. Bowel sounds present. No organomegaly or mass.  EXTREMITIES: No pedal edema, cyanosis, or clubbing.  NEUROLOGIC: Cranial nerves II through XII are intact. Muscle strength 5/5 in all extremities. Sensation intact. Gait not checked.  PSYCHIATRIC: The patient is alert and oriented x 3.  SKIN: No obvious rash, lesion, or ulcer.    LABORATORY PANEL:   CBC Recent Labs  Lab 08/09/18 0509  WBC 13.9*  HGB 10.7*  HCT 34.3*  PLT 199   ------------------------------------------------------------------------------------------------------------------  Chemistries  Recent Labs  Lab 08/08/18 1231 08/09/18 0509  NA 131* 136  K 3.2* 4.6  CL 95* 105  CO2 25 24  GLUCOSE 156* 189*  BUN 34* 30*  CREATININE 1.26* 1.12  CALCIUM 8.8* 8.0*  MG  --  2.1  AST 29  --   ALT 11  --  ALKPHOS 77  --   BILITOT 0.8  --    ------------------------------------------------------------------------------------------------------------------  Cardiac Enzymes No results for input(s): TROPONINI in  the last 168 hours. ------------------------------------------------------------------------------------------------------------------  RADIOLOGY:  Dg Chest Port 1 View  Result Date: 08/08/2018 CLINICAL DATA:  Short of breath EXAM: PORTABLE CHEST 1 VIEW COMPARISON:  03/29/2018 FINDINGS: There are patchy opacities in both mid and lower lung zones. Low volumes with bibasilar atelectasis. Normal heart size. No pneumothorax. Small left pleural effusion. IMPRESSION: Bilateral patchy pulmonary opacities are worrisome for bronchopneumonia or ill-defined nodules. Electronically Signed   By: Marybelle Killings M.D.   On: 08/08/2018 12:57    EKG:   Orders placed or performed during the hospital encounter of 09/07/15  . ED EKG  . ED EKG  . EKG 12-Lead  . EKG 12-Lead  . EKG 12-Lead  . EKG 12-Lead  . EKG 12-Lead  . EKG 12-Lead  . EKG 12-Lead  . EKG 12-Lead  . EKG 12-Lead  . EKG 12-Lead  . EKG 12-Lead  . EKG 12-Lead  . EKG    ASSESSMENT AND PLAN:    Acute on chronic hypoxic respiratory failure secondary to COPD exacerbation: Patient on BiPAP.  Admit sats dropping to low 80s on nasal oxygen, continue BiPAP for today, patient is monitored in stepdown. 2.  Multifocal pneumonia causing acute on chronic respiratory failure, continue BiPAP, decrease FiO2 as tolerated, continue IV antibiotics. 3.  Sepsis present on admission secondary to pneumonia: Evidence of cough respiratory rate, tachycardia, elevated white count, WBC improving, continue hydration, procalcitonin is 1.04. 4 hypokalemia: Replaced. 5.  Diabetes mellitus type 2 ; continue sliding scale insulin with coverage, Levemir. #6 COPD exacerbation, no wheezing, continue bronchodilators, IV steroids, Support.  Patient is on theophylline.  Continue. 7.  Essential hypertension: Controlled. 8.  History of right lung cancer, has history of emphysema. Consult palliative care, discussed with [facility] from palliative care. All the records are reviewed and  case discussed with Care Management/Social Workerr. Management plans discussed with the patient, family and they are in agreement.  CODE STATUS: DNR  TOTAL TIME TAKING CARE OF THIS PATIENT 38:minutes.   Possible discharge in next 1 to 2 days.  Patient is to be off the BiPAP before working with physical therapy.Epifanio Lesches M.D on 08/09/2018 at 12:33 PM  Between 7am to 6pm - Pager - [phone]  After 6pm go to [email or web address] - password [email or web address] [facility]  Office  [phone]  CC: Primary care physician; [name], MD   Note: This dictation was prepared with Dragon dictation along with smaller phrase technology. Any transcriptional errors that result from this process are unintentional.

## 2018-08-09 NOTE — Progress Notes (Signed)
PT Cancellation Note  Patient Details Name: Earl Clay MRN: [id number] DOB: [birth year]   Cancelled Treatment:    Reason Eval/Treat Not Completed: Patient not medically ready.  PT consult received.  Chart reviewed.  Pt currently on Bi-Pap.  Will hold PT at this time and re-attempt PT evaluation at a later date/time as medically appropriate.  Leitha Bleak, PT 08/09/18, 9:20 AM [phone]

## 2018-08-09 NOTE — Progress Notes (Signed)
Pt trailed on HFNC bubble bottle FiO2 titrated to maintain Saturations 88-92%

## 2018-08-09 NOTE — Care Management (Addendum)
Patient may qualify for home NIV. He will need ABG on supplemental o2/room air (not on Bipap) OR bedside spirometry study.  RT ICU updated. Patient is currently requiring Bipap. RNCM will follow.  [facility] will review. NIV covered at 100% if patient qualifies.

## 2018-08-09 NOTE — Progress Notes (Signed)
Attempted to trial pt off bi-pap to 4 LPM. Pt de-satted to 82% within 2-3 minutes, pt placed back on bi-pap and FiO2 decreased to 45%.

## 2018-08-09 NOTE — Progress Notes (Signed)
Pt became hypertensive and tachycardic. Requested to replace bi-pap. Bi-pap replaced at this time.

## 2018-08-09 NOTE — Progress Notes (Signed)
Pt requesting to remove bi-pap to eat. Explained to pt that his heart rate and blood pressure did not tolerate him being off the bi-pap and there is a possibility that he could have an arrhythmia or other adverse cardiac event. I also explained to pt that he could aspirate on the food when he chooses to put the bi-pap back on. Pt stated he understood the risks of same and continued to request to eat. Bi-pap removed so that pt could eat. Pt is a DNR. Will monitor closely.

## 2018-08-09 NOTE — Care Management (Signed)
Chronic home O2 through [facility].

## 2018-08-09 NOTE — Progress Notes (Signed)
CRITICAL CARE NOTE  CC  follow up respiratory failure due to COPD exacerbation  SUBJECTIVE Clinically improved, is on BiPAP with breaks for nourishment. Daughter at [place] discussed case and answered questions.     BP (!) 113/59 (BP Location: Right Arm)   Pulse (!) 101   Temp 97.8 F (36.6 C) (Axillary)   Resp (!) 23   Ht 5\' 6"  (1.676 m)   Wt 71.4 kg   SpO2 100%   BMI 25.41 kg/m    REVIEW OF SYSTEMS  PATIENT IS UNABLE TO PROVIDE COMPLETE REVIEW OF SYSTEMS DUE TO SEVERE CRITICAL ILLNESS   PHYSICAL EXAMINATION:  GENERAL:critically ill appearing, +resp distress HEAD: Normocephalic, atraumatic.  EYES: Pupils equal, round, reactive to light.  No scleral icterus.  MOUTH: Moist mucosal membrane. NECK: Supple. No thyromegaly. No nodules. No JVD.  PULMONARY: +rhonchi, +wheezing CARDIOVASCULAR: S1 and S2. Regular rate and rhythm. No murmurs, rubs, or gallops.  GASTROINTESTINAL: Soft, nontender, -distended. No masses. Positive bowel sounds. No hepatosplenomegaly.  MUSCULOSKELETAL: No swelling, clubbing, or edema.  NEUROLOGIC: obtunded, GCS<8 SKIN:intact,warm,dry      Indwelling Urinary Catheter continued, requirement due to   Reason to continue Indwelling Urinary Catheter for strict Intake/Output monitoring for hemodynamic instability   Central Line continued, requirement due to   Reason to continue [employer] Monitoring of central venous pressure or other hemodynamic parameters   Ventilator continued, requirement due to, resp failure    Ventilator Sedation RASS 0 to -2     ASSESSMENT AND PLAN SYNOPSIS   Severe Hypoxic and Hypercapnic Respiratory Failure -continue BiPAP overnight -COPD care path with antibiotics/prednisone -Bronchopulmonary hygiene, therapy incentive spirometer at bedside -History of lung CA, on home O2   Community aquired Pneumonia Continue Rocephin and Zithromax -Complicated by severe COPD/centrilobular emphysema and  previous lung CA   Acute renal insufficiency- KDIGO1 -Appears to be prerenal improved with hydration -follow chem 7 -follow UO -continue Foley Catheter-assess need daily   NEUROLOGY -Patient improved post BiPAP   CARDIAC ICU monitoring  ID -continue IV abx as prescibed -follow up cultures  GI/Nutrition GI PROPHYLAXIS as indicated DIET-->TF's as tolerated Constipation protocol as indicated  ENDO - ICU hypoglycemic\Hyperglycemia protocol -check FSBS per protocol   ELECTROLYTES -follow labs as needed -replace as needed -pharmacy consultation and following   DVT/GI PRX ordered TRANSFUSIONS AS NEEDED MONITOR FSBS ASSESS the need for LABS as needed   Critical Care Time devoted to patient care services described in this note is 36 minutes.     Overall, patient is critically ill, prognosis is guarded.  Patient with Multiorgan failure and at high risk for cardiac arrest and death.     Ottie Glazier, M.D.  Pulmonary & Critical Care Medicine

## 2018-08-10 DIAGNOSIS — J9621 Acute and chronic respiratory failure with hypoxia: Secondary | ICD-10-CM

## 2018-08-10 DIAGNOSIS — J189 Pneumonia, unspecified organism: Secondary | ICD-10-CM

## 2018-08-10 DIAGNOSIS — Z7189 Other specified counseling: Secondary | ICD-10-CM

## 2018-08-10 DIAGNOSIS — Z515 Encounter for palliative care: Secondary | ICD-10-CM

## 2018-08-10 DIAGNOSIS — J9601 Acute respiratory failure with hypoxia: Secondary | ICD-10-CM

## 2018-08-10 DIAGNOSIS — Z66 Do not resuscitate: Secondary | ICD-10-CM

## 2018-08-10 LAB — BASIC METABOLIC PANEL
Anion gap: 7 (ref 5–15)
BUN: 35 mg/dL — ABNORMAL HIGH (ref 8–23)
CHLORIDE: 104 mmol/L (ref 98–111)
CO2: 27 mmol/L (ref 22–32)
Calcium: 8.3 mg/dL — ABNORMAL LOW (ref 8.9–10.3)
Creatinine, Ser: 1.16 mg/dL (ref 0.61–1.24)
GFR calc Af Amer: >60 mL/min (ref >60)
GFR calc non Af Amer: >60 mL/min (ref >60)
Glucose, Bld: 75 mg/dL (ref 70–99)
Potassium: 4.2 mmol/L (ref 3.5–5.1)
Sodium: 138 mmol/L (ref 135–145)

## 2018-08-10 LAB — CBC WITH DIFFERENTIAL/PLATELET
Abs Immature Granulocytes: 0.1 10*3/uL — ABNORMAL HIGH (ref 0.00–0.07)
Basophils Absolute: 0 10*3/uL (ref 0.0–0.1)
Basophils Relative: 0 %
EOS ABS: 0 10*3/uL (ref 0.0–0.5)
Eosinophils Relative: 0 %
HCT: 36.2 % — ABNORMAL LOW (ref 39.0–52.0)
Hemoglobin: 11.1 g/dL — ABNORMAL LOW (ref 13.0–17.0)
Immature Granulocytes: 1 %
Lymphocytes Relative: 2 %
Lymphs Abs: 0.2 10*3/uL — ABNORMAL LOW (ref 0.7–4.0)
MCH: 26.8 pg (ref 26.0–34.0)
MCHC: 30.7 g/dL (ref 30.0–36.0)
MCV: 87.4 fL (ref 80.0–100.0)
Monocytes Absolute: 0.4 10*3/uL (ref 0.1–1.0)
Monocytes Relative: 3 %
NRBC: 0 % (ref 0.0–0.2)
Neutro Abs: 13 10*3/uL — ABNORMAL HIGH (ref 1.7–7.7)
Neutrophils Relative %: 94 %
Platelets: 227 10*3/uL (ref 150–400)
RBC: 4.14 MIL/uL — ABNORMAL LOW (ref 4.22–5.81)
RDW: 15.9 % — ABNORMAL HIGH (ref 11.5–15.5)
WBC: 13.7 10*3/uL — ABNORMAL HIGH (ref 4.0–10.5)

## 2018-08-10 LAB — URINE CULTURE: CULTURE: NO GROWTH

## 2018-08-10 LAB — GLUCOSE, CAPILLARY
GLUCOSE-CAPILLARY: 81 mg/dL (ref 70–99)
Glucose-Capillary: 61 mg/dL — ABNORMAL LOW (ref 70–99)
Glucose-Capillary: 163 mg/dL — ABNORMAL HIGH (ref 70–99)
Glucose-Capillary: 93 mg/dL (ref 70–99)

## 2018-08-10 LAB — PHOSPHORUS: Phosphorus: 2.4 mg/dL — ABNORMAL LOW (ref 2.5–4.6)

## 2018-08-10 MED ORDER — MORPHINE SULFATE (PF) 2 MG/ML IV SOLN
1.0000 mg | INTRAVENOUS | Status: DC | PRN
  Administered 2018-08-10 – 2018-08-13 (×10): 2 mg via INTRAVENOUS
  Filled 2018-08-10 (×10): qty 1

## 2018-08-10 MED ORDER — DEXTROSE 50 % IV SOLN
1.0000 | Freq: Once | INTRAVENOUS | Status: AC
  Administered 2018-08-10: 50 mL via INTRAVENOUS

## 2018-08-10 MED ORDER — INSULIN DETEMIR 100 UNIT/ML ~~LOC~~ SOLN
12.0000 [IU] | Freq: Every day | SUBCUTANEOUS | Status: DC
  Administered 2018-08-10 – 2018-08-12 (×3): 12 [IU] via SUBCUTANEOUS
  Filled 2018-08-10 (×3): qty 0.12

## 2018-08-10 MED ORDER — DEXTROSE 50 % IV SOLN
INTRAVENOUS | Status: AC
  Administered 2018-08-10: 50 mL via INTRAVENOUS
  Filled 2018-08-10: qty 50

## 2018-08-10 MED ORDER — LORAZEPAM 0.5 MG PO TABS
0.5000 mg | ORAL_TABLET | Freq: Two times a day (BID) | ORAL | Status: DC | PRN
  Administered 2018-08-10 – 2018-08-13 (×4): 0.5 mg via ORAL
  Filled 2018-08-10 (×4): qty 1

## 2018-08-10 NOTE — Progress Notes (Signed)
Inpatient Diabetes Program Recommendations  AACE/ADA: New Consensus Statement on Inpatient Glycemic Control (2015)  Target Ranges:  Prepandial:   less than 140 mg/dL      Peak postprandial:   less than 180 mg/dL (1-2 hours)      Critically ill patients:  140 - 180 mg/dL   Lab Results  Component Value Date   GLUCAP 163 (H) 08/10/2018    Review of Glycemic Control Results for Earl Clay, Earl Clay (MRN [id number]) as of 08/10/2018 12:21  Ref. Range 08/09/2018 07:14 08/09/2018 11:10 08/09/2018 21:39 08/10/2018 07:25 08/10/2018 11:31  Glucose-Capillary Latest Ref Range: 70 - 99 mg/dL 171 (H) 147 (H) 81 61 (L) 163 (H)   Diabetes history: DM 2 Outpatient Diabetes medications:  Novolog 10-20 units tid with meals, Levemir 20 units daily Current orders for Inpatient glycemic control:  Levemir 20 units daily, Novolog sensitive tid with meals Inpatient Diabetes Program Recommendations:   Note blood sugar of 61 mg/dL.  Recommend reducing Levemir to 12 units daily.   Thanks,  Adah Perl, RN, BC-ADM Inpatient Diabetes Coordinator Pager [phone] (8a-5p)

## 2018-08-10 NOTE — Progress Notes (Signed)
PT Cancellation Note  Patient Details Name: Earl Clay MRN: [id number] DOB: 1940-08-12   Cancelled Treatment:    Reason Eval/Treat Not Completed: Patient not medically ready.  Chart reviewed and discussed with pt's nurse.  Pt still on BiPAP.  Will hold PT at this time and re-attempt PT evaluation at a later date/time as medically appropriate.  Leitha Bleak, PT 08/10/18, 10:30 AM [phone]

## 2018-08-10 NOTE — Care Management (Addendum)
RNCM contacted by [facility] rep to follow up on home NIV. Per Leroy Sea, patient's daughter was upset and asked rep "why he would offer that and that patient was going to die here at the hospital".  Patient is alert and oriented- sitting independently on bed. He is eager to discharge to home.  Still on and off Bipap.  RNCM will follow palliative notes which are not in. Update: RNCM spoke with with patient's lady friend (not his wife) and his granddaughter after they pulled me out of patient's room.  She said she "can only take care of him for a little while".  Indicating if he goes home with hospice and dies sooner than later.  I explained that patient has capacity to make decisions. I also explained that he cannot have an NIV and receive hospice services at home. However, he could received home health and palliative services if he is not ready for hospice services. Again, I explained that patient would be the decision maker.  Palliative will meet with patient at 10AM tomorrow per his friend. Granddaughter states that her mother/patient daughter will also be here. Patient was on bipap when [facility] entered his room.

## 2018-08-10 NOTE — Plan of Care (Signed)
Pt had a restless night, Cont on BiPAP all night. Very little reserve when taken off BiPAP, oxygen level will drop in few seconds from 99% on BiPAP to 70's.  Pt slept better dangle  and leaning at edge of bedside table. Denies any pain or needs. Care plan reviewed with patient.

## 2018-08-10 NOTE — Progress Notes (Signed)
[facility] at [place] NAME: Earl Clay    MR#:  [id number]  DATE OF BIRTH:  1941/05/01  SUBJECTIVE: Still on BiPAP at FiO2 35%.  Tachycardic with heart rate up to 115 bpm.  CHIEF COMPLAINT:   Chief Complaint  Patient presents with  . Shortness of Breath    REVIEW OF SYSTEMS:   Review of Systems  Constitutional: Positive for malaise/fatigue. Negative for chills and fever.  HENT: Negative for hearing loss.   Eyes: Negative for blurred vision, double vision and photophobia.  Respiratory: Positive for cough. Negative for hemoptysis.   Cardiovascular: Negative for palpitations, orthopnea and leg swelling.  Gastrointestinal: Positive for nausea. Negative for abdominal pain, diarrhea and vomiting.  Genitourinary: Negative for dysuria and urgency.  Musculoskeletal: Positive for myalgias. Negative for neck pain.  Skin: Negative for rash.  Neurological: Negative for dizziness, focal weakness, seizures, weakness and headaches.  Psychiatric/Behavioral: Negative for memory loss. The patient does not have insomnia.       DRUG ALLERGIES:   Allergies  Allergen Reactions  . Ampicillin Hives and Other (See Comments)    Has patient had a PCN reaction causing immediate rash, facial/tongue/throat swelling, SOB or lightheadedness with hypotension: No Has patient had a PCN reaction causing severe rash involving mucus membranes or skin necrosis: No Has patient had a PCN reaction that required hospitalization No Has patient had a PCN reaction occurring within the last 10 years: No If all of the above answers are "NO", then may proceed with Cephalosporin use.  Marland Kitchen Penicillins Hives and Other (See Comments)    Has patient had a PCN reaction causing immediate rash, facial/tongue/throat swelling, SOB or lightheadedness with hypotension: No Has patient had a PCN reaction causing severe rash involving mucus membranes or skin necrosis: No Has  patient had a PCN reaction that required hospitalization No Has patient had a PCN reaction occurring within the last 10 years: No If all of the above answers are "NO", then may proceed with Cephalosporin use.  . Zyban [Bupropion] Rash    VITALS:  Blood pressure 133/66, pulse (!) 114, temperature 98.2 F (36.8 C), temperature source Oral, resp. rate (!) 21, height 5\' 6"  (1.676 m), weight 71.4 kg, SpO2 95 %.  PHYSICAL EXAMINATION:  GENERAL:  78 y.o.-year-old patient lying in the bed with no acute distress.  On BiPAP. EYES: Pupils equal, round, reactive to light and accommodation. No scleral icterus. Extraocular muscles intact.  HEENT: Head atraumatic, normocephalic. Oropharynx and nasopharynx clear.  NECK:  Supple, no jugular venous distention. No thyroid enlargement, no tenderness.  LUNGS:  diminished air entry bilaterally. CARDIOVASCULAR: S1, S2 normal. No murmurs, rubs, or gallops.  ABDOMEN: Soft, nontender, nondistended. Bowel sounds present. No organomegaly or mass.  EXTREMITIES: No pedal edema, cyanosis, or clubbing.  NEUROLOGIC: Cranial nerves II through XII are intact. Muscle strength 5/5 in all extremities. Sensation intact. Gait not checked.  PSYCHIATRIC: The patient is alert and oriented x 3.  SKIN: No obvious rash, lesion, or ulcer.    LABORATORY PANEL:   CBC Recent Labs  Lab 08/10/18 0256  WBC 13.7*  HGB 11.1*  HCT 36.2*  PLT 227   ------------------------------------------------------------------------------------------------------------------  Chemistries  Recent Labs  Lab 08/08/18 1231 08/09/18 0509 08/10/18 0256  NA 131* 136 138  K 3.2* 4.6 4.2  CL 95* 105 104  CO2 25 24 27   GLUCOSE 156* 189* 75  BUN 34* 30* 35*  CREATININE 1.26* 1.12 1.16  CALCIUM 8.8* 8.0*  8.3*  MG  --  2.1  --   AST 29  --   --   ALT 11  --   --   ALKPHOS 77  --   --   BILITOT 0.8  --   --     ------------------------------------------------------------------------------------------------------------------  Cardiac Enzymes No results for input(s): TROPONINI in the last 168 hours. ------------------------------------------------------------------------------------------------------------------  RADIOLOGY:  No results found.  EKG:   Orders placed or performed during the hospital encounter of 09/07/15  . ED EKG  . ED EKG  . EKG 12-Lead  . EKG 12-Lead  . EKG 12-Lead  . EKG 12-Lead  . EKG 12-Lead  . EKG 12-Lead  . EKG 12-Lead  . EKG 12-Lead  . EKG 12-Lead  . EKG 12-Lead  . EKG 12-Lead  . EKG 12-Lead  . EKG    ASSESSMENT AND PLAN:    Acute on chronic hypoxic respiratory failure secondary to COPD exacerbation: Patient on BiPAP.  Admit sats dropping to low 80s on nasal oxygen, able to wean off the BiPAP yet continue BiPAP, FiO2 down to 35% today.   2.  Multifocal pneumonia causing acute on chronic respiratory failure, continue BiPAP, decrease FiO2 as tolerated, continue IV antibiotics. 3.  Sepsis present on admission secondary to pneumonia: Evidence of cough respiratory rate, tachycardia, elevated white count, l  4 hypokalemia: Replaced.-Better. 5.  Diabetes mellitus type 2 ; continue sliding scale insulin with coverage, decrease Levemir to 12 units to prevent hypoglycemia's. #6 COPD exacerbation, no wheezing, continue bronchodilators, IV steroids, Support.  Patient is on theophylline.  Continue. 7.  Essential hypertension: Controlled. 8.  History of right lung cancer, has history of emphysema. Consult palliative care, discussed with [facility] from palliative care. All the records are reviewed and case discussed with Care Management/Social Workerr. Management plans discussed with the patient, family and they are in agreement.  CODE STATUS: DNR  TOTAL TIME TAKING CARE OF THIS PATIENT 38:minutes.   Possible discharge in next 1 to 2 days.  Patient is to be off the  BiPAP before working with physical therapy.Epifanio Lesches M.D on 08/10/2018 at 1:31 PM  Between 7am to 6pm - Pager - [phone]  After 6pm go to [email or web address] - password [email or web address] [facility]  Office  [phone]  CC: Primary care physician; [name], MD   Note: This dictation was prepared with Dragon dictation along with smaller phrase technology. Any transcriptional errors that result from this process are unintentional.

## 2018-08-10 NOTE — Consult Note (Signed)
Consultation Note Date: 08/10/2018   Patient Name: Earl Clay  DOB: [birth year]  MRN: [id number]  Age / Sex: 78 y.o., male  PCP: [name], MD Referring Physician: Epifanio Lesches, MD  Reason for Consultation: Establishing goals of care  HPI/Patient Profile: 78 y.o. male admitted on 08/08/2018 from home with complaints of worsening shortness of breath. He has a past medical history significant for coronary artery disease, diabetes, obstructive sleep apnea (CPAP), COPD (home oxygen 2-4L), PTSD, anxiety, depression, lung cancer x2 s/p chemoradiation therapy, hypertension, and GERD.  Patient presented to ED with complaints of cough, congestion, and purulent sputum production.  He complained of worsening shortness of breath for a week prior to admission.  During his ED course patient was started on BiPAP.  Chest x-ray showed bilateral patchy pulmonary opacities worrisome for bronchopneumonia or ill-defined nodules.  Small left pleural effusion.  Sodium 131, potassium 3.2, BUN 34, creatinine 1.26, WBC 20.7.  Since admission he continues on BiPAP with desaturation and tachycardia when attempted to wean, receiving IV antibiotics and steroids, and home medications. Palliative Medicine consulted for goals of care discussion.   Clinical Assessment and Goals of Care: I have reviewed medical records including lab results, imaging, Epic notes, and MAR, received report from the bedside RN, and assessed the patient. I met at the bedside with patient, his daughter Earl Clay, and Earl-term girlfriend Earl Clay  to discuss diagnosis prognosis, [place], EOL wishes, disposition and options.  He is alert and oriented x3.  He is able to engage in goals of care discussion.  Does complain of some shortness of breath while on BiPAP.  Denies pain.  I introduced Palliative Medicine as specialized medical care for people living with  serious illness. It focuses on providing relief from the symptoms and stress of a serious illness. The goal is to improve quality of life for both the patient and the family.  We discussed a brief life review of the patient.  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  He was also in the [employer] and served in the [place] War.  He has 2 daughters which are twins.  Unfortunately one of his daughters are not involved in his life as she is battling a drug addiction. Earl Clay is his support in addition to his girlfriend of more than 35 years, Earl Clay. He states he has 6 grandchildren who he loves dearly. His mother is also living and before admission was residing in the home with him at the age of 51.   As far as functional and nutritional status patient reports his appetite was fair depending on how he felt. He is on chronic oxygen which he states recently was increased to 4L/Earl Clay. He uses CPAP when napping and at night. Over the past month family reports he has been sleeping more and will generally take about 3-4 naps/day for about 1.5hrs. he states he is able to ambulate in the home with his oxygen but not Earl-distances due to easily becoming short of breath and fatigue.  He was driving up until about 6 weeks ago when he seemed like he began feeling bad. He is able to perform ADLs with frequent breaks. He uses a wheelchair when going out to doctors appointments.   Daughter is tearful and states "I know you have been struggling for some time dad, and I hate to see you like this." patient acknowledges her and states "I am tired, and if I live to see tomorrow I am thankful, but I know I am dying and when is the question."  Support given.   We discussed his current illness and what it means in the larger context of his on-going co-morbidities.  Natural disease trajectory and expectations at EOL were discussed. Patient verbalizes his concern that he can not come off BIPAP without distress  which is different from previous admissions and illness. He expresses his body is more tired than it has every been and he knows this is not good. He looks at me and says "everytime I go back to see my doctors they say they can't believe I am still here. I know I am on borrowed time, and when it is up I am ready!" support given.   I attempted to elicit values and goals of care important to the patient.    The difference between aggressive medical intervention and comfort care was considered in light of the patient's goals of care.  I allowed patient to express his goals of care for himself and at his request he prompted for his family to express how they felt. His daughter and Earl Clay both were tearful stating they did not want him to continue to suffer but also did not want to lose him. Patient holding daughters hand and expressed he loved her and just wanted to be comfortable.   We discussed options based on his expressed goals of comfort. Hospice and palliative care services outpatient were explained and offered. Family and patient verbalized understanding and appreciation. Patient expressed he wanted to be comfortable and not feel like he was suffering.   He confirmed wishes for DNR/DNI and no aggressive measures outside of current treatment. We discussed the use and difficulty of weaning from BiPAP. He verbalized he was ok to be on the machine but didn't like wearing and wouldn't want to wear it Earl-term. Support given. He is able to come off to eat and for short periods of time, however, his oxygen decreases and he becomes tachycardic.   Patient expresses that he is prepared for the inevitable and is ready to go to heaven when it is time. He states his daughter Earl Clay is his Scientist, research (medical) with support from [place]. They both agreed. He states he has already paid for his funeral arrangements and his family is aware of his wishes.   At this time patient is unsure if he wishes to proceed  with comfort or go home with family. Both he and his family do understand that hospice or palliative is available, and that  patient will require 24/7 care despite either option.   Patient and family has requested to discuss amongst themselves and asked to meet again for follow up on tomorrow morning at 10am.    Questions and concerns were addressed.  Hard Choices booklet left for review. The family was encouraged to call with questions or concerns.  PMT will continue to support holistically.  After leaving out daughter and girlfriend came out to nurses station and asked to speak privately. Support given. During conversation Earl Clay drove  most of the conversation, stating she had been through this with her mother. Daughter tearful and stated she did not want to see her father continue to suffer and felt that keeping him comfortable and allowing nature to take it's course without prolonging is what she would want for him. Earl Clay agreed. They inquired about his prognosis. I advised we did not have a crystal ball to say he had this amount of time left, however, to be honest with prognosis he was in a critical and guarded state. I re-discussed the oxygen demand, heart strain, and overall functional state he is currently in. I informed patient he could survive for days, weeks, or months but longest prognosis would be 3-6 months continuing in his current state which could be a stretch and that he is at high risk for cardiac arrest and death.  Daughter and girlfriend verbalized understanding. I advised them that patient was alert and oriented and would ultimately be the person who decides what he would want for himself with their support. I also advised that in the event he was unable to make decisions that daughter is the decision maker. Earl Clay verbalized understanding.   Primary Decision Maker:  PATIENT/DAUGHTER: Earl Clay     SUMMARY OF RECOMMENDATIONS    DNR/DNI-as confirmed by patient  Continue to  treat per attending   Patient expresses he understands his condition and he will pass away. Family requesting to have discussion amongst each other regarding patient's goals of care. Requesting follow-up meeting tomorrow 2/5 @ 1000 to discuss in detail with patient. Leaning towards comfort care versus home with hospice for EOL care.   Family upset that [place] came in and attempted to arrange home trilogy machine, immediately after discussions related to hospice. Feels as though they were being discharged and unsure of goals at this time.   Follow up with patient/daughter 2/5 @ 1000 for further goals of care discussion.   Earl Clay, Case Manager aware of pending goals palliative versus hospice.   PMT will continue to support patient, family, and medical team.   Code Status/Advance Care Planning:  DNR/DNI    Symptom Management:   Per attending/CCM   Palliative Prophylaxis:   Aspiration, Bowel Regimen, Delirium Protocol, Frequent Pain Assessment and Oral Care  Additional Recommendations (Limitations, Scope, Preferences):  Full Scope Treatment, no aggressive measures or escalation of care.   Psycho-social/Spiritual:   Desire for further Chaplaincy support:YES   Additional Recommendations: Education on Hospice  Prognosis:   POOR in the setting of acute on chronic respiratory failure with hypoxemia, pneumonia, COPD, CAD, depression, diabetes, lung cancer, oxygen dependent 4L, PTSD, sleep apnea, CPAP, and anxiety.   Discharge Planning: To Be Determined      Primary Diagnoses: Present on Admission: . Acute on chronic respiratory failure with hypoxemia ([place])   I have reviewed the medical record, interviewed the patient and family, and examined the patient. The following aspects are pertinent.  Past Medical History:  Diagnosis Date  . Asthma   . CAD (coronary artery disease)   . COPD (chronic obstructive pulmonary disease) ([place])   . Depression   . Diabetes mellitus without  complication ([place])   . Difficult intubation   . GERD (gastroesophageal reflux disease)   . Hypertension   . Lung cancer ([place])   . 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 Pancreatic mass   . PTSD (post-traumatic stress disorder)   . Sleep apnea    CPAP with O2 2L/m at night   Social History  Socioeconomic History  . Marital status: Single    Spouse name: Not on file  . Number of children: Not on file  . Years of education: Not on file  . Highest education level: Not on file  Occupational History  . Not on file  Social Needs  . Financial resource strain: Not on file  . Food insecurity:    Worry: Not on file    Inability: Not on file  . Transportation needs:    Medical: Not on file    Non-medical: Not on file  Tobacco Use  . Smoking status: Former Research scientist (life sciences)  . Smokeless tobacco: Never Used  Substance and Sexual Activity  . Alcohol use: Not on file  . Drug use: Not on file  . Sexual activity: Not on file  Lifestyle  . Physical activity:    Days per week: Not on file    Minutes per session: Not on file  . Stress: Not on file  Relationships  . Social connections:    Talks on phone: Not on file    Gets together: Not on file    Attends religious service: Not on file    Active member of club or organization: Not on file    Attends meetings of clubs or organizations: Not on file    Relationship status: Not on file  Other Topics Concern  . Not on file  Social History Narrative  . Not on file   Family History  Problem Relation Age of Onset  . Hypertension Mother   . Congestive Heart Failure Father    Scheduled Meds: . albuterol  3 mL Nebulization BID  . buPROPion  100 mg Oral BID  . chlorhexidine  15 mL Mouth Rinse BID  . citalopram  20 mg Oral Daily  . diltiazem  180 mg Oral Daily  . enalapril  5 mg Oral Daily  . enoxaparin (LOVENOX) injection  40 mg Subcutaneous Q24H  . furosemide  20 mg Oral Daily  . hydrochlorothiazide  25 mg Oral Daily  . hydrOXYzine   10 mg Oral QHS  . insulin aspart  0-9 Units Subcutaneous TID WC  . [START ON 08/11/2018] insulin detemir  12 Units Subcutaneous Daily  . LORazepam  0.5 mg Oral QHS  . magnesium oxide  400 mg Oral Daily  . mouth rinse  15 mL Mouth Rinse q12n4p  . methylPREDNISolone (SOLU-MEDROL) injection  60 mg Intravenous Q24H  . PARoxetine  40 mg Oral BID  . pravastatin  80 mg Oral QHS  . sodium chloride flush  3 mL Intravenous Once  . tamsulosin  0.4 mg Oral QHS  . theophylline  300 mg Oral BID  . tiZANidine  4 mg Oral QHS   Continuous Infusions: . azithromycin    . cefTRIAXone (ROCEPHIN)  IV Stopped (08/09/18 1846)   PRN Meds:.acetaminophen **OR** acetaminophen, albuterol, ondansetron **OR** ondansetron (ZOFRAN) IV, polyethylene glycol Medications Prior to Admission:  Prior to Admission medications   Medication Sig Start Date End Date Taking? Authorizing Provider  albuterol (PROVENTIL HFA;VENTOLIN HFA) 108 (90 Base) MCG/ACT inhaler Inhale 2 puffs into the lungs 2 (two) times daily.    Yes [provider]  aspirin 81 MG chewable tablet Chew 81 mg by mouth daily.   Yes [provider]  buPROPion (WELLBUTRIN) 100 MG tablet Take 100 mg by mouth 2 (two) times daily.   Yes [provider]  cetirizine (ZYRTEC) 10 MG tablet Take 10 mg by mouth every evening.    Yes [provider]  citalopram (CELEXA) 20 MG tablet Take 20 mg by mouth daily.    Yes [provider]  diltiazem (DILACOR XR) 180 MG 24 hr capsule Take 180 mg by mouth daily.   Yes [provider]  enalapril (VASOTEC) 5 MG tablet Take 5 mg by mouth daily.    Yes [provider]  furosemide (LASIX) 20 MG tablet Take 20 mg by mouth daily.    Yes [provider]  hydrochlorothiazide (HYDRODIURIL) 25 MG tablet Take 25 mg by mouth daily.    Yes [provider]  hydrOXYzine (ATARAX/VISTARIL) 10 MG tablet Take 10 mg by mouth at bedtime.   Yes [provider]  insulin  aspart (NOVOLOG) 100 UNIT/ML injection Inject 10-20 Units into the skin 3 (three) times daily with meals as needed for high blood sugar. Pt uses as needed per sliding scale.    Yes [provider]  insulin detemir (LEVEMIR) 100 UNIT/ML injection Inject 20 Units into the skin daily.    Yes [provider]  LORazepam (ATIVAN) 0.5 MG tablet Take 0.5 mg by mouth at bedtime.  02/08/17  Yes [provider]  magnesium oxide (MAG-OX) 400 MG tablet Take 400 mg by mouth daily.    Yes [provider]  PARoxetine (PAXIL) 40 MG tablet Take 40 mg by mouth 2 (two) times daily.    Yes [provider]  pravastatin (PRAVACHOL) 80 MG tablet Take 80 mg by mouth at bedtime.    Yes [provider]  sildenafil (VIAGRA) 100 MG tablet Take 100 mg by mouth daily as needed for erectile dysfunction.    Yes [provider]  tamsulosin (FLOMAX) 0.4 MG CAPS capsule Take 0.4 mg by mouth at bedtime.   Yes [provider]  theophylline (THEO-24) 300 MG 24 hr capsule Take 300 mg by mouth 2 (two) times daily.    Yes [provider]  tiotropium (SPIRIVA) 18 MCG inhalation capsule Place 18 mcg into inhaler and inhale daily.    Yes [provider]  tiZANidine (ZANAFLEX) 4 MG tablet Take 4 mg by mouth at bedtime.   Yes [provider]  traZODone (DESYREL) 100 MG tablet Take 100 mg by mouth at bedtime.   Yes [provider]   Allergies  Allergen Reactions  . Ampicillin Hives and Other (See Comments)    Has patient had a PCN reaction causing immediate rash, facial/tongue/throat swelling, SOB or lightheadedness with hypotension: No Has patient had a PCN reaction causing severe rash involving mucus membranes or skin necrosis: No Has patient had a PCN reaction that required hospitalization No Has patient had a PCN reaction occurring within the last 10 years: No If all of the above answers are "NO", then may proceed with Cephalosporin  use.  Marland Kitchen Penicillins Hives and Other (See Comments)    Has patient had a PCN reaction causing immediate rash, facial/tongue/throat swelling, SOB or lightheadedness with hypotension: No Has patient had a PCN reaction causing severe rash involving mucus membranes or skin necrosis: No Has patient had a PCN reaction that required hospitalization No Has patient had a PCN reaction occurring within the last 10 years: No If all of the above answers are "NO", then may proceed with Cephalosporin use.  . Zyban [Bupropion] Rash   Review of Systems  Constitutional: Positive for activity change, appetite change and fatigue.  Respiratory: Positive for cough and shortness of breath.   Cardiovascular: Positive for leg swelling.  Neurological: Positive for weakness.  All other systems reviewed and are negative.   Physical Exam Vitals signs and nursing note reviewed.  Constitutional:      General: He is awake.     Appearance: He is well-developed. He is ill-appearing.     Comments: Chronically ill appearing,   Cardiovascular:     Rate and Rhythm: Tachycardia present.     Pulses: Normal pulses.     Heart sounds: Normal heart sounds.  Pulmonary:     Effort: Tachypnea present.     Breath sounds: Wheezing and rhonchi present.     Comments: Shortness of breath  Abdominal:     General: Bowel sounds are normal.     Palpations: Abdomen is soft.  Skin:    General: Skin is warm and dry.     Findings: Bruising present.  Neurological:     Mental Status: He is alert and oriented to person, place, and time.     Motor: Weakness present.  Psychiatric:        Attention and Perception: Attention normal.        Mood and Affect: Mood normal.        Speech: Speech normal.        Behavior: Behavior is cooperative.        Thought Content: Thought content normal.        Cognition and Memory: Cognition normal.        Judgment: Judgment normal.     Vital Signs: BP 133/66 (BP Location: Right Arm)   Pulse (!)  114   Temp 98.2 F (36.8 C) (Oral)   Resp (!) 21   Ht '5\' 6"'$  (1.676 m)   Wt 71.4 kg   SpO2 95%   BMI 25.41 kg/m  Pain Scale: 0-10   Pain Score: 0-No pain   SpO2: SpO2: 95 % O2 Device:SpO2: 95 % O2 Flow Rate: .O2 Flow Rate (L/min): 4 L/min  IO: Intake/output summary:   Intake/Output Summary (Last 24 hours) at 08/10/2018 1222 Last data filed at 08/10/2018 1004 Gross per 24 hour  Intake 340.76 ml  Output 675 ml  Net -334.24 ml    LBM: Last BM Date: 08/08/18 Baseline Weight: Weight: 72.6 kg Most recent weight: Weight: 71.4 kg     Palliative Assessment/Data: PPS 30%   Flowsheet Rows     Most Recent Value  Intake Tab  Referral Department  Critical care  Unit at Time of Referral  ICU  Palliative Care Primary Diagnosis  Pulmonary  Date Notified  08/09/18  Palliative Care Type  New Palliative care  Reason for referral  Clarify Goals of Care  Date of Admission  08/08/18  # of days IP prior to Palliative referral  1  Clinical Assessment  Psychosocial & Spiritual Assessment  Palliative Care Outcomes      Time In: 1100 Time Out: 1230 Time Total: 90 min  Greater than 50%  of this time was spent counseling and coordinating care related to the above assessment and plan.  Signed by:  Alda Lea, AGPCNP-BC Palliative Medicine Team  Phone: [phone] Fax: [phone] Pager: [phone] Amion: Bjorn Pippin    Please contact Palliative Medicine Team phone at [phone] for questions and concerns.  For individual provider: See Shea Evans

## 2018-08-10 NOTE — Progress Notes (Signed)
Chaplain encouraged distraught granddaughter in hallway. Earl Clay reports he asked her to leave as she was cause him to struggle to breath. Upon arrival Earl Clay was calmer and appreciated prayer and supportive presence. He is making meaning of his decline and desires to live with comfort and peace.     08/10/18 1800  Clinical Encounter Type  Visited With Patient and family together  Visit Type Initial

## 2018-08-10 NOTE — Progress Notes (Signed)
CRITICAL CARE NOTE  CC  follow up respiratory failure, COPD exacerbation on BIPAP, acute hypoxemia on chronic hypoxemia  SUBJECTIVE Patient remains critically ill Prognosis is guarded -s/p eval by palliative care - family was upset after discussion with palliative team , will ask them to come back to clarify discussion.      SIGNIFICANT EVENTS -improved on BIPAP   BP 133/66 (BP Location: Right Arm)   Pulse (!) 114   Temp 98.2 F (36.8 C) (Oral)   Resp (!) 21   Ht 5\' 6"  (1.676 m)   Wt 71.4 kg   SpO2 95%   BMI 25.41 kg/m    REVIEW OF SYSTEMS  PATIENT IS UNABLE TO PROVIDE COMPLETE REVIEW OF SYSTEMS DUE TO SEVERE CRITICAL ILLNESS on BIPAP  PHYSICAL EXAMINATION:  GENERAL:critically ill appearing, +resp distress HEAD: Normocephalic, atraumatic.  EYES: Pupils equal, round, reactive to light.  No scleral icterus.  MOUTH: Moist mucosal membrane. NECK: Supple. No thyromegaly. No nodules. No JVD.  PULMONARY: +rhonchi, +wheezing CARDIOVASCULAR: S1 and S2. Regular rate and rhythm. No murmurs, rubs, or gallops.  GASTROINTESTINAL: Soft, nontender, -distended. No masses. Positive bowel sounds. No hepatosplenomegaly.  MUSCULOSKELETAL: No swelling, clubbing, or edema.  NEUROLOGIC: obtunded, GCS<12 SKIN:intact,warm,dry      Indwelling Urinary Catheter continued, requirement due to   Reason to continue Indwelling Urinary Catheter for strict Intake/Output monitoring for hemodynamic instability   Central Line continued, requirement due to   Reason to continue [employer] Monitoring of central venous pressure or other hemodynamic parameters   Ventilator continued, requirement due to, resp failure    Ventilator Sedation RASS 0 to -2     ASSESSMENT AND PLAN SYNOPSIS   Severe Hypoxic and Hypercapnic Respiratory Failure -continue BiPAP  -COPD care path with antibiotics/prednisone -Bronchopulmonary hygiene, therapy incentive spirometer at bedside -History of lung CA,  on home O2 -s/p eval by Palliative - family confused regarding discussion    Community aquired Pneumonia Continue Rocephin and Zithromax -Complicated by severe COPD/centrilobular emphysema and previous lung CA   Acute renal insufficiency- KDIGO1 -Appears to be prerenal improved with hydration -follow chem 7 -follow UO -continue Foley Catheter-assess need daily   NEUROLOGY -Patient improved post BiPAP   CARDIAC ICU monitoring  ID -continue IV abx as prescibed -follow up cultures  GI/Nutrition GI PROPHYLAXIS as indicated DIET-->TF's as tolerated Constipation protocol as indicated  ENDO - ICU hypoglycemic\Hyperglycemia protocol -check FSBS per protocol   ELECTROLYTES -follow labs as needed -replace as needed -pharmacy consultation and following   DVT/GI PRX ordered TRANSFUSIONS AS NEEDED MONITOR FSBS ASSESS the need for LABS as needed   Critical Care Time devoted to patient care services described in this note is 32 minutes.     Overall, patient is critically ill, prognosis is guarded.  Patient with Multiorgan failure and at high risk for cardiac arrest and death.     Ottie Glazier, M.D.  Pulmonary & Critical Care Medicine

## 2018-08-11 LAB — BLOOD GAS, ARTERIAL
ACID-BASE EXCESS: 1.1 mmol/L (ref 0.0–2.0)
Bicarbonate: 25.1 mmol/L (ref 20.0–28.0)
Delivery systems: POSITIVE
Expiratory PAP: 7
FIO2: 0.35
Inspiratory PAP: 14
O2 Saturation: 91.9 %
Patient temperature: 37
RATE: 14 resp/min
pCO2 arterial: 37 mmHg (ref 32.0–48.0)
pH, Arterial: 7.44 (ref 7.350–7.450)
pO2, Arterial: 61 mmHg — ABNORMAL LOW (ref 83.0–108.0)

## 2018-08-11 LAB — GLUCOSE, CAPILLARY
Glucose-Capillary: 178 mg/dL — ABNORMAL HIGH (ref 70–99)
Glucose-Capillary: 111 mg/dL — ABNORMAL HIGH (ref 70–99)
Glucose-Capillary: 244 mg/dL — ABNORMAL HIGH (ref 70–99)
Glucose-Capillary: 126 mg/dL — ABNORMAL HIGH (ref 70–99)

## 2018-08-11 LAB — PHOSPHORUS: Phosphorus: 2.7 mg/dL (ref 2.5–4.6)

## 2018-08-11 MED ORDER — ACETYLCYSTEINE 20 % IN SOLN
4.0000 mL | RESPIRATORY_TRACT | Status: DC
  Administered 2018-08-11 – 2018-08-12 (×4): 4 mL via RESPIRATORY_TRACT
  Filled 2018-08-11 (×10): qty 4

## 2018-08-11 MED ORDER — SODIUM PHOSPHATES 45 MMOLE/15ML IV SOLN
20.0000 mmol | Freq: Once | INTRAVENOUS | Status: AC
  Administered 2018-08-11: 20 mmol via INTRAVENOUS
  Filled 2018-08-11: qty 6.67

## 2018-08-11 NOTE — Care Management (Cosign Needed)
Severe COPD and end stage emphysema; chronic respiratory failure with hypoxemia.  Patient continues to exhibit signs of hypercapnia associated with chronic respiratory failure secondary to severe COPD.  Patient requires the use of NIV both QHS and daytime to help with exacerbation periods.  The use of NIV will treat the patient's high PCO2 levels and can reduce risk of exacerbations and future hospitalizations when used at night and during the day.  Patient will need these advanced settings in conjunction with his current medication regimen; BIPAP with backup rate has been tried and failed.  Failure to have NIV available for use over a 24 hr period could lead to death.

## 2018-08-11 NOTE — Progress Notes (Signed)
CRITICAL CARE NOTE  CC  follow up respiratory failure, lung ca, CAP  SUBJECTIVE Patient remains critically ill, intermittent desaturation overnight Prognosis is guarded Goal of care discussion this am , patient still unsure if he wants comfort measures     SIGNIFICANT EVENTS -intermittent desaturation, tripoding due to breathlessness   BP (!) 141/73 (BP Location: Right Arm)   Pulse (!) 101   Temp (!) 97.2 F (36.2 C) (Axillary)   Resp (!) 23   Ht 5\' 6"  (1.676 m)   Wt 71.4 kg   SpO2 93%   BMI 25.41 kg/m    REVIEW OF SYSTEMS  Review of Systems  Constitutional: Negative for fever and weight loss.  HENT: Negative for congestion and hearing loss.   Eyes: Negative for blurred vision.  Respiratory: Positive for cough, shortness of breath and wheezing.   Cardiovascular: Positive for orthopnea. Negative for chest pain.  Gastrointestinal: Negative for vomiting.  Genitourinary: Negative for urgency.  Musculoskeletal: Negative for neck pain.  Skin: Negative for rash.  Neurological: Positive for dizziness and weakness. Negative for tingling.  Endo/Heme/Allergies: Does not bruise/bleed easily.  Psychiatric/Behavioral: Negative for suicidal ideas. The patient is not nervous/anxious.      PHYSICAL EXAMINATION:  GENERAL:critically ill appearing, +resp distress HEAD: Normocephalic, atraumatic.  EYES: Pupils equal, round, reactive to light.  No scleral icterus.  MOUTH: Moist mucosal membrane. NECK: Supple. No thyromegaly. No nodules. No JVD.  PULMONARY: +rhonchi bilaterally  CARDIOVASCULAR: S1 and S2. Regular rate and rhythm. No murmurs, rubs, or gallops.  GASTROINTESTINAL: Soft, nontender, -distended. No masses. Positive bowel sounds. No hepatosplenomegaly.  MUSCULOSKELETAL: No swelling, clubbing, or edema.  NEUROLOGIC: obtunded, GCS<12 SKIN:intact,warm,dry      Indwelling Urinary Catheter continued, requirement due to   Reason to continue Indwelling Urinary Catheter  for strict Intake/Output monitoring for hemodynamic instability   Central Line continued, requirement due to   Reason to continue [employer] Monitoring of central venous pressure or other hemodynamic parameters   Ventilator continued, requirement due to, resp failure    Ventilator Sedation RASS 0 to -2     ASSESSMENT AND PLAN SYNOPSIS   Severe Hypoxic and Hypercapnic Respiratory Failure -continueBiPAP  -will start VEST and mucomyst 10% to help with expectoration of thickned phlegm -COPD care path with antibiotics/prednisone -Bronchopulmonary hygiene,therapy incentive spirometer at bedside -History of lung CA,on home O2 -s/p eval by Palliative - discussed with NP, she was kind enough to come back and speak to family again, patient expresses fear of death on comfort measures   Community aquired Pneumonia Continue Rocephin and Zithromax -Complicated by severe COPD/centrilobular emphysema and previous lung CA   Acute renal insufficiency- KDIGO1 -resolved -Appears to be prerenal improved with hydration -follow chem 7 -follow UO -continue Foley Catheter-assess need daily   NEUROLOGY -Patient improved post BiPAP   CARDIAC ICU monitoring  ID -continue IV abx as prescibed -follow up cultures  GI/Nutrition GI PROPHYLAXIS as indicated DIET-->TF's as tolerated Constipation protocol as indicated  ENDO - ICU hypoglycemic\Hyperglycemia protocol -check FSBS per protocol   ELECTROLYTES -hypophosphatemia - repleting  -repeat phos in 4h   DVT/GI PRX ordered TRANSFUSIONS AS NEEDED MONITOR FSBS ASSESS the need for LABS as needed   Critical Care Time devoted to patient care services described in this note is81minutes.     Overall, patient is critically ill, prognosis is guarded. Patient with Multiorgan failure and at high risk for cardiac arrest and death.     Ottie Glazier, M.D.  Pulmonary & Critical Care  Medicine

## 2018-08-11 NOTE — Progress Notes (Signed)
New referral for outpatient Palliative to follow at home received from [facility]. Patient will be followed by [facility]. No discharge date at this time. Patient information faxed to referral.  Flo Shanks RN, BSN, [facility] [place], hospital liaison [phone]

## 2018-08-11 NOTE — Progress Notes (Signed)
PALLIATIVE NOTE:  Received a call from [place], RN expressing patient was needing medications for anxiety and also upset due to confusion in goals of care discussion and further planning.   I spoke with Earl Clay, Case Manager and also Dr. Loni Clay in regards to goals of care, previous conversations with patient, and also potential resources.   I met at bedside with patient, long-term girlfriend Earl Clay), and daughter Earl Clay. Patient was on nasal cannula during visit, allowing him to eat dinner. He is tolerating ok, with occasional noted drop in oxygen saturation and tachycardia. Patient states he feels as if he is suffocating at times or light-headed if he tries to take deep breaths rapidly. Support given. Encourage patient when he feels short of breath to take slow breaths with pursed lips.   Discussed with patient and family concerns regarding previous goals of care. Earl Clay, significant other states she was confused because she felt that patient was not getting much better but felt others thought that he was getting better and not appropriate for EOL care. Support given. She requested to talk outside of the room. Advised I could listen to her concerns outside the room however, ultimately patient is capable of verbalizing and expressing his goals and concerns. I also reminded her in the event he was not able to express needs that his daughter Earl Clay, is his HCPOA. Earl Clay verbalized understanding.   At the bedside I re-discussed patient's current condition and co-morbidities. Patient is aware that he can have home use of trilogy (BiPAP) machine for support. He states he is ok with the machine however, if it becomes more of a requirement he would not be interested as he doesn't want to be on it all the time. He expresses he is anxious at times which doesn't help his breathing. He verbalizes he knows he is not doing the best however, if there is a chance he can get home this would me what he wanted. He reports once  he is there if he doesn't do well and/or passes away he would be comfortable with this. Support given. Daughter verbalized understanding and appreciation. She expressed that she felt the confusion was Earl Clay saying patient was dying right now and didn't have long to live and why was others making it seem as though he would be living for a long time. Again expressed in detail patient prognosis is guarded and EOL care should be considered however, if patient is not ready to transition to more of a comfort focus he has this right and regardless of prognosis the medical team will continue to support and provide them with all available options based on his said wishes and goals. Daughter verbalized understanding and appreciation as well as patient.   At this time Earl Clay expressed his appreciation of and expressed he would like some time to think about all available options as we discussed (home with hospice (nasal cannula and current CPAP) and comfort measures, home with palliative (BiPAP machine and nasal cannula as needed), initiate comfort measures while hospitalized with possible transfer to residential hospice facility.   Also educated family on risk of PRN morphine and ativan such as lethargy, respiratory depression, or decreased heart rate. Patient and daughter verbalized understanding. Patient expressed he would prefer to have medication for anxiety and to help ease his work of breath. Support given.   PRN morphine for air hunger and PRN ativan for anxiety/agitation ordered.   Time In: 1615 Time Out: 1705 Total Time: 50 min.   Greater than 50%  of this time was spent counseling and coordinating care related to the above assessment and plan   Alda Lea, AGPCNP-BC Palliative Medicine Team  Phone: [phone] Fax: [phone] Pager: [phone] Amion: N. Cousar

## 2018-08-11 NOTE — Progress Notes (Signed)
Daily Progress Note   Patient Name: Earl Clay       Date: 08/11/2018 DOB: [birth year]  Age: 78 y.o. MRN#: [id number] Attending Physician: Earl Lesches, MD Primary Care Physician: Earl Crouch, MD Admit Date: 08/08/2018  Reason for Consultation/Follow-up: Establishing goals of care, Non pain symptom management, Pain control and Psychosocial/spiritual support  Subjective: Patient sitting on side of bed with arms propped on bedside table. Currently tolerating nasal cannula with occasional desaturations. He does become short of breath when talking and noted desaturations in the 80's (87). Denies pain. Does state he feels that the PRN ativan and morphine helps some, although he only slept for about an hour throughout the night due to continuous feelings of shortness of breath or difficulty getting comfortable and repositioned. Daughter stayed overnight with him, and expressed he seemed more relax and comfortable with his breathing. Reports eating a good breakfast for once.   We spent a detailed amount of time reviewing our goals of care discussion on yesterday and allowing patient to express his wishes and feelings. Earl Clay stated "I am tired, but I want to go home and at least try this machine. If it helps me it helps me if it doesn't it doesn't. I am ready to go to heaven either way, but we will just see. It is not a matter of if I am going it is when is he going to take me!" support given. I discussed with patient, based on his statement he is expressing he would like to go home with BiPAP and see how he does. Patient verbalized agreement in statement. Daughter confirms and states he told her the same prior to arrival. Patient expresses again he knows he is dying and he doesn't  want any one to think he isn't realistic and he doesn't want anyone coming in the room sugar coating that he is going to be ok or live another year or so because he feels he will be lucky to see next month. Support given.   I explained to him with the BiPAP machine he would not qualify for hospice however, we could still arrange for outpatient palliative and at any point he can transition to hospice and home care can remove BiPAP machine. He and daughter verbalized understanding. Patient expressed concerns with ability to get in and  out of his bed and have a way to sit upright and not feel that he is suffocating in his normal bed at home and would like to have a hospital bed for safety, and better positioning to facilitate good oxygenation. Support given and advised I would place referral and case management would look into set-up and insurance. Patient and daughter verbalized understanding.   I discussed with daughter importance of patient having family support and 24/7 assistance due to the criticalness of his condition and deconditioning. She verbalized understanding. Patient inquiring about possible home health through the [place] as he once had a CNA coming out assisting before. Advised I would discuss with case manager also and see if this was an option, however, generally this is consider private pay for CNA assistance. Family verbalized understanding and daughter expressed she is willing to support and pull all family together to support him as needed.   Discussed code status and documenting his wishes for care in the home via MOST form. Patient verbalized agreement. Reviewed MOST form with patient and daughter in detail and all questions were answered. Significant other arrived and engaged in conversation also. Patient completed MOST form him self as we reviewed each section again he checked the appropriate box based on his wishes. Family and I then confirmed boxes he checked. Patient chose to indicate  DNR, Comfort Measures, no IV fluids, antibiotics, or PEG tube. He expressed once he got home he did not want to "see this place again". He stated he did not want to come back to the hospital much less ICU. He wanted to be kept comfortable and if that meant with hospice, he was at peace with that. He does not want any further aggressive measures and his wishes are to treat the current and get him home.   He expressed he is hopeful to get home in the next few days in his own environment amongst family and friends. I explained it would be up to his attending provider and they felt his current treatment plan was stable for him to return home. Patient and family verbalized understanding.   Patient expressed his appreciation of my involvement and support over the past couple days. He and family is aware I or my colleagues will continue to support and follow while hospitalized.   Length of Stay: 3  Current Medications: Scheduled Meds:  . acetylcysteine  4 mL Nebulization Q4H  . albuterol  3 mL Nebulization BID  . buPROPion  100 mg Oral BID  . chlorhexidine  15 mL Mouth Rinse BID  . citalopram  20 mg Oral Daily  . diltiazem  180 mg Oral Daily  . enalapril  5 mg Oral Daily  . enoxaparin (LOVENOX) injection  40 mg Subcutaneous Q24H  . furosemide  20 mg Oral Daily  . hydrochlorothiazide  25 mg Oral Daily  . hydrOXYzine  10 mg Oral QHS  . insulin aspart  0-9 Units Subcutaneous TID WC  . insulin detemir  12 Units Subcutaneous Daily  . LORazepam  0.5 mg Oral QHS  . magnesium oxide  400 mg Oral Daily  . mouth rinse  15 mL Mouth Rinse q12n4p  . methylPREDNISolone (SOLU-MEDROL) injection  60 mg Intravenous Q24H  . PARoxetine  40 mg Oral BID  . pravastatin  80 mg Oral QHS  . tamsulosin  0.4 mg Oral QHS  . theophylline  300 mg Oral BID  . tiZANidine  4 mg Oral QHS    Continuous Infusions: . azithromycin 500  mg (08/10/18 1812)  . cefTRIAXone (ROCEPHIN)  IV 1 g (08/10/18 1804)  . sodium phosphate   Dextrose 5% IVPB 20 mmol (08/11/18 1101)    PRN Meds: acetaminophen **OR** acetaminophen, albuterol, LORazepam, morphine injection, ondansetron **OR** ondansetron (ZOFRAN) IV, polyethylene glycol  Physical Exam Vitals signs and nursing note reviewed.  Constitutional:      General: He is awake.     Appearance: He is well-developed. He is ill-appearing.     Comments: Chronically ill appearing   Cardiovascular:     Rate and Rhythm: Tachycardia present.     Pulses: Normal pulses.     Heart sounds: Normal heart sounds.  Pulmonary:     Effort: Accessory muscle usage present.     Breath sounds: Wheezing and rhonchi present.     Comments: Shortness of breath, dyspnea with minimum exertion or talking  Abdominal:     General: Bowel sounds are normal.     Palpations: Abdomen is soft.  Skin:    General: Skin is warm and dry.     Findings: Bruising present.  Neurological:     Mental Status: He is alert and oriented to person, place, and time.  Psychiatric:        Behavior: Behavior is cooperative.        Cognition and Memory: Cognition normal.        Judgment: Judgment normal.             Vital Signs: BP 117/61   Pulse (!) 109   Temp 98.4 F (36.9 C) (Oral)   Resp (!) 34   Ht 5\' 6"  (1.676 m)   Wt 71.4 kg   SpO2 (!) 87%   BMI 25.41 kg/m  SpO2: SpO2: (!) 87 % O2 Device: O2 Device: Bi-PAP O2 Flow Rate: O2 Flow Rate (L/min): 4 L/min  Intake/output summary:   Intake/Output Summary (Last 24 hours) at 08/11/2018 1427 Last data filed at 08/11/2018 1041 Gross per 24 hour  Intake 360 ml  Output 1625 ml  Net -1265 ml   LBM: Last BM Date: 08/08/18 Baseline Weight: Weight: 72.6 kg Most recent weight: Weight: 71.4 kg      Palliative Assessment/Data: PPS 30%   Flowsheet Rows     Most Recent Value  Intake Tab  Referral Department  Critical care  Unit at Time of Referral  ICU  Palliative Care Primary Diagnosis  Pulmonary  Date Notified  08/09/18  Palliative Care Type  New  Palliative care  Reason for referral  Clarify Goals of Care  Date of Admission  08/08/18  # of days IP prior to Palliative referral  1  Clinical Assessment  Psychosocial & Spiritual Assessment  Palliative Care Outcomes      Patient Active Problem List   Diagnosis Date Noted  . Acute on chronic respiratory failure with hypoxemia ([place]) 08/08/2018  . Lung nodule, solitary 08/15/2016  . 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 Malnutrition of moderate degree 09/09/2015  . COPD exacerbation ([place]) 09/09/2015  . ARF (acute renal failure) ([place]) 09/07/2015  . Type 2 diabetes mellitus ([place]) 07/20/2015  . Type 2 diabetes mellitus with hyperglycemia ([place]) 05/16/2014  . History of cardiac catheterization 01/24/2014  . Breathlessness on exertion 01/24/2014  . Chronic obstructive bronchitis ([place]) 11/17/2013  . CAD in native artery 11/17/2013  . Benign essential HTN 11/17/2013  . Combined fat and carbohydrate induced hyperlipemia 11/17/2013  . Hypersomnia with sleep apnea 11/17/2013  . Panlobular emphysema ([place]) 11/17/2013  .  Pure hypercholesterolemia 11/17/2013  . 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 Elevated prostate specific antigen (PSA) 08/18/2012  . Disorder of bladder function 08/18/2012  . Enlarged prostate with lower urinary tract symptoms (LUTS) 08/18/2012  . Incomplete bladder emptying 08/18/2012  . Benign prostatic hyperplasia with urinary obstruction 08/18/2012  . FOM (frequency of micturition) 08/18/2012    Palliative Care Assessment & Plan   Patient Profile: 78 y.o. male admitted on 08/08/2018 from home with complaints of worsening shortness of breath. He has a past medical history significant for coronary artery disease, diabetes, obstructive sleep apnea (CPAP), COPD (home oxygen 2-4L), PTSD, anxiety, depression, lung cancer x2 s/p chemoradiation therapy, hypertension, and GERD.  Patient presented to ED with complaints of cough, congestion, and purulent  sputum production.  He complained of worsening shortness of breath for a week prior to admission.  During his ED course patient was started on BiPAP.  Chest x-ray showed bilateral patchy pulmonary opacities worrisome for bronchopneumonia or ill-defined nodules.  Small left pleural effusion.  Sodium 131, potassium 3.2, BUN 34, creatinine 1.26, WBC 20.7.  Since admission he continues on BiPAP with desaturation and tachycardia when attempted to wean, receiving IV antibiotics and steroids, and home medications. Palliative Medicine consulted for goals of care discussion.   Recommendations/Plan:  DNR/DNI  MOST form and out of facility DNR form completed and placed on chart to be given at discharge  Patient and family has expressed goals are to return home with trial use of BIPAP and oxygen nasal cannula as needed. Outpatient palliative, however, all is aware he may transition to hospice at anytime once no longer wishes to continue usage of BIPAP.   Case Manager referral for outpatient palliative and hospital bed.   Morphine PRN for pain/air hunger (will need RX for this at discharge)   Ativan PRN for anxiety/agitation (will need RX for this at discharge)  PMT will continue to support and follow.   Goals of Care and Additional Recommendations:  Limitations on Scope of Treatment: Avoid Hospitalization, Full Scope Treatment and No Artificial Feeding  Code Status:    Code Status Orders  (From admission, onward)         Start     Ordered   08/08/18 1643  Do not attempt resuscitation (DNR)  Continuous    Question Answer Comment  In the event of cardiac or respiratory ARREST Do not call a "code blue"   In the event of cardiac or respiratory ARREST Do not perform Intubation, CPR, defibrillation or ACLS   In the event of cardiac or respiratory ARREST Use medication by any route, position, wound care, and other measures to relive pain and suffering. May use oxygen, suction and manual treatment of  airway obstruction as needed for comfort.      08/08/18 1642        Code Status History    Date Active Date Inactive Code Status Order ID Comments User Context   09/07/2015 1741 09/09/2015 1717 Full Code [id number]  Hillary Bow, MD ED     Prognosis:   < 6 months  Discharge Planning:  Home with BiPAP and outpatient palliative   Care plan was discussed with patient, patient's family, bedside RN, and Dr. Lanney Gins.   Thank you for allowing the Palliative Medicine Team to assist in the care of this patient.   Time In: 1000 Time Out: 1115 Total Time 75 min.  Prolonged Time Billed  Yes      Greater than 50%  of this time was  spent counseling and coordinating care related to the above assessment and plan.  Alda Lea, AGPCNP-BC Palliative Medicine Team  Pager: [phone] Amion: Bjorn Pippin   Please contact Palliative Medicine Team phone at [phone] for questions and concerns.

## 2018-08-11 NOTE — Care Management Note (Signed)
Case Management Note  Patient Details  Name: Earl Earl MRN: [id number] Date of Birth: 07/12/1940  Subjective/Objective:      Referral to Earl Earl with Earl Earl for Earl bed and Bipap/NIV.  Previous RNCM made referral for [facility] services to Earl Earl.  Patient does have a walker at home.  He obtains his medications through the [place] and [facility].  No difficulties obtaining medications or with accessing medical care.  His friend Earl Earl is at bedside and takes him to all appointments and checks on him daily.   Will continue to follow through discharge.              Action/Plan:   Expected Discharge Date:  08/11/18               Expected Discharge Plan:  Earl Earl  In-House Referral:     Discharge planning Services  CM Consult  Post Acute Care Choice:  Durable Medical Equipment, Home Health Choice offered to:  Patient  DME Arranged:  NIV, Earl bed DME Agency:  Earl Earl:  RN, PT, NIV, Nurse's Aide, Social Work Earl Earl Agency:  Earl Earl  Status of Service:  In process, will continue to follow  If discussed at Long Length of Stay Meetings, dates discussed:    Additional Comments:  Earl Rafter, RN 08/11/2018, 1:16 PM

## 2018-08-11 NOTE — Care Management (Signed)
Patient suffers from CAD, COPD, oxygen dependence, GERD, Lung Cancer and has trouble breathing at night when head is elevated less than 30 degrees.  Bed wedges do not provide enough elevation to resolve breathing issues.  Shortness of breath and decreased oxygen saturations cause the patient to require frequent changes in body position which cannot be achieved with a normal bed.    Earl Clay, [place]

## 2018-08-11 NOTE — Progress Notes (Signed)
[facility] at [place] NAME: Earl Clay    MR#:  [id number]  DATE OF BIRTH:  19-Oct-1940  SUBJECTIVE: Still on BiPAP at FiO2 35%.  Tachycardic with heart rate up to 115 bpm.  Seen by palliative care, hospice.  Patient can follow-up with hospice at home.  CHIEF COMPLAINT:   Chief Complaint  Patient presents with  . Shortness of Breath    REVIEW OF SYSTEMS:   Review of Systems  Constitutional: Positive for malaise/fatigue. Negative for chills and fever.  HENT: Negative for hearing loss.   Eyes: Negative for blurred vision, double vision and photophobia.  Respiratory: Positive for cough. Negative for hemoptysis.   Cardiovascular: Negative for palpitations, orthopnea and leg swelling.  Gastrointestinal: Positive for nausea. Negative for abdominal pain, diarrhea and vomiting.  Genitourinary: Negative for dysuria and urgency.  Musculoskeletal: Positive for myalgias. Negative for neck pain.  Skin: Negative for rash.  Neurological: Negative for dizziness, focal weakness, seizures, weakness and headaches.  Psychiatric/Behavioral: Negative for memory loss. The patient does not have insomnia.       DRUG ALLERGIES:   Allergies  Allergen Reactions  . Ampicillin Hives and Other (See Comments)    Has patient had a PCN reaction causing immediate rash, facial/tongue/throat swelling, SOB or lightheadedness with hypotension: No Has patient had a PCN reaction causing severe rash involving mucus membranes or skin necrosis: No Has patient had a PCN reaction that required hospitalization No Has patient had a PCN reaction occurring within the last 10 years: No If all of the above answers are "NO", then may proceed with Cephalosporin use.  Marland Kitchen Penicillins Hives and Other (See Comments)    Has patient had a PCN reaction causing immediate rash, facial/tongue/throat swelling, SOB or lightheadedness with hypotension: No Has patient had a PCN  reaction causing severe rash involving mucus membranes or skin necrosis: No Has patient had a PCN reaction that required hospitalization No Has patient had a PCN reaction occurring within the last 10 years: No If all of the above answers are "NO", then may proceed with Cephalosporin use.  . Zyban [Bupropion] Rash    VITALS:  Blood pressure 117/61, pulse (!) 109, temperature 98.4 F (36.9 C), temperature source Oral, resp. rate (!) 34, height 5\' 6"  (1.676 m), weight 71.4 kg, SpO2 (!) 87 %.  PHYSICAL EXAMINATION:  GENERAL:  78 y.o.-year-old patient lying in the bed with no acute distress.  On BiPAP. EYES: Pupils equal, round, reactive to light and accommodation. No scleral icterus. Extraocular muscles intact.  HEENT: Head atraumatic, normocephalic. Oropharynx and nasopharynx clear.  NECK:  Supple, no jugular venous distention. No thyroid enlargement, no tenderness.  LUNGS:  diminished air entry bilaterally. CARDIOVASCULAR: S1, S2 normal. No murmurs, rubs, or gallops.  ABDOMEN: Soft, nontender, nondistended. Bowel sounds present. No organomegaly or mass.  EXTREMITIES: No pedal edema, cyanosis, or clubbing.  NEUROLOGIC: Cranial nerves II through XII are intact. Muscle strength 5/5 in all extremities. Sensation intact. Gait not checked.  PSYCHIATRIC: The patient is alert and oriented x 3.  SKIN: No obvious rash, lesion, or ulcer.    LABORATORY PANEL:   CBC Recent Labs  Lab 08/10/18 0256  WBC 13.7*  HGB 11.1*  HCT 36.2*  PLT 227   ------------------------------------------------------------------------------------------------------------------  Chemistries  Recent Labs  Lab 08/08/18 1231 08/09/18 0509 08/10/18 0256  NA 131* 136 138  K 3.2* 4.6 4.2  CL 95* 105 104  CO2 25 24 27   GLUCOSE 156* 189*  75  BUN 34* 30* 35*  CREATININE 1.26* 1.12 1.16  CALCIUM 8.8* 8.0* 8.3*  MG  --  2.1  --   AST 29  --   --   ALT 11  --   --   ALKPHOS 77  --   --   BILITOT 0.8  --   --     ------------------------------------------------------------------------------------------------------------------  Cardiac Enzymes No results for input(s): TROPONINI in the last 168 hours. ------------------------------------------------------------------------------------------------------------------  RADIOLOGY:  No results found.  EKG:   Orders placed or performed during the hospital encounter of 09/07/15  . ED EKG  . ED EKG  . EKG 12-Lead  . EKG 12-Lead  . EKG 12-Lead  . EKG 12-Lead  . EKG 12-Lead  . EKG 12-Lead  . EKG 12-Lead  . EKG 12-Lead  . EKG 12-Lead  . EKG 12-Lead  . EKG 12-Lead  . EKG 12-Lead  . EKG    ASSESSMENT AND PLAN:    Acute on chronic hypoxic respiratory failure secondary to COPD exacerbation: Patient on BiPAP.  Admit sats dropping to low 80s on nasal oxygen, able to wean off the BiPAP yet continue BiPAP, 2.  Multifocal pneumonia causing acute on chronic respiratory failure, continue BiPAP, decrease FiO2 as tolerated, continue IV antibiotics. 3.  Sepsis present on admission secondary to pneumonia: Evidence of cough respiratory rate, tachycardia, elevated white count,   4 hypokalemia: Replaced.-Better. 5.  Diabetes mellitus type 2 ; continue sliding scale insulin with coverage, decrease Levemir to 12 units to prevent hypoglycemia's. #6 COPD exacerbation, no wheezing, continue bronchodilators, IV steroids, Support.  Patient is on theophylline.  Continue. 7.  Essential hypertension: Controlled. 8.  History of right lung cancer, has history of emphysema. Prognosis, seen by palliative care team,   All the records are reviewed and case discussed with Care Management/Social Workerr. Management plans discussed with the patient, family and they are in agreement.  CODE STATUS: DNR  TOTAL TIME TAKING CARE OF THIS PATIENT 38:minutes.   Possible discharge in next 1 to 2 days.  Patient is to be off the BiPAP before working with physical  therapy.Epifanio Lesches M.D on 08/11/2018 at 2:34 PM  Between 7am to 6pm - Pager - [phone]  After 6pm go to [email or web address] - password [email or web address] [facility]  Office  [phone]  CC: Primary care physician; [name], MD   Note: This dictation was prepared with Dragon dictation along with smaller phrase technology. Any transcriptional errors that result from this process are unintentional.

## 2018-08-11 NOTE — Progress Notes (Signed)
PT Cancellation Note  Patient Details Name: Earl Clay MRN: [id number] DOB: [birth year]   Cancelled Treatment:    Reason Eval/Treat Not Completed: Patient not medically ready.  Chart reviewed.  MD notes indicating continued respiratory concerns.  Pt does not appear appropriate for participation in physical therapy.  Will hold PT at this time and re-attempt PT evaluation at a later date/time as medically appropriate.  Leitha Bleak, PT 08/11/18, 4:03 PM [phone]

## 2018-08-11 NOTE — Care Management (Signed)
Severe COPD and end stage emphysema; chronic respiratory failure with hypoxemia.  Patient continues to exhibit signs of hypercapnia associated with chronic respiratory failure secondary to severe COPD.  Patient requires the use of NIV both QHS and daytime to help with exacerbation periods.  The use of NIV will treat the patient's high PCO2 levels and can reduce risk of exacerbations and future hospitalizations when used at night and during the day.  Patient will need these advanced settings in conjunction with his current medication regimen; BIPAP with backup rate has been tried and failed.  Failure to have NIV available for use over a 24 hr period could lead to death.  Earl Clay, [place]

## 2018-08-12 LAB — GLUCOSE, CAPILLARY
GLUCOSE-CAPILLARY: 260 mg/dL — AB (ref 70–99)
Glucose-Capillary: 235 mg/dL — ABNORMAL HIGH (ref 70–99)
Glucose-Capillary: 190 mg/dL — ABNORMAL HIGH (ref 70–99)
Glucose-Capillary: 345 mg/dL — ABNORMAL HIGH (ref 70–99)
Glucose-Capillary: 369 mg/dL — ABNORMAL HIGH (ref 70–99)
Glucose-Capillary: 126 mg/dL — ABNORMAL HIGH (ref 70–99)

## 2018-08-12 MED ORDER — INSULIN DETEMIR 100 UNIT/ML ~~LOC~~ SOLN
15.0000 [IU] | Freq: Every day | SUBCUTANEOUS | Status: DC
  Administered 2018-08-13: 15 [IU] via SUBCUTANEOUS
  Filled 2018-08-12: qty 0.15

## 2018-08-12 MED ORDER — METHYLPREDNISOLONE SODIUM SUCC 40 MG IJ SOLR
40.0000 mg | INTRAMUSCULAR | Status: DC
  Administered 2018-08-12: 40 mg via INTRAVENOUS
  Filled 2018-08-12: qty 1

## 2018-08-12 MED ORDER — ACETYLCYSTEINE 20 % IN SOLN
4.0000 mL | Freq: Two times a day (BID) | RESPIRATORY_TRACT | Status: DC
  Administered 2018-08-12 – 2018-08-13 (×2): 4 mL via RESPIRATORY_TRACT
  Filled 2018-08-12 (×4): qty 4

## 2018-08-12 NOTE — Progress Notes (Signed)
[facility] at [place] NAME: Earl Clay    MR#:  [id number]  DATE OF BIRTH:  May 25, 1941  SUBJECTIVE: Patient is off BiPAP, transferred out of ICU yesterday, on nasal cannula.  Sitting in bed without any distress and able to talk full sentences.  Denies any shortness of breath, cough more than usual.  CHIEF COMPLAINT:   Chief Complaint  Patient presents with  . Shortness of Breath    REVIEW OF SYSTEMS:   Review of Systems  Constitutional: Positive for malaise/fatigue. Negative for chills and fever.  HENT: Negative for hearing loss.   Eyes: Negative for blurred vision, double vision and photophobia.  Respiratory: Positive for cough. Negative for hemoptysis.   Cardiovascular: Negative for palpitations, orthopnea and leg swelling.  Gastrointestinal: Negative for abdominal pain, diarrhea, nausea and vomiting.  Genitourinary: Negative for dysuria and urgency.  Musculoskeletal: Positive for myalgias. Negative for neck pain.  Skin: Negative for rash.  Neurological: Negative for dizziness, focal weakness, seizures, weakness and headaches.  Psychiatric/Behavioral: Negative for memory loss. The patient does not have insomnia.       DRUG ALLERGIES:   Allergies  Allergen Reactions  . Ampicillin Hives and Other (See Comments)    Has patient had a PCN reaction causing immediate rash, facial/tongue/throat swelling, SOB or lightheadedness with hypotension: No Has patient had a PCN reaction causing severe rash involving mucus membranes or skin necrosis: No Has patient had a PCN reaction that required hospitalization No Has patient had a PCN reaction occurring within the last 10 years: No If all of the above answers are "NO", then may proceed with Cephalosporin use.  Marland Kitchen Penicillins Hives and Other (See Comments)    Has patient had a PCN reaction causing immediate rash, facial/tongue/throat swelling, SOB or lightheadedness with  hypotension: No Has patient had a PCN reaction causing severe rash involving mucus membranes or skin necrosis: No Has patient had a PCN reaction that required hospitalization No Has patient had a PCN reaction occurring within the last 10 years: No If all of the above answers are "NO", then may proceed with Cephalosporin use.  . Zyban [Bupropion] Rash    VITALS:  Blood pressure 137/79, pulse (!) 102, temperature 97.7 F (36.5 C), temperature source Oral, resp. rate 17, height 5\' 6"  (1.676 m), weight 71.4 kg, SpO2 99 %.  PHYSICAL EXAMINATION:  GENERAL:  78 y.o.-year-old patient lying in the bed with no acute distress.  On nasal cannula. EYES: Pupils equal, round, reactive to light and accommodation. No scleral icterus. Extraocular muscles intact.  HEENT: Head atraumatic, normocephalic. Oropharynx and nasopharynx clear.  NECK:  Supple, no jugular venous distention. No thyroid enlargement, no tenderness.  LUNGS:  diminished air entry bilaterally. CARDIOVASCULAR: S1, S2 normal. No murmurs, rubs, or gallops.  ABDOMEN: Soft, nontender, nondistended. Bowel sounds present. No organomegaly or mass.  EXTREMITIES: No pedal edema, cyanosis, or clubbing.  NEUROLOGIC: Cranial nerves II through XII are intact. Muscle strength 5/5 in all extremities. Sensation intact. Gait not checked.  PSYCHIATRIC: The patient is alert and oriented x 3.  SKIN: No obvious rash, lesion, or ulcer.    LABORATORY PANEL:   CBC Recent Labs  Lab 08/10/18 0256  WBC 13.7*  HGB 11.1*  HCT 36.2*  PLT 227   ------------------------------------------------------------------------------------------------------------------  Chemistries  Recent Labs  Lab 08/08/18 1231 08/09/18 0509 08/10/18 0256  NA 131* 136 138  K 3.2* 4.6 4.2  CL 95* 105 104  CO2 25 24 27  GLUCOSE 156* 189* 75  BUN 34* 30* 35*  CREATININE 1.26* 1.12 1.16  CALCIUM 8.8* 8.0* 8.3*  MG  --  2.1  --   AST 29  --   --   ALT 11  --   --   ALKPHOS  77  --   --   BILITOT 0.8  --   --    ------------------------------------------------------------------------------------------------------------------  Cardiac Enzymes No results for input(s): TROPONINI in the last 168 hours. ------------------------------------------------------------------------------------------------------------------  RADIOLOGY:  No results found.  EKG:   Orders placed or performed during the hospital encounter of 09/07/15  . ED EKG  . ED EKG  . EKG 12-Lead  . EKG 12-Lead  . EKG 12-Lead  . EKG 12-Lead  . EKG 12-Lead  . EKG 12-Lead  . EKG 12-Lead  . EKG 12-Lead  . EKG 12-Lead  . EKG 12-Lead  . EKG 12-Lead  . EKG 12-Lead  . EKG    ASSESSMENT AND PLAN:    Acute on chronic hypoxic respiratory failure secondary to COPD exacerbation: P clinically improved slowly, patient is off BiPAP, on chronic oxygen  2.  Multifocal pneumonia causing acute on chronic respiratory failure, c weaned off the BiPAP, clinically improved, now back to nasal cannula, continue IV antibiotics, possible discharge next 1 to 2 days pending more clinical improvement  3.  Sepsis present on admission secondary to pneumonia: Evidence of cough respiratory rate, tachycardia, elevated white count, current clinically improving.  Continue Rocephin, Zithromax.  4 hypokalemia: Replaced.-Better. 5.  Diabetes mellitus type 2 ; uncontrolled, blood sugar is in 300s range, adjust Levemir, continue sliding scale insulin with coverage. #6 COPD exacerbation, no wheezing, continue bronchodilators, continue theophylline, decrease dose of IV steroid.   7.  Essential hypertension: Controlled.:  Continue Lasix, lisinopril. 8..  History of right lung cancer, has history of emphysema.  Prognosis poor, seen by palliative care team, patient decided will go home with hospital bed, trilogy machine, home health services, palliative care team to follow at home.  Most likely discharge either tomorrow or day after  pending further clinical improvement, arranging home health staff. Prognosis, seen by palliative care team,  #9.. sinus tachycardia: Improving with Cardizem. All the records are reviewed and case discussed with Care Management/Social Workerr. Management plans discussed with the patient, family and they are in agreement.  CODE STATUS: DNR More than 50% time spent in counseling, coordination of care TOTAL TIME TAKING CARE OF THIS PATIENT 38:minutes.   Possible discharge in next 1 to 2 days.  Epifanio Lesches M.D on 08/12/2018 at 1:25 PM  Between 7am to 6pm - Pager - [phone]  After 6pm go to [email or web address] - password [email or web address] [facility]  Office  [phone]  CC: Primary care physician; [name], MD   Note: This dictation was prepared with Dragon dictation along with smaller phrase technology. Any transcriptional errors that result from this process are unintentional.

## 2018-08-12 NOTE — Evaluation (Signed)
Physical Therapy Evaluation Patient Details Name: RYLAN BERNARD MRN: [id number] DOB: 1940/12/09 Today's Date: 08/12/2018   History of Present Illness  Pt is a 78 y.o. male presenting to hospital 08/08/18 with increasing SOB, malaise, generalized weakness, and body pain.  Pt admitted with severe hypoxic and hypercapnic respiratory failure, community acquired PNA, and acute renal insufficiency.  Initially requiring BiPAP and in ICU but now transferred to medical floor.  PMH includes COPD, h/o lung CA on home O2 (CPAP at night; 4-5 L O2 during the day), DM, PTSD, R knee repair, and R inguinal hernia repair.  Clinical Impression  Prior to hospital admission, pt was ambulatory (no AD); home O2 use.  Pt lives alone in 1 level home with steps to enter; plan for pt to have 24/7 assist upon hospital discharge.  Currently pt is modified independent with bed mobility and CGA with transfer bed to recliner with single UE support.  Mild SOB noted with activity.  O2 sats 98% or greater on 15 L O2 via HFNC.  Pt would benefit from skilled PT to address noted impairments and functional limitations (see below for any additional details).  Upon hospital discharge, recommend pt discharge with 24/7 assist and HHPT.    Follow Up Recommendations Home health PT;Supervision/Assistance - 24 hour    Equipment Recommendations  Hospital bed;Wheelchair (measurements PT);Wheelchair cushion (measurements PT)(Anticipate RW)    Recommendations for Other Services       Precautions / Restrictions Precautions Precautions: Fall Restrictions Weight Bearing Restrictions: No      Mobility  Bed Mobility Overal bed mobility: Modified Independent             General bed mobility comments: Sit to semi supine to sit with no noted difficulties.  Transfers Overall transfer level: Needs assistance Equipment used: None Transfers: Sit to/from [facility] Sit to Stand: Min guard Stand pivot transfers: Min  guard       General transfer comment: pt able to stand and then take a few steps bed to recliner with single UE support on recliner armrest for balance; CGA for safety provided  Ambulation/Gait             General Gait Details: Deferred d/t pt on HFNC (O2 connected to wall)  Stairs            Wheelchair Mobility    Modified Rankin (Stroke Patients Only)       Balance Overall balance assessment: Needs assistance Sitting-balance support: No upper extremity supported Sitting balance-Leahy Scale: Normal Sitting balance - Comments: steady sitting reaching outside BOS   Standing balance support: Single extremity supported Standing balance-Leahy Scale: Poor Standing balance comment: pt requiring at least single UE support for balance                             Pertinent Vitals/Pain Pain Assessment: No/denies pain  HR 98 to 103 bpm during session.    Home Living Family/patient expects to be discharged to:: Private residence Living Arrangements: Alone(Plan to have 24/7 assist upon hospital discharge) Available Help at Discharge: Family;Available 24 hours/day;Friend(s) Type of Home: House Home Access: Stairs to enter   [employer] of Steps: 3 steps with R grab bar to enter home Home Layout: One level        Prior Function Level of Independence: Independent         Comments: Pt reports only 1 recent fall just prior to hospital admission (no other  falls in past 6 months).  Was driving about 2 weeks ago.     Hand Dominance        Extremity/Trunk Assessment   Upper Extremity Assessment Upper Extremity Assessment: Generalized weakness    Lower Extremity Assessment Lower Extremity Assessment: Generalized weakness    Cervical / Trunk Assessment Cervical / Trunk Assessment: Kyphotic  Communication   Communication: No difficulties  Cognition Arousal/Alertness: Awake/alert Behavior During Therapy: WFL for tasks  assessed/performed Overall Cognitive Status: Within Functional Limits for tasks assessed                                        General Comments   Nursing cleared pt for participation in physical therapy.  Pt agreeable to limited PT session due to not having had lunch yet (PT assisted pt with ordering lunch and lunch arrived by end of session).    Exercises     Assessment/Plan    PT Assessment Patient needs continued PT services  PT Problem List Decreased strength;Decreased activity tolerance;Decreased balance;Decreased mobility;Decreased knowledge of use of DME;Cardiopulmonary status limiting activity       PT Treatment Interventions DME instruction;Gait training;Stair training;Functional mobility training;Therapeutic activities;Therapeutic exercise;Balance training;Patient/family education    PT Goals (Current goals can be found in the Care Plan section)  Acute Rehab PT Goals Patient Stated Goal: to go home PT Goal Formulation: With patient Time For Goal Achievement: 08/26/18 Potential to Achieve Goals: Good    Frequency Min 2X/week   Barriers to discharge        Co-evaluation               AM-PAC PT "6 Clicks" Mobility  Outcome Measure Help needed turning from your back to your side while in a flat bed without using bedrails?: None Help needed moving from lying on your back to sitting on the side of a flat bed without using bedrails?: None Help needed moving to and from a bed to a chair (including a wheelchair)?: A Little Help needed standing up from a chair using your arms (e.g., wheelchair or bedside chair)?: A Little Help needed to walk in hospital room?: A Little Help needed climbing 3-5 steps with a railing? : A Lot 6 Click Score: 19    End of Session Equipment Utilized During Treatment: Gait belt Activity Tolerance: Patient tolerated treatment well Patient left: in chair;with call bell/phone within reach;with chair alarm set;with  family/visitor present Nurse Communication: Mobility status;Precautions PT Visit Diagnosis: Other abnormalities of gait and mobility (R26.89);Muscle weakness (generalized) (M62.81);History of falling (Z91.81);Difficulty in walking, not elsewhere classified (R26.2)    Time: 1594-5859 PT Time Calculation (min) (ACUTE ONLY): 16 min   Charges:   PT Evaluation $PT Eval Low Complexity: 1 Low          Saiya Crist, PT 08/12/18, 3:00 PM [phone]

## 2018-08-12 NOTE — Progress Notes (Signed)
PALLIATIVE NOTE:   Patient sitting on the side of bed in orthopnea position. Recently removed BiPAP and placed on HFNC per patient. Reports he just finished eating breakfast which he tolerated well. Thankful for transferring out of ICU and inquiring about going home today. Advised attending provider will assess and make sure all necessary home equipments before discharge. He denies pain, states he has shortness of breath with exertion and at rest. Continues to have restless nights, reporting he cannot get comfortable and once he does he begins to feel as though he is suffocating. Does state ativan and morphine PRN is helpful. Was able to sleep about 3 hrs last night compared to previous nights.   Patient reports his daughter, Velta Addison has been staying the night with him. Girlfriend [name] is at bedside. States [place] went home to rest and also planning to go to patient's home and clean up and rearrange his room with expectations of equipment (hospital bed) to be delivered. [name], questioning patient being discharged in the rain or if not discharged by Friday morning, hopeful he would stay until Monday. Explained to her patient could be discharged in the rain, this would not warrant any contraindications for patient and also patient can be discharged at anytime of the day and any day including weekends. Patient and significant other verbalized understanding.   Patient expressed appreciation for all the care and support while hospitalized. He verbalized understanding of plan to be discharged with NIV for BiPAP use in the home and hospital bed. He states his daughter and [name] are also working on getting some kind of home health for assistance. We reviewed completed MOST forms that patient completed. He confirmed his wishes remained the same and DNR/DNI. He states "this will be the last time you see me unless you come to my house to visit because I hope that I am going home and will die there!" Support given.  Patient and family aware of outpatient palliative and their services. They also understand they can discuss with outpatient team regarding transitioning to hospice care once he no longer wishes to continue use of home BiPAP.   Exam: A&O x3, frail, chronically ill. Shortness of breath at rest, tachycardia, S1,S2, lungs diminished in bases with wheezing, positive bowel sounds, scattered bruising, generalized weakness.   Plan: -DNR/DNI-as confirmed -MOST/Out of facility DNR on chart to be sent with patient/family at discharge -Patient/family aware of pending home equipment delivery/set-up -Outpatient Palliative -Morphine PRN for pain/air hunger -Ativan PRN for anxiety/agitation -PMT will continue to support and follow  Time In: 0900 Time Out: 1005 Total Time: 65 min.  Greater than 50%  of this time was spent counseling and coordinating care related to the above assessment and plan  Alda Lea, AGPCNP-BC Palliative Medicine Team  Pager: [phone] Amion: N. [place]

## 2018-08-12 NOTE — Care Management (Signed)
Severe COPD and end stage emphysema; chronic respiratory failure with hypoxemia.  Patient continues to exhibit signs of hypercapnia associated with chronic respiratory failure secondary to severe COPD.  Patient requires the use of NIV both QHS and daytime to help with exacerbation periods.  The use of NIV will treat the patient's high PCO2 levels and can reduce risk of exacerbations and future hospitalizations when used at night and during the day.  Patient will need these advanced settings in conjunction with his current medication regimen; BIPAP with backup rate has been tried and failed.  Failure to have NIV available for use over a 24 hr period could lead to death.

## 2018-08-12 NOTE — Care Management (Signed)
Notified Corene Cornea with [facility] that the patient is in need of NIV for home.

## 2018-08-12 NOTE — Care Management Important Message (Signed)
Important Message  Patient Details  Name: Earl Clay MRN: [id number] Date of Birth: April 05, 1941   Medicare Important Message Given:  Yes    Juliann Pulse A Yaasir Menken 08/12/2018, 11:21 AM

## 2018-08-13 DIAGNOSIS — R0602 Shortness of breath: Secondary | ICD-10-CM

## 2018-08-13 LAB — CULTURE, BLOOD (ROUTINE X 2)
Culture: NO GROWTH
Culture: NO GROWTH
Special Requests: ADEQUATE

## 2018-08-13 LAB — GLUCOSE, CAPILLARY: Glucose-Capillary: 303 mg/dL — ABNORMAL HIGH (ref 70–99)

## 2018-08-13 MED ORDER — MORPHINE SULFATE (PF) 2 MG/ML IV SOLN
2.0000 mg | INTRAVENOUS | Status: DC | PRN
  Administered 2018-08-13: 2 mg via INTRAVENOUS
  Administered 2018-08-14 (×2): 4 mg via INTRAVENOUS
  Administered 2018-08-14 (×2): 2 mg via INTRAVENOUS
  Administered 2018-08-14: 4 mg via INTRAVENOUS
  Administered 2018-08-15 (×2): 2 mg via INTRAVENOUS
  Filled 2018-08-13 (×2): qty 1
  Filled 2018-08-13 (×2): qty 2
  Filled 2018-08-13 (×2): qty 1
  Filled 2018-08-13 (×2): qty 2

## 2018-08-13 MED ORDER — LORAZEPAM 2 MG/ML PO CONC
1.0000 mg | ORAL | Status: DC | PRN
  Filled 2018-08-13: qty 0.5

## 2018-08-13 MED ORDER — POLYVINYL ALCOHOL 1.4 % OP SOLN
1.0000 [drp] | Freq: Four times a day (QID) | OPHTHALMIC | Status: DC | PRN
  Filled 2018-08-13: qty 15

## 2018-08-13 MED ORDER — GLYCOPYRROLATE 0.2 MG/ML IJ SOLN
0.2000 mg | INTRAMUSCULAR | Status: DC | PRN
  Administered 2018-08-14: 0.2 mg via INTRAVENOUS
  Filled 2018-08-13 (×2): qty 1

## 2018-08-13 MED ORDER — BIOTENE DRY MOUTH MT LIQD: 15.0000 mL | OROMUCOSAL | Status: DC | PRN

## 2018-08-13 MED ORDER — LORAZEPAM 1 MG PO TABS: 1.0000 mg | ORAL_TABLET | ORAL | Status: DC | PRN

## 2018-08-13 MED ORDER — LORAZEPAM 2 MG/ML IJ SOLN
1.0000 mg | INTRAMUSCULAR | Status: DC | PRN
  Administered 2018-08-13: 1 mg via INTRAVENOUS
  Filled 2018-08-13 (×2): qty 1

## 2018-08-13 NOTE — Progress Notes (Signed)
Daily Progress Note   Patient Name: Earl Clay       Date: 08/13/2018 DOB: October 15, 1940  Age: 78 y.o. MRN#: [id number] Attending Physician: Epifanio Lesches, MD Primary Care Physician: [name], MD Admit Date: 08/08/2018  Reason for Consultation/Follow-up: Establishing goals of care, Non pain symptom management, Pain control and Psychosocial/spiritual support  Subjective: Patient is sitting on the side of the bed. He denies pain, but endorses shortness of breath. Refused breakfast this morning. States he did not have much of an appetite. He is A&O x3. Able to answer all questions appropriately. Currently on HFNC 13L. Earl Clay, significant other at the bedside. Daughter, Earl Clay recently left.   Earl Clay expressed patient did not have a good night. Daughter, Earl Clay stayed with him. Refused to wear BiPAP, refused night medications, was restless, refused nasal cannula, and was somewhat confused. Patient interrupted Earl Clay, and expressed he knew what he was doing. He stated "I am ready to go and I am just waiting for the door to close, and I didn't want to do anything to prolong it!" support given. Prompted patient to express his feelings regarding his current care, goals of care for himself, and how we could best assist with what he wanted for himself. Patient was quiet and looking down at the floor, he looked up and stated "I am ready to go home to be with the [place], I am tired of suffering, and I am tired of feeling like I can't breath. Unless you can get me a new body and a new set of lungs, Just please let me be comfortable." support and comfort provided. Earl Clay, tearful. I re-stated to patient his expressed wishes and evaluated his understanding. Patient expressed he understood and new  that meant he was going to die.   I re-discussed his previously expressed wishes of returning home with BiPAP and palliative versus his now expressed wishes for EOL care. Patient expressed he did not want to wear BiPAP any longer and his plans had changed. He states I want to be comfortable and die. "Just don't let me struggle please!" Support given. I discussed hospice at home, residential hospice, and comfort care measures while hospitalized. Patient expressed he did not want to go home or to hospice home if he could help it and would like to have comfort measures in  the hospital. He verbalized he would be ok if he had to go to hospice home later but preferred not to. Support given. I offered to contact daughter and discuss with her, although patient is A& O there were questions of confusion throughout the night. Patient verbalized agreement and approve to call her.   I spoke with daughter via phone who verbalized her sadness of watching her father decline and seeing him so restless during the night. She expressed he said to her multiple times during the night he was ready to die, please let him die, and to stop with treatments. She stated this is why he refused BiPAP and medications during the night. Earl Clay is tearful and states she wants him to be comfortable and not to continue to struggle. I requested to meet with her and all family together with family. She agreed and scheduled to return to meet at bedside at 1230.   1251: Met at bedside with patient, Earl Clay (daughter/POA), and Earl Clay (significant other). Patient is A&O x3. Patient sharing memories with family. Patient expressed to family his wishes not to return home as previously planned, but to shift care to comfort for EOL in the hospital. He was able to express to his family his feelings and that he was tired of fighting and his body was tired. Family tearful. Grandchildren also present however he and daughter asked them to step out. Patient  expressed he knows they would be ok but he didn't want to discuss his wishes in detail in front of them. Support given. Herma Ard, RN requested to join me at bedside for discussion. Patient and daughter expressed clear understanding of comfort care measures, expectations, and confirmed their wishes were to transition care. Support given and Herma Ard, RN verbalized understanding of requested care. Patient and family open to continuous morphine drip if patient's air hunger and pain is not controlled with initial PRN medications. Patient smiling and verbalizing his peace with his decision.   He reports he paid for his funeral services in full over a year ago in anticipation for this day. He joked and stated he even paid his beautician who told him she had never had anyone prepare for their funeral hair cut. Support and comfort given. Allowed family to share memories and laughter. Patient was in a pleasant and peaceful mood. He joked prior to me leaving and said "let the end of life party begin". Family and patient verbalized appreciation of support during hospitalization.   Length of Stay: 5  Current Medications: Scheduled Meds:  . citalopram  20 mg Oral Daily  . furosemide  20 mg Oral Daily  . hydrOXYzine  10 mg Oral QHS  . PARoxetine  40 mg Oral BID  . tamsulosin  0.4 mg Oral QHS  . tiZANidine  4 mg Oral QHS    Continuous Infusions:   PRN Meds: acetaminophen **OR** acetaminophen, albuterol, antiseptic oral rinse, glycopyrrolate, LORazepam **OR** LORazepam **OR** LORazepam, morphine injection, ondansetron **OR** ondansetron (ZOFRAN) IV, polyethylene glycol, polyvinyl alcohol  Physical Exam Vitals signs and nursing note reviewed.  Constitutional:      General: He is awake.     Appearance: He is well-developed. He is ill-appearing.     Comments: Chronically ill appearing   Cardiovascular:     Rate and Rhythm: Tachycardia present.     Pulses: Normal pulses.     Heart sounds: Normal heart sounds.    Pulmonary:     Effort: Tachypnea and accessory muscle usage present.     Breath  sounds: Decreased breath sounds and rhonchi present.     Comments: Shortness of breath, dyspnea with minimum exertion or talking  Abdominal:     General: Bowel sounds are normal.     Palpations: Abdomen is soft.  Skin:    General: Skin is warm and dry.     Findings: Bruising present.  Neurological:     Mental Status: He is alert and oriented to person, place, and time.     Comments: Some confusion reported during the night   Psychiatric:        Attention and Perception: Attention normal.        Mood and Affect: Mood normal.        Speech: Speech normal.        Behavior: Behavior is cooperative.        Thought Content: Thought content normal.        Cognition and Memory: Cognition normal.        Judgment: Judgment normal.             Vital Signs: BP 125/70 (BP Location: Right Arm)   Pulse (!) 104   Temp 97.6 F (36.4 C) (Oral)   Resp 19   Ht '5\' 6"'$  (1.676 m)   Wt 71.4 kg   SpO2 (!) 86% Comment: Resp contacted  BMI 25.41 kg/m  SpO2: SpO2: (!) 86 %(Resp contacted) O2 Device: O2 Device: Room Air O2 Flow Rate: O2 Flow Rate (L/min): 15 L/min  Intake/output summary:   Intake/Output Summary (Last 24 hours) at 08/13/2018 1453 Last data filed at 08/13/2018 1424 Gross per 24 hour  Intake 480 ml  Output 2800 ml  Net -2320 ml   LBM: Last BM Date: 08/08/18 Baseline Weight: Weight: 72.6 kg Most recent weight: Weight: 71.4 kg      Palliative Assessment/Data: PPS 20%   Flowsheet Rows     Most Recent Value  Intake Tab  Referral Department  Critical care  Unit at Time of Referral  ICU  Palliative Care Primary Diagnosis  Pulmonary  Date Notified  08/09/18  Palliative Care Type  New Palliative care  Reason for referral  Clarify Goals of Care  Date of Admission  08/08/18  # of days IP prior to Palliative referral  1  Clinical Assessment  Psychosocial & Spiritual Assessment  Palliative Care  Outcomes      Patient Active Problem List   Diagnosis Date Noted  . Acute on chronic respiratory failure with hypoxemia ([place]) 08/08/2018  . Lung nodule, solitary 08/15/2016  . Primary cancer of bronchus of left upper lobe ([place]) 02/11/2016  . Malnutrition of moderate degree 09/09/2015  . COPD exacerbation ([place]) 09/09/2015  . ARF (acute renal failure) ([place]) 09/07/2015  . Type 2 diabetes mellitus ([place]) 07/20/2015  . Type 2 diabetes mellitus with hyperglycemia ([place]) 05/16/2014  . History of cardiac catheterization 01/24/2014  . Breathlessness on exertion 01/24/2014  . Chronic obstructive bronchitis ([place]) 11/17/2013  . CAD in native artery 11/17/2013  . Benign essential HTN 11/17/2013  . Combined fat and carbohydrate induced hyperlipemia 11/17/2013  . Hypersomnia with sleep apnea 11/17/2013  . Panlobular emphysema ([place]) 11/17/2013  . Pure hypercholesterolemia 11/17/2013  . 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 Elevated prostate specific antigen (PSA) 08/18/2012  . Disorder of bladder function 08/18/2012  . Enlarged prostate with lower urinary tract symptoms (LUTS) 08/18/2012  . Incomplete bladder emptying 08/18/2012  . Benign prostatic hyperplasia with urinary obstruction 08/18/2012  . FOM (frequency of micturition) 08/18/2012    Palliative  Care Assessment & Plan   Patient Profile: 78 y.o. male admitted on 08/08/2018 from home with complaints of worsening shortness of breath. He has a past medical history significant for coronary artery disease, diabetes, obstructive sleep apnea (CPAP), COPD (home oxygen 2-4L), PTSD, anxiety, depression, lung cancer x2 s/p chemoradiation therapy, hypertension, and GERD.  Patient presented to ED with complaints of cough, congestion, and purulent sputum production.  He complained of worsening shortness of breath for a week prior to admission.  During his ED course patient was started on BiPAP.  Chest x-ray showed bilateral patchy pulmonary opacities worrisome  for bronchopneumonia or ill-defined nodules.  Small left pleural effusion.  Sodium 131, potassium 3.2, BUN 34, creatinine 1.26, WBC 20.7.  Since admission he continues on BiPAP with desaturation and tachycardia when attempted to wean, receiving IV antibiotics and steroids, and home medications. Palliative Medicine consulted for goals of care discussion.   Recommendations/Plan:  DNR/DNI-as confirmed by patient and daughter  FULL COMFORT CARE   Patient and family has requested to transition to comfort care. Patient request to treat all symptoms only and for EOL care. Family and patient aware of anticipated hospital death.   Discussed symptom management and possible need for morphine drip for comfort and air hunger. Patient and daughter would like to attempt PRN dosing initially but open to morphine drip if patient continues to struggle and experience air hunger.   Morphine PRN for pain/air hunger  Ativan PRN for anxiety/agitation   Robinul PRN for excessive secretions   Comfort feeds and regular diet if tolerated  Will d/c all medications and orders not comfort focused   Wean off HFNC once symtpoms appropriately managed with a goal of no >2L/[place] for comfort only.   PMT will continue to support and follow.   Goals of Care and Additional Recommendations:  Limitations on Scope of Treatment: Full Comfort Care  Code Status:    Code Status Orders  (From admission, onward)         Start     Ordered   08/08/18 1643  Do not attempt resuscitation (DNR)  Continuous    Question Answer Comment  In the event of cardiac or respiratory ARREST Do not call a "code blue"   In the event of cardiac or respiratory ARREST Do not perform Intubation, CPR, defibrillation or ACLS   In the event of cardiac or respiratory ARREST Use medication by any route, position, wound care, and other measures to relive pain and suffering. May use oxygen, suction and manual treatment of airway obstruction as needed  for comfort.      08/08/18 1642        Code Status History    Date Active Date Inactive Code Status Order ID Comments User Context   09/07/2015 1741 09/09/2015 1717 Full Code [id number]  Hillary Bow, MD ED     Prognosis:   Hours - Days in the setting of FULL COMFORT CARE and medical conditions and co-morbidities.   Discharge Planning:  Anticipated Hospital Death  Care plan was discussed with patient, patient's family, bedside RN, and Dr. Vianne Bulls.   Thank you for allowing the Palliative Medicine Team to assist in the care of this patient.   Time In: 0915 1300 Time Out: 1030 1355 Total Time  130 min.  Prolonged Time Billed  Yes      Greater than 50%  of this time was spent counseling and coordinating care related to the above assessment and plan.  Alda Lea, AGPCNP-BC Palliative Medicine  Team  Pager: [phone] Amion: Bjorn Pippin   Please contact Palliative Medicine Team phone at [phone] for questions and concerns.

## 2018-08-13 NOTE — Progress Notes (Addendum)
Pnt is refusing BIPAP, explained necessary and educated pnt and daughter on importance as his oxygen is 90% or so on 15L of high flow, still refused.    Pnt also refused all 2200 medications. Educated pnt and daughter but pnt still refused.

## 2018-08-13 NOTE — Progress Notes (Signed)
Inpatient Diabetes Program Recommendations  AACE/ADA: New Consensus Statement on Inpatient Glycemic Control  Target Ranges:  Prepandial:   less than 140 mg/dL      Peak postprandial:   less than 180 mg/dL (1-2 hours)      Critically ill patients:  140 - 180 mg/dL   Results for NICHALAS, COIN (MRN [id number]) as of 08/13/2018 11:28  Ref. Range 08/12/2018 07:46 08/12/2018 11:54 08/12/2018 17:07 08/12/2018 21:17 08/13/2018 08:07  Glucose-Capillary Latest Ref Range: 70 - 99 mg/dL 345 (H) 369 (H) 260 (H) 126 (H) 303 (H)   Review of Glycemic Control  Diabetes history: DM2 Outpatient Diabetes medications: Levemir 20 units daily, Novolog 10-20 units TID with meals Current orders for Inpatient glycemic control: Levemir 15 units daily, Novolog 0-9 units TID with meals; Solumedrol 40 mg Q24H  Inpatient Diabetes Program Recommendations:  Insulin - Meal Coverage: If steroids are continued, please consider ordering Novolog 3 units TID with meals for meal coverage if patient eats at least 50% of meals.  Thanks, Barnie Alderman, RN, MSN, CDE Diabetes Coordinator Inpatient Diabetes Program [phone] (Team Pager from 8am to 5pm)

## 2018-08-13 NOTE — Plan of Care (Signed)
Patient woke this AM with [place] removed and unwilling to put back on at this time. SPO2 86%. Patient appears to be confused and difficult to re-direct. Resp contacted to assess and assist if possible. Once Resp arrived - O2 had been re-applied and they confirmed that patient can (at this time) be decreased to 13L (high flow).  This confused family - since they feel that "noone is on the same page and our [place] and discharge plans continue to change."   Resp and I attempted to try to educated that the patinet condition could change, sometimes moment-by moment, and that could also cause change in POC and discharge plans.  Bottomline, as the patient's body adjusts, we will need to adjust if possible.  Daughter requested to speak with Dr. And case management to discuss current POC. Both were notified of family's request and family informed it was completed.  Family also indicating that patient is unable to sleep or lie down, due to resp distress. I assured her that we would do whatever possible to assist him, but also needed to assess it's affects on his resp status.

## 2018-08-13 NOTE — Progress Notes (Signed)
   08/13/18 1100  Clinical Encounter Type  Visited With Family  Visit Type Follow-up;Spiritual support  Referral From Nurse  Spiritual Encounters  Spiritual Needs Emotional  Stress Factors  Patient Stress Factors Not reviewed  Family Stress Factors Health changes;Lack of knowledge;Loss   Chaplain comforted and encouraged patient's daughter Hurshel Keys) in the hallway. Hurshel Keys was tearful as she walked through the hallway. Chaplain approached to determine needs as patient shared her concerns. Patient's daughter was tearful, distraught, and walking briskly through the unit. She shared that her father's health is declining; she loves him dearly. Chaplain asked her father's name and confirmed that the plan was to visit with them this morning. Chaplain invited her to find a seat together if she would would like to talk; patient's daughter welcomed this. Conversation ensued and ranged from feelings around her father's health, personal challenges, family relationships, etc. Chaplain offered care in the form on a non-judgmental, non-anxious, compassionate presence. Patient expressed gratitude and desire for follow-up. Chaplain will follow-up with this patient and family on the unit.

## 2018-08-13 NOTE — Progress Notes (Signed)
[facility] NAME: Earl Clay    MR#:  [id number]  DATE OF BIRTH:  05/19/1941  SUBJECTIVE: Overnight, patient had to go on high flow nasal cannula again up to 13 L, now patient wants to be on comfort care, daughter spoke with patient daughter, she is also in agreement with this.  Patient sitting in the chair and finished his lunch.  He appears alert but he had a rough night and was very confused as per family.  CHIEF COMPLAINT:   Chief Complaint  Patient presents with  . Shortness of Breath    REVIEW OF SYSTEMS:   Review of Systems  Constitutional: Positive for malaise/fatigue. Negative for chills and fever.  HENT: Negative for hearing loss.   Eyes: Negative for blurred vision, double vision and photophobia.  Respiratory: Positive for cough. Negative for hemoptysis.   Cardiovascular: Negative for palpitations, orthopnea and leg swelling.  Gastrointestinal: Negative for abdominal pain, diarrhea, nausea and vomiting.  Genitourinary: Negative for dysuria and urgency.  Musculoskeletal: Positive for myalgias. Negative for neck pain.  Skin: Negative for rash.  Neurological: Negative for dizziness, focal weakness, seizures, weakness and headaches.  Psychiatric/Behavioral: Negative for memory loss. The patient does not have insomnia.       DRUG ALLERGIES:   Allergies  Allergen Reactions  . Ampicillin Hives and Other (See Comments)    Has patient had a PCN reaction causing immediate rash, facial/tongue/throat swelling, SOB or lightheadedness with hypotension: No Has patient had a PCN reaction causing severe rash involving mucus membranes or skin necrosis: No Has patient had a PCN reaction that required hospitalization No Has patient had a PCN reaction occurring within the last 10 years: No If all of the above answers are "NO", then may proceed with Cephalosporin use.  Marland Kitchen Penicillins Hives and Other (See Comments)     Has patient had a PCN reaction causing immediate rash, facial/tongue/throat swelling, SOB or lightheadedness with hypotension: No Has patient had a PCN reaction causing severe rash involving mucus membranes or skin necrosis: No Has patient had a PCN reaction that required hospitalization No Has patient had a PCN reaction occurring within the last 10 years: No If all of the above answers are "NO", then may proceed with Cephalosporin use.  . Zyban [Bupropion] Rash    VITALS:  Blood pressure 125/70, pulse (!) 104, temperature 97.6 F (36.4 C), temperature source Oral, resp. rate 19, height 5\' 6"  (1.676 m), weight 71.4 kg, SpO2 (!) 86 %.  PHYSICAL EXAMINATION:  GENERAL:  78 y.o.-year-old patient lying in the bed with no acute distress.  On nasal cannula. EYES: Pupils equal, round, reactive to light and accommodation. No scleral icterus. Extraocular muscles intact.  HEENT: Head atraumatic, normocephalic. Oropharynx and nasopharynx clear.  NECK:  Supple, no jugular venous distention. No thyroid enlargement, no tenderness.  LUNGS:  diminished air entry bilaterally. CARDIOVASCULAR: S1, S2 normal. No murmurs, rubs, or gallops.  ABDOMEN: Soft, nontender, nondistended. Bowel sounds present. No organomegaly or mass.  EXTREMITIES: No pedal edema, cyanosis, or clubbing.  NEUROLOGIC: Cranial nerves II through XII are intact. Muscle strength 5/5 in all extremities. Sensation intact. Gait not checked.  PSYCHIATRIC: The patient is alert and oriented x 3.  SKIN: No obvious rash, lesion, or ulcer.    LABORATORY PANEL:   CBC Recent Labs  Lab 08/10/18 0256  WBC 13.7*  HGB 11.1*  HCT 36.2*  PLT 227   ------------------------------------------------------------------------------------------------------------------  Chemistries  Recent Labs  Lab 08/08/18 1231 08/09/18 0509 08/10/18 0256  NA 131* 136 138  K 3.2* 4.6 4.2  CL 95* 105 104  CO2 25 24 27   GLUCOSE 156* 189* 75  BUN 34* 30* 35*   CREATININE 1.26* 1.12 1.16  CALCIUM 8.8* 8.0* 8.3*  MG  --  2.1  --   AST 29  --   --   ALT 11  --   --   ALKPHOS 77  --   --   BILITOT 0.8  --   --    ------------------------------------------------------------------------------------------------------------------  Cardiac Enzymes No results for input(s): TROPONINI in the last 168 hours. ------------------------------------------------------------------------------------------------------------------  RADIOLOGY:  No results found.  EKG:   Orders placed or performed during the hospital encounter of 09/07/15  . ED EKG  . ED EKG  . EKG 12-Lead  . EKG 12-Lead  . EKG 12-Lead  . EKG 12-Lead  . EKG 12-Lead  . EKG 12-Lead  . EKG 12-Lead  . EKG 12-Lead  . EKG 12-Lead  . EKG 12-Lead  . EKG 12-Lead  . EKG 12-Lead  . EKG    ASSESSMENT AND PLAN:    Acute on chronic hypoxic respiratory failure secondary to COPD exacerbation: Was off BiPAP, on high flow nasal cannula, patient respiratory status is fluctuating associated with delirium, now he is requesting to be on comfort measures.  She had palliative care team following, patient family especially the twin daughter wants to meet with palliative care nurse, she is aware.  Most likely he will be started on comfort care measures today while in the hospital.    2.  Multifocal pneumonia causing acute on chronic respiratory failure,  weaned off the BiPAP,  3.  Sepsis present on admission secondary to pneumonia: Evidence of cough respiratory rate, tachycardia, elevated white count, current clinically improving.  Continue Rocephin, Zithromax.  4 hypokalemia: Replaced.-Better. 5.  Diabetes mellitus type 2 ; uncontrolled, blood sugar is in 300s range, adjust Levemir, continue sliding scale insulin with coverage. #6 COPD exacerbation, no wheezing, continue bronchodilators, continue theophylline, decrease dose of IV steroid.  On high flow oxygen, planning to start comfort measures today.  7.   Essential hypertension: Controlled.:  Continue Lasix, lisinopril. 8..  History of right lung cancer, has history of emphysema.  Prognosis poor, seen by palliative care team,   #9.. sinus tachycardia: Improving with Cardizem. All the records are reviewed and case discussed with Care Management/Social Workerr. Management plans discussed with the patient, family and they are in agreement.  CODE STATUS: DNR More than 50% time spent in counseling, coordination of care TOTAL TIME TAKING CARE OF THIS PATIENT 38:minutes.   Possible discharge in next 1 to 2 days.  Epifanio Lesches M.D on 08/13/2018 at 12:10 PM  Between 7am to 6pm - Pager - [phone]  After 6pm go to [email or web address] - password [email or web address] [place] [facility]  Office  [phone]  CC: Primary care physician; [name], MD   Note: This dictation was prepared with Dragon dictation along with smaller phrase technology. Any transcriptional errors that result from this process are unintentional.

## 2018-08-13 NOTE — Progress Notes (Signed)
PT Cancellation Note  Patient Details Name: JAKORI BURKETT MRN: [id number] DOB: 1940-09-07   Cancelled Treatment:    Reason Eval/Treat Not Completed: Other (comment).  PT order cancelled by Palliative Care NP.    Leitha Bleak, PT 08/13/18, 3:45 PM [phone]

## 2018-08-13 NOTE — Care Management (Signed)
RNCM received call from unit director requesting that [facility] meet with patient and family.  Palliative NP and RNCM talked and patient is not improving. He has shared with his family that "he wants to die".  Per RN is still requiring high dose of O2 (13L). Patient appears to be leaning toward comfort care.  [facility] on hold for now.

## 2018-08-14 LAB — GLUCOSE, CAPILLARY: Glucose-Capillary: 291 mg/dL — ABNORMAL HIGH (ref 70–99)

## 2018-08-14 MED ORDER — INSULIN ASPART 100 UNIT/ML ~~LOC~~ SOLN
0.0000 [IU] | Freq: Three times a day (TID) | SUBCUTANEOUS | Status: DC
  Administered 2018-08-15: 3 [IU] via SUBCUTANEOUS
  Administered 2018-08-15: 7 [IU] via SUBCUTANEOUS
  Administered 2018-08-15: 9 [IU] via SUBCUTANEOUS
  Administered 2018-08-15 – 2018-08-16 (×4): 3 [IU] via SUBCUTANEOUS
  Filled 2018-08-14 (×7): qty 1

## 2018-08-14 MED ORDER — AZITHROMYCIN 500 MG PO TABS
500.0000 mg | ORAL_TABLET | Freq: Every day | ORAL | Status: DC
  Administered 2018-08-14 – 2018-08-16 (×3): 500 mg via ORAL
  Filled 2018-08-14 (×3): qty 1

## 2018-08-14 MED ORDER — IPRATROPIUM-ALBUTEROL 0.5-2.5 (3) MG/3ML IN SOLN: 3.0000 mL | RESPIRATORY_TRACT | Status: DC | PRN

## 2018-08-14 MED ORDER — TRAZODONE HCL 50 MG PO TABS
25.0000 mg | ORAL_TABLET | Freq: Every evening | ORAL | Status: DC | PRN
  Administered 2018-08-15 – 2018-08-16 (×2): 25 mg via ORAL
  Filled 2018-08-14 (×2): qty 1

## 2018-08-14 MED ORDER — SODIUM CHLORIDE 0.9 % IV SOLN
1.0000 g | INTRAVENOUS | Status: DC
  Administered 2018-08-14 – 2018-08-16 (×3): 1 g via INTRAVENOUS
  Filled 2018-08-14: qty 10
  Filled 2018-08-14 (×3): qty 1
  Filled 2018-08-14: qty 10

## 2018-08-14 NOTE — Progress Notes (Signed)
Contacted by nursing that patient and family wished to discuss goals of care.  Patient and family note that pt status has improved significantly, and wonder if he could receive further treatment and assistance in being discharged back home rather than remaining on comfort measures only here.  Patient has an underlying history of end-stage emphysema and lung cancer, status post chemoradiation, but with active malignant nodules as well per chart review.  He presented to this hospital about 5 days ago with multifocal pneumonia and respiratory failure.  He was on BiPAP for several days and had difficulty weaning.  It was in that context that palliative care was working with patient and family and they eventually agreed on comfort measures at the patient's own request.  This was implemented about 24 hours ago.  However, since that time the patient's oxygen requirement has improved significantly, and he is currently only on 5 L nasal cannula.  Given the significant improvement in his status, he and his daughter request that comfort status be changed, with a goal towards possibly getting the patient back home with assistive services.  I clarified to patient and his daughter that patient certainly does seem to be doing better based on my chart review, such that the only real change in treatment from his current comfort measures going forward would be to reinitiate antibiotics to finish out a full course of treatment.  I certainly do not think there goal of getting the patient back home with assistive services is unreasonable.  I did clarify to them that patient's improvement does not change his underlying chronic conditions which will continue to be progressive.  I reinforced to them that patient and his family should definitely continue discussing long-term goals of treatment, and clarifying end-of-life goals.  Patient and his daughter agree that he does wish to remain DNR/DNI.  Changes made to his orders as discussed with  family above.  [facility] 08/14/2018, 9:18 PM  Note:  This document was prepared using Dragon voice recognition software and may include unintentional dictation errors.

## 2018-08-14 NOTE — Progress Notes (Signed)
Patients daughter is concerned if hospice and comfort care is the right path.  Wants to make sure there are no other options we can do before we get to that stage of care.  Daughter feels patient is not as "out of it" as he was before maybe from [place].  Spoke with MD Lance Coon will come to bedside.

## 2018-08-14 NOTE — Progress Notes (Signed)
[facility] at [place] NAME: Earl Clay    MR#:  [id number]  DATE OF BIRTH:  [birth year]  SUBJECTIVE:  CHIEF COMPLAINT:   Chief Complaint  Patient presents with  . Shortness of Breath   No new complaints reported this morning.  Patient said to have slept better for the first time in a few days after getting IV morphine last night.  Currently on comfort care measures only.  Updated family members present at bedside.   Patient reported to have gotten more agitated with Ativan last night and family does not want patient getting Ativan going forward.  Discontinued and added to her list of allergies.  REVIEW OF SYSTEMS:  ROS  Unobtainable at this time since patient still very sedated from effect of morphine and Ativan administered last night  DRUG ALLERGIES:   Allergies  Allergen Reactions  . Ampicillin Hives and Other (See Comments)    Has patient had a PCN reaction causing immediate rash, facial/tongue/throat swelling, SOB or lightheadedness with hypotension: No Has patient had a PCN reaction causing severe rash involving mucus membranes or skin necrosis: No Has patient had a PCN reaction that required hospitalization No Has patient had a PCN reaction occurring within the last 10 years: No If all of the above answers are "NO", then may proceed with Cephalosporin use.  Marland Kitchen Penicillins Hives and Other (See Comments)    Has patient had a PCN reaction causing immediate rash, facial/tongue/throat swelling, SOB or lightheadedness with hypotension: No Has patient had a PCN reaction causing severe rash involving mucus membranes or skin necrosis: No Has patient had a PCN reaction that required hospitalization No Has patient had a PCN reaction occurring within the last 10 years: No If all of the above answers are "NO", then may proceed with Cephalosporin use.  . Zyban [Bupropion] Rash   VITALS:  Blood pressure (!) 150/83, pulse (!) 165,  temperature 97.6 F (36.4 C), temperature source Oral, resp. rate 19, height 5\' 6"  (1.676 m), weight 71.4 kg, SpO2 93 %. PHYSICAL EXAMINATION:  Physical Exam  GENERAL:  78 y.o.-year-old patient lying in the bed with no acute distress.  Resting comfortably at this time. EYES: Pupils equal, round, reactive to light and accommodation. No scleral icterus. Extraocular muscles intact.  HEENT: Head atraumatic, normocephalic. Oropharynx and nasopharynx clear.  NECK:  Supple, no jugular venous distention. No thyroid enlargement, no tenderness.  LUNGS:  diminished air entry bilaterally. CARDIOVASCULAR: S1, S2 normal. No murmurs, rubs, or gallops.  ABDOMEN: Soft, nontender, nondistended. Bowel sounds present. No organomegaly or mass.  EXTREMITIES: No pedal edema, cyanosis, or clubbing.  NEUROLOGIC: Patient still sleepy from effect of morphine and Ativan from last night.   PSYCHIATRIC: Resting comfortably at this time due to effect of morphine. SKIN: No obvious rash, lesion, or ulcer.   LABORATORY PANEL:  Male CBC Recent Labs  Lab 08/10/18 0256  WBC 13.7*  HGB 11.1*  HCT 36.2*  PLT 227   ------------------------------------------------------------------------------------------------------------------ Chemistries  Recent Labs  Lab 08/08/18 1231 08/09/18 0509 08/10/18 0256  NA 131* 136 138  K 3.2* 4.6 4.2  CL 95* 105 104  CO2 25 24 27   GLUCOSE 156* 189* 75  BUN 34* 30* 35*  CREATININE 1.26* 1.12 1.16  CALCIUM 8.8* 8.0* 8.3*  MG  --  2.1  --   AST 29  --   --   ALT 11  --   --   ALKPHOS 77  --   --  BILITOT 0.8  --   --    RADIOLOGY:  No results found. ASSESSMENT AND PLAN:   1.Acute on chronic hypoxic respiratory failure secondary to COPD exacerbation:  Patient already weaned off BiPAP.  Currently on oxygen via nasal cannula.   Palliative care team already discussed with patient and family.  Comfort care measures already initiated prior and patient currently resting  comfortably.   Monitor patient closely.  Will readdress discharge plans with patient and daughter regarding hospice if patient still in the hospital after the weekend.   2.  Multifocal pneumonia causing acute on chronic respiratory failure,  weaned off the BiPAP.  Requiring oxygen via nasal cannula.  Already started on comfort care measures  3.  Sepsis present on admission secondary to pneumonia: Evidence of cough respiratory rate, tachycardia, elevated white count, current clinically improving.  Continue Rocephin, Zithromax.   4 hypokalemia: Replaced.  5.  Diabetes mellitus type 2 ; Patient currently on comfort care measures   6 COPD exacerbation, no wheezing, continue bronchodilators, continue theophylline, decrease dose of IV steroid.    Currently on oxygen via nasal cannula.  On comfort care measures already   7.  Essential hypertension: Fairly controlled.:  Continue Lasix, lisinopril.  8..  History of right lung cancer, has history of emphysema.  Prognosis poor, seen by palliative care team patient already started on comfort care measures only prior to my taking over care of patient.  9.. sinus tachycardia: Heart rate better controlled  DVT prophylaxis; Lovenox previously discontinued after patient was started on comfort care measures only   All the records are reviewed and case discussed with Care Management/Social Worker. Management plans discussed with the patient, family and they are in agreement.  CODE STATUS: DNR  TOTAL TIME TAKING CARE OF THIS PATIENT: 35 minutes.   More than 50% of the time was spent in counseling/coordination of care: YES  POSSIBLE D/C IN 2 DAYS, DEPENDING ON CLINICAL CONDITION.   Kirstine Jacquin M.D on 08/14/2018 at 12:15 PM  Between 7am to 6pm - Pager - [phone]  After 6pm go to [email or web address] - Proofreader  [facility]  Office  [phone]  CC: Primary care physician; [name], MD  Note: This  dictation was prepared with Dragon dictation along with smaller phrase technology. Any transcriptional errors that result from this process are unintentional.

## 2018-08-15 LAB — GLUCOSE, CAPILLARY
GLUCOSE-CAPILLARY: 345 mg/dL — AB (ref 70–99)
Glucose-Capillary: 257 mg/dL — ABNORMAL HIGH (ref 70–99)
Glucose-Capillary: 370 mg/dL — ABNORMAL HIGH (ref 70–99)
Glucose-Capillary: 237 mg/dL — ABNORMAL HIGH (ref 70–99)

## 2018-08-15 MED ORDER — ENALAPRIL MALEATE 5 MG PO TABS
5.0000 mg | ORAL_TABLET | Freq: Every day | ORAL | Status: DC
  Administered 2018-08-16 – 2018-08-17 (×3): 5 mg via ORAL
  Filled 2018-08-15 (×3): qty 1

## 2018-08-15 MED ORDER — PREDNISONE 20 MG PO TABS
40.0000 mg | ORAL_TABLET | Freq: Every day | ORAL | Status: DC
  Administered 2018-08-15 – 2018-08-17 (×3): 40 mg via ORAL
  Filled 2018-08-15 (×3): qty 2

## 2018-08-15 MED ORDER — SODIUM CHLORIDE 0.9 % IV SOLN
INTRAVENOUS | Status: DC
  Administered 2018-08-15: 11:00:00 via INTRAVENOUS

## 2018-08-15 MED ORDER — POLYETHYLENE GLYCOL 3350 17 G PO PACK
17.0000 g | PACK | Freq: Once | ORAL | Status: AC
  Administered 2018-08-15: 17 g via ORAL
  Filled 2018-08-15: qty 1

## 2018-08-15 MED ORDER — ENOXAPARIN SODIUM 40 MG/0.4ML ~~LOC~~ SOLN
40.0000 mg | SUBCUTANEOUS | Status: DC
  Administered 2018-08-15 – 2018-08-16 (×2): 40 mg via SUBCUTANEOUS
  Filled 2018-08-15 (×2): qty 0.4

## 2018-08-15 MED ORDER — SODIUM CHLORIDE 0.9% FLUSH: 10.0000 mL | INTRAVENOUS | Status: DC | PRN

## 2018-08-15 NOTE — Evaluation (Signed)
Physical Therapy Evaluation Patient Details Name: Earl Clay MRN: [id number] DOB: 09-03-1940 Today's Date: 08/15/2018   History of Present Illness  Pt is a 78 y.o. male presenting to hospital 08/08/18 with increasing SOB, malaise, generalized weakness, and body pain.  Pt admitted with severe hypoxic and hypercapnic respiratory failure, community acquired PNA, and acute renal insufficiency.  Initially requiring BiPAP and in ICU but now transferred to medical floor.  PMH includes COPD, h/o lung CA on home O2 (CPAP at night; 4-5 L O2 during the day), DM, PTSD, R knee repair, and R inguinal hernia repair.  Clinical Impression  Pt is a 78 year old male who lives alone.  He was a Hydrographic surveyor prior to admission.  Pt sitting at EOB upon PT arrival and stating that he did not feel well but that he would like to walk.  Pt on 6L of O2 so no extender applied to O2 on wall.  Pt able to perform bed mobility and STS independently.  He did present with some gait deviations during ambulation but was able to self correct.  Pt may be more steady with use of RW for ambulation at this time.  He presented with very good strength of UE/LE's and reported no sensation loss in feet.  Pt open to education and able to complete all there ex without difficulty.  Pt's O2 sats remained between 93-95% during all activity.  Pt will continue to benefit from skilled PT with focus on tolerance to activity, safe functional mobility and use of RW.      Follow Up Recommendations Home health PT;Supervision/Assistance - 24 hour    Equipment Recommendations  Rolling walker with 5" wheels    Recommendations for Other Services       Precautions / Restrictions Precautions Precautions: Fall Restrictions Weight Bearing Restrictions: No      Mobility  Bed Mobility Overal bed mobility: Modified Independent             General bed mobility comments: Able to return to bed without assistance from  PT  Transfers Overall transfer level: Needs assistance Equipment used: None Transfers: Sit to/from Stand;Stand Pivot Transfers Sit to Stand: Min guard Stand pivot transfers: Min guard       General transfer comment: Stood from bed several times without use of UE  Ambulation/Gait Ambulation/Gait assistance: Supervision Gait Distance (Feet): 40 Feet       Gait velocity interpretation: 1.31 - 2.62 ft/sec, indicative of limited community ambulator General Gait Details: Pt able to ambulate with good step length and foot clearance; one occasion of lateral LOB in which pt caught himself on the wall.  Stairs            Wheelchair Mobility    Modified Rankin (Stroke Patients Only)       Balance Overall balance assessment: Needs assistance Sitting-balance support: No upper extremity supported Sitting balance-Leahy Scale: Normal Sitting balance - Comments: steady sitting reaching outside BOS   Standing balance support: Single extremity supported Standing balance-Leahy Scale: Poor Standing balance comment: pt requiring at least single UE support for balance                             Pertinent Vitals/Pain Pain Assessment: No/denies pain    Home Living Family/patient expects to be discharged to:: Private residence Living Arrangements: Alone(Plan to have 24/7 assist upon hospital discharge) Available Help at Discharge: Family;Available 24 hours/day;Friend(s) Type of Home: House Home Access:  Stairs to enter   [employer] of Steps: 3 steps with R grab bar to enter home Home Layout: One level        Prior Function Level of Independence: Independent         Comments: Pt reports only 1 recent fall just prior to hospital admission (no other falls in past 6 months).  Was driving about 2 weeks ago.     Hand Dominance        Extremity/Trunk Assessment   Upper Extremity Assessment Upper Extremity Assessment: Overall WFL for tasks  assessed    Lower Extremity Assessment Lower Extremity Assessment: Overall WFL for tasks assessed(Grossly 4/5 bilaterally)    Cervical / Trunk Assessment Cervical / Trunk Assessment: Kyphotic  Communication   Communication: No difficulties  Cognition Arousal/Alertness: Awake/alert Behavior During Therapy: WFL for tasks assessed/performed Overall Cognitive Status: Within Functional Limits for tasks assessed                                        General Comments      Exercises Other Exercises Other Exercises: Seated ther ex: LAQ's, pillow squeezes, marching, leanbacks, leanback with trunk rotation x10 with breathing exercises   Assessment/Plan    PT Assessment Patient needs continued PT services  PT Problem List Decreased strength;Decreased activity tolerance;Decreased balance;Decreased mobility;Decreased knowledge of use of DME;Cardiopulmonary status limiting activity       PT Treatment Interventions DME instruction;Gait training;Stair training;Functional mobility training;Therapeutic activities;Therapeutic exercise;Balance training;Patient/family education    PT Goals (Current goals can be found in the Care Plan section)  Acute Rehab PT Goals Patient Stated Goal: to go home PT Goal Formulation: With patient Time For Goal Achievement: 09/09/18 Potential to Achieve Goals: Good    Frequency Min 2X/week   Barriers to discharge        Co-evaluation               AM-PAC PT "6 Clicks" Mobility  Outcome Measure Help needed turning from your back to your side while in a flat bed without using bedrails?: None Help needed moving from lying on your back to sitting on the side of a flat bed without using bedrails?: None Help needed moving to and from a bed to a chair (including a wheelchair)?: None Help needed standing up from a chair using your arms (e.g., wheelchair or bedside chair)?: A Little Help needed to walk in hospital room?: A Little Help  needed climbing 3-5 steps with a railing? : A Lot 6 Click Score: 20    End of Session Equipment Utilized During Treatment: Gait belt;Oxygen(6L) Activity Tolerance: Patient tolerated treatment well Patient left: in chair;with call bell/phone within reach;with chair alarm set;with family/visitor present Nurse Communication: Mobility status;Precautions PT Visit Diagnosis: Other abnormalities of gait and mobility (R26.89);Muscle weakness (generalized) (M62.81);History of falling (Z91.81);Difficulty in walking, not elsewhere classified (R26.2)    Time: 4696-2952 PT Time Calculation (min) (ACUTE ONLY): 21 min   Charges:   PT Evaluation $PT Eval Low Complexity: 1 Low PT Treatments $Therapeutic Exercise: 8-22 mins        Roxanne Gates, PT, DPT  Roxanne Gates 08/15/2018, 3:46 PM

## 2018-08-15 NOTE — Progress Notes (Signed)
[place] at [place] NAME: Kairee Isa    MR#:  [id number]  DATE OF BIRTH:  November 03, 1940  SUBJECTIVE:  CHIEF COMPLAINT:   Chief Complaint  Patient presents with  . Shortness of Breath   Patient more awake and alert this morning.  Last night the patient daughter and patient discussed and they decided to resend the comfort care measures which was recently initiated.  Patient still remains DNR about wants to be treated for ongoing COPD exacerbation.  Treatment regimen resumed. Patient still has some shortness of breath but improving.  No chest pain. Oxygen requirement was increased to 8 L last night but this is gradually being weaned down this morning.  Daughter reports PRN morphine is making him agitated.  Morphine was discontinued this morning. No new complaints reported this morning.  Patient said to have slept better for the first time in a few d   REVIEW OF SYSTEMS:  ROS  10 point review of system unremarkable apart from positive findings mentioned above.  DRUG ALLERGIES:   Allergies  Allergen Reactions  . Ampicillin Hives and Other (See Comments)    Has patient had a PCN reaction causing immediate rash, facial/tongue/throat swelling, SOB or lightheadedness with hypotension: No Has patient had a PCN reaction causing severe rash involving mucus membranes or skin necrosis: No Has patient had a PCN reaction that required hospitalization No Has patient had a PCN reaction occurring within the last 10 years: No If all of the above answers are "NO", then may proceed with Cephalosporin use.  . Lorazepam     Worsening confusion  . Penicillins Hives and Other (See Comments)    Has patient had a PCN reaction causing immediate rash, facial/tongue/throat swelling, SOB or lightheadedness with hypotension: No Has patient had a PCN reaction causing severe rash involving mucus membranes or skin necrosis: No Has patient had a PCN reaction that  required hospitalization No Has patient had a PCN reaction occurring within the last 10 years: No If all of the above answers are "NO", then may proceed with Cephalosporin use.  . Zyban [Bupropion] Rash   VITALS:  Blood pressure 131/64, pulse 79, temperature (!) 97.4 F (36.3 C), temperature source Oral, resp. rate 16, height 5\' 6"  (1.676 m), weight 71.4 kg, SpO2 99 %. PHYSICAL EXAMINATION:  Physical Exam  GENERAL:  78 y.o.-year-old patient lying in the bed with no acute distress.  Resting comfortably at this time. EYES: Pupils equal, round, reactive to light and accommodation. No scleral icterus. Extraocular muscles intact.  HEENT: Head atraumatic, normocephalic. Oropharynx and nasopharynx clear.  NECK:  Supple, no jugular venous distention. No thyroid enlargement, no tenderness.  LUNGS:  diminished air entry bilaterally. CARDIOVASCULAR: S1, S2 normal. No murmurs, rubs, or gallops.  ABDOMEN: Soft, nontender, nondistended. Bowel sounds present. No organomegaly or mass.  EXTREMITIES: No pedal edema, cyanosis, or clubbing.  NEUROLOGIC: Patient still sleepy from effect of morphine and Ativan from last night.   PSYCHIATRIC: Resting comfortably at this time due to effect of morphine. SKIN: No obvious rash, lesion, or ulcer.   LABORATORY PANEL:  Male CBC Recent Labs  Lab 08/10/18 0256  WBC 13.7*  HGB 11.1*  HCT 36.2*  PLT 227   ------------------------------------------------------------------------------------------------------------------ Chemistries  Recent Labs  Lab 08/08/18 1231 08/09/18 0509 08/10/18 0256  NA 131* 136 138  K 3.2* 4.6 4.2  CL 95* 105 104  CO2 25 24 27   GLUCOSE 156* 189* 75  BUN 34* 30*  35*  CREATININE 1.26* 1.12 1.16  CALCIUM 8.8* 8.0* 8.3*  MG  --  2.1  --   AST 29  --   --   ALT 11  --   --   ALKPHOS 77  --   --   BILITOT 0.8  --   --    RADIOLOGY:  No results found. ASSESSMENT AND PLAN:   1.Acute on chronic hypoxic respiratory failure  secondary to COPD exacerbation:  Patient already weaned off BiPAP.  Currently on oxygen via nasal cannula and was requiring 8 L last night.  Being weaned down gradually this morning.  Uses 5 L of oxygen at home. Patient was seen by palliative care team recently and comfort care measures was initiated.  Last night patient's daughter and patient decided to resend the comfort care measures.  Treatment for COPD exacerbation reinstituted including steroids and antibiotics.  Continue bronchodilators..  Monitor gradually for clinical improvement. Physical therapy to evaluate and treat.   2.  Multifocal pneumonia causing acute on chronic respiratory failure,  weaned off the BiPAP.  Requiring oxygen via nasal cannula.  Antibiotics with azithromycin and Rocephin resumed in accordance with patient and family wishes.  3.  Sepsis present on admission secondary to pneumonia: Evidence of cough respiratory rate, tachycardia, elevated white count, current clinically improving.  Continue Rocephin, Zithromax.   4 hypokalemia: Replaced.  5.  Diabetes mellitus type 2 ; Blood sugars appear fairly controlled.  6 COPD exacerbation, Antibiotics with steroids and bronchodilators. Monitor  clinically  Wean down oxygen requirement.     7.  Essential hypertension: Fairly controlled.:  Continue Lasix, lisinopril.  8..  History of right lung cancer, has history of emphysema.  Prognosis poor, seen by palliative care team and comfort care was initially initiated.  This has been rescinded by patient and daughter.  Treatment resumed as mentioned above  9.. sinus tachycardia: Heart rate better controlled  DVT prophylaxis; Lovenox resumed since patient no longer on comfort care.  All the records are reviewed and case discussed with Care Management/Social Worker. Management plans discussed with the patient, family and they are in agreement.  CODE STATUS: DNR  TOTAL TIME TAKING CARE OF THIS PATIENT: 36 minutes.   More  than 50% of the time was spent in counseling/coordination of care: YES  POSSIBLE D/C IN 2 DAYS, DEPENDING ON CLINICAL CONDITION.   Sharece Fleischhacker M.D on 08/15/2018 at 10:56 AM  Between 7am to 6pm - Pager - [phone]  After 6pm go to [email or web address] - Proofreader  [facility]  Office  [phone]  CC: Primary care physician; [name], MD  Note: This dictation was prepared with Dragon dictation along with smaller phrase technology. Any transcriptional errors that result from this process are unintentional.

## 2018-08-15 NOTE — Plan of Care (Signed)
  Problem: Education: Goal: Knowledge of General Education information will improve Description Including pain rating scale, medication(s)/side effects and non-pharmacologic comfort measures Outcome: Progressing   Problem: Health Behavior/Discharge Planning: Goal: Ability to manage health-related needs will improve Outcome: Progressing   Problem: Clinical Measurements: Goal: Ability to maintain clinical measurements within normal limits will improve Outcome: Progressing Goal: Respiratory complications will improve Outcome: Progressing   Problem: Nutrition: Goal: Adequate nutrition will be maintained Outcome: Progressing   Problem: Pain Managment: Goal: General experience of comfort will improve Outcome: Progressing   Problem: Safety: Goal: Ability to remain free from injury will improve Outcome: Progressing

## 2018-08-16 LAB — BLOOD GAS, ARTERIAL
Acid-Base Excess: 12.5 mmol/L — ABNORMAL HIGH (ref 0.0–2.0)
Bicarbonate: 38.9 mmol/L — ABNORMAL HIGH (ref 20.0–28.0)
FIO2: 0.34
O2 Saturation: 70.6 %
PO2 ART: 35 mmHg — AB (ref 83.0–108.0)
Patient temperature: 37
pCO2 arterial: 56 mmHg — ABNORMAL HIGH (ref 32.0–48.0)
pH, Arterial: 7.45 (ref 7.350–7.450)

## 2018-08-16 LAB — MAGNESIUM: Magnesium: 1.8 mg/dL (ref 1.7–2.4)

## 2018-08-16 LAB — GLUCOSE, CAPILLARY
Glucose-Capillary: 203 mg/dL — ABNORMAL HIGH (ref 70–99)
Glucose-Capillary: 242 mg/dL — ABNORMAL HIGH (ref 70–99)
Glucose-Capillary: 241 mg/dL — ABNORMAL HIGH (ref 70–99)
Glucose-Capillary: 303 mg/dL — ABNORMAL HIGH (ref 70–99)

## 2018-08-16 LAB — BASIC METABOLIC PANEL
Anion gap: 6 (ref 5–15)
BUN: 22 mg/dL (ref 8–23)
CO2: 40 mmol/L — ABNORMAL HIGH (ref 22–32)
Calcium: 9 mg/dL (ref 8.9–10.3)
Chloride: 89 mmol/L — ABNORMAL LOW (ref 98–111)
Creatinine, Ser: 0.87 mg/dL (ref 0.61–1.24)
GFR calc Af Amer: >60 mL/min (ref >60)
GFR calc non Af Amer: >60 mL/min (ref >60)
Glucose, Bld: 212 mg/dL — ABNORMAL HIGH (ref 70–99)
Potassium: 3.5 mmol/L (ref 3.5–5.1)
Sodium: 135 mmol/L (ref 135–145)

## 2018-08-16 MED ORDER — INSULIN ASPART 100 UNIT/ML ~~LOC~~ SOLN
0.0000 [IU] | Freq: Three times a day (TID) | SUBCUTANEOUS | Status: DC
  Administered 2018-08-16: 7 [IU] via SUBCUTANEOUS
  Administered 2018-08-17: 2 [IU] via SUBCUTANEOUS
  Filled 2018-08-16 (×2): qty 1

## 2018-08-16 MED ORDER — ENSURE ENLIVE PO LIQD
237.0000 mL | Freq: Two times a day (BID) | ORAL | Status: DC
  Administered 2018-08-16 – 2018-08-17 (×2): 237 mL via ORAL

## 2018-08-16 MED ORDER — ADULT MULTIVITAMIN W/MINERALS CH
1.0000 | ORAL_TABLET | Freq: Every day | ORAL | Status: DC
  Administered 2018-08-17: 1 via ORAL
  Filled 2018-08-16: qty 1

## 2018-08-16 MED ORDER — LACTULOSE 10 GM/15ML PO SOLN
20.0000 g | Freq: Once | ORAL | Status: AC
  Administered 2018-08-16: 20 g via ORAL
  Filled 2018-08-16: qty 30

## 2018-08-16 NOTE — Care Management Important Message (Signed)
Important Message  Patient Details  Name: WHITAKER HOLDERMAN MRN: [id number] Date of Birth: [birth year]   Medicare Important Message Given:  Yes    Juliann Pulse A Graeme Menees 08/16/2018, 11:00 AM

## 2018-08-16 NOTE — Progress Notes (Signed)
Initial Nutrition Assessment  DOCUMENTATION CODES:   Non-severe (moderate) malnutrition in context of chronic illness  INTERVENTION:  Provide Ensure Enlive po BID, each supplement provides 350 kcal and 20 grams of protein.  Provide daily MVI.  Encouraged adequate intake of protein at meals.  NUTRITION DIAGNOSIS:   Moderate Malnutrition related to chronic illness(COPD, hx lung cancer) as evidenced by moderate fat depletion, mild-moderate muscle depletion.  GOAL:   Patient will meet greater than or equal to 90% of their needs  MONITOR:   PO intake, Supplement acceptance, Labs, Weight trends, Skin, I & O's  REASON FOR ASSESSMENT:   Malnutrition Screening Tool    ASSESSMENT:   78 year old male with PMHx of lung cancer, DM, COPD, CAD, GERD, PTSD, pancreatic mass, HTN, asthma, sleep apnea, depression admitted with acute on chronic hypoxic respiratory failure secondary to acute exacerbation of COPD, multifocal PNA, sepsis. Comfort care was initiated but this has now been reversed by patient and family.   Met with patient and a family member at bedside. Patient reports his appetite is "okay" and unchanged from baseline. He eats 2-3 meals per day. He has a "good" breakfast with eggs, a sandwich at lunch, and then a meat with sides for dinner. He typically wears a CGM at home and is able to have better glycemic control at home. He has tried Ensure in the past and is willing to try it again here to best meet elevated calorie/protein needs.  UBW was >200 lbs. Patient reports weight loss over unknown time frame. He is currently 71.4 kg (157.41 lbs).  Meal Completion: 90-100%  Medications reviewed and include: azithromycin, Novolog 0-9 units TID, prednisone 40 mg daily, Flomax, ceftriaxone.  Labs reviewed: CBG 203-370, CO2 40.  NUTRITION - FOCUSED PHYSICAL EXAM:    Most Recent Value  Orbital Region  Moderate depletion  Upper Arm Region  Moderate depletion  Thoracic and Lumbar  Region  Mild depletion  Buccal Region  Moderate depletion  Temple Region  Moderate depletion  Clavicle Bone Region  Moderate depletion  Clavicle and Acromion Bone Region  Moderate depletion  Scapular Bone Region  Mild depletion  Dorsal Hand  Mild depletion  Patellar Region  Mild depletion  Anterior Thigh Region  Mild depletion  Posterior Calf Region  Moderate depletion  Edema (RD Assessment)  None  Hair  Reviewed  Eyes  Reviewed  Mouth  Reviewed  Skin  Reviewed  Nails  Reviewed     Diet Order:   Diet Order            Diet regular Room service appropriate? Yes; Fluid consistency: Thin  Diet effective now             EDUCATION NEEDS:   Education needs have been addressed  Skin:  Skin Assessment: Reviewed RN Assessment  Last BM:  08/14/2018 - large type 3  Height:   Ht Readings from Last 1 Encounters:  08/08/18 '5\' 6"'$  (1.676 m)   Weight:   Wt Readings from Last 1 Encounters:  08/08/18 71.4 kg   Ideal Body Weight:  64.5 kg  BMI:  Body mass index is 25.41 kg/m.  Estimated Nutritional Needs:   Kcal:  4742-5956 (MSJ x 1.2-1.4)  Protein:  85-100 grams (1.2-1.4 grams/kg)  Fluid:  1.7-2 L/day (1 mL/kcal)  Willey Blade, MS, RD, LDN Office: [phone] Pager: [phone] After Hours/Weekend Pager: [phone]

## 2018-08-16 NOTE — Progress Notes (Signed)
Inpatient Diabetes Program Recommendations  AACE/ADA: New Consensus Statement on Inpatient Glycemic Control (2015)  Target Ranges:  Prepandial:   less than 140 mg/dL      Peak postprandial:   less than 180 mg/dL (1-2 hours)      Critically ill patients:  140 - 180 mg/dL   Results for Earl Clay, Earl Clay (MRN [id number]) as of 08/16/2018 11:03  Ref. Range 08/15/2018 07:50 08/15/2018 11:58 08/15/2018 16:43 08/15/2018 21:15  Glucose-Capillary Latest Ref Range: 70 - 99 mg/dL 257 (H) 370 (H)  9 units NOVOLOG  237 (H)  3 units NOVOLOG  345 (H)  7 units NOVOLOG    Results for Earl Clay, Earl Clay (MRN [id number]) as of 08/16/2018 11:03  Ref. Range 08/16/2018 08:05  Glucose-Capillary Latest Ref Range: 70 - 99 mg/dL 203 (H)  3 units NOVOLOG     Home DM Meds: Levemir 20 units Daily       Novolog 10-20 units TID   Current Orders: Novolog Sensitive Correction Scale/ SSI (0-9 units) TID AC     Getting Prednisone 40 mg Daily.  CBGs >200 mg/dl.  Note Comfort Care measures rescinded on 02/09.  Novolog SSI restarted 12pm on 02/09.    MD- Note that patient takes Levemir insulin at home.  CBGs elevated.  If within goals of care, please consider restarting Levemir 15 units Daily (75% total home dose)     --Will follow patient during hospitalization--  Wyn Quaker RN, MSN, CDE Diabetes Coordinator Inpatient Glycemic Control Team Team Pager: [phone] (8a-5p)

## 2018-08-16 NOTE — Progress Notes (Signed)
[facility] NAME: Earl Clay    MR#:  [id number]  DATE OF BIRTH:  03-19-1941  SUBJECTIVE:  CHIEF COMPLAINT:   Chief Complaint  Patient presents with  . Shortness of Breath   Patient more awake and alert this morning.  Patient's daughter present at bedside.  Shortness of breath continues to improve.  Patient discussed with daughter recently and rescinded comfort care and management of COPD was restarted.  Patient still remains DNR.  Patient voiding well since discontinuation of Foley catheter.  Still no bowel movement.  Ordered for lactulose. Requiring about 7 L of oxygen at this time.  Being gradually weaned down.  Patient uses between 5 to 6 L of oxygen at home.  REVIEW OF SYSTEMS:  Review of Systems  Constitutional: Negative for chills and fever.  HENT: Negative for ear pain and hearing loss.   Eyes: Negative for blurred vision.  Respiratory: Positive for shortness of breath. Negative for cough.   Cardiovascular: Negative for chest pain and palpitations.  Gastrointestinal: Negative for abdominal pain, heartburn, nausea and vomiting.  Genitourinary: Negative for dysuria.  Musculoskeletal: Negative for myalgias and neck pain.  Skin: Negative for rash.  Neurological: Negative for dizziness and headaches.  Psychiatric/Behavioral: Negative for depression and hallucinations.      DRUG ALLERGIES:   Allergies  Allergen Reactions  . Ampicillin Hives and Other (See Comments)    Has patient had a PCN reaction causing immediate rash, facial/tongue/throat swelling, SOB or lightheadedness with hypotension: No Has patient had a PCN reaction causing severe rash involving mucus membranes or skin necrosis: No Has patient had a PCN reaction that required hospitalization No Has patient had a PCN reaction occurring within the last 10 years: No If all of the above answers are "NO", then may proceed with Cephalosporin use.  .  Lorazepam     Worsening confusion  . Penicillins Hives and Other (See Comments)    Has patient had a PCN reaction causing immediate rash, facial/tongue/throat swelling, SOB or lightheadedness with hypotension: No Has patient had a PCN reaction causing severe rash involving mucus membranes or skin necrosis: No Has patient had a PCN reaction that required hospitalization No Has patient had a PCN reaction occurring within the last 10 years: No If all of the above answers are "NO", then may proceed with Cephalosporin use.  . Zyban [Bupropion] Rash   VITALS:  Blood pressure (!) 145/79, pulse 87, temperature 97.6 F (36.4 C), temperature source Axillary, resp. rate 17, height 5\' 6"  (1.676 m), weight 71.4 kg, SpO2 95 %. PHYSICAL EXAMINATION:  Physical Exam  GENERAL:  78 y.o.-year-old patient lying in the bed with no acute distress.  Resting comfortably at this time. EYES: Pupils equal, round, reactive to light and accommodation. No scleral icterus. Extraocular muscles intact.  HEENT: Head atraumatic, normocephalic. Oropharynx and nasopharynx clear.  NECK:  Supple, no jugular venous distention. No thyroid enlargement, no tenderness.  LUNGS:  diminished air entry bilaterally. CARDIOVASCULAR: S1, S2 normal. No murmurs, rubs, or gallops.  ABDOMEN: Soft, nontender, nondistended. Bowel sounds present. No organomegaly or mass.  EXTREMITIES: No pedal edema, cyanosis, or clubbing.  NEUROLOGIC: Patient still sleepy from effect of morphine and Ativan from last night.   PSYCHIATRIC: Awake and alert and oriented. SKIN; no skin rash.  No ulcers  LABORATORY PANEL:  Male CBC Recent Labs  Lab 08/10/18 0256  WBC 13.7*  HGB 11.1*  HCT 36.2*  PLT 227   ------------------------------------------------------------------------------------------------------------------  Chemistries  Recent Labs  Lab 08/16/18 0443  NA 135  K 3.5  CL 89*  CO2 40*  GLUCOSE 212*  BUN 22  CREATININE 0.87  CALCIUM 9.0    MG 1.8   RADIOLOGY:  No results found. ASSESSMENT AND PLAN:   1.Acute on chronic hypoxic respiratory failure secondary to COPD exacerbation:  Patient already weaned off BiPAP.  Currently on oxygen via nasal cannula and down to 7 L.  He uses 5 to 6 L of oxygen at home.   Patient was seen by palliative care team recently and comfort care measures was initiated.  Patient discussed with daughter and recently rescinded comfort care measures.  Treatment for COPD exacerbation reinstituted including steroids and antibiotics.  Continue bronchodilators..  Monitor gradually for clinical improvement. Patient seen by physical therapist.  Recommendation is for home health with physical therapy on discharge.  Patient already has a walker at home.   2.  Multifocal pneumonia causing acute on chronic respiratory failure,  weaned off the BiPAP.  Requiring oxygen via nasal cannula.  Antibiotics with azithromycin and Rocephin resumed in accordance with patient and family wishes.  3.  Sepsis present on admission secondary to pneumonia: Evidence of cough respiratory rate, tachycardia, elevated white count, current clinically improving.  Continue Rocephin, Zithromax.   4 hypokalemia: Replaced.  5.  Diabetes mellitus type 2 ; Blood sugars appear fairly controlled.  6 COPD exacerbation, Antibiotics with steroids and bronchodilators. Monitor  clinically  Wean down oxygen requirement.     7.  Essential hypertension: Fairly controlled.:  Continue Lasix, lisinopril.  8..  History of right lung cancer, has history of emphysema.  Prognosis poor, seen by palliative care team and comfort care was initially initiated.  This has been rescinded by patient and daughter.  Treatment resumed as mentioned above  9.. sinus tachycardia: Heart rate better controlled  DVT prophylaxis; Lovenox resumed since patient no longer on comfort care.  All the records are reviewed and case discussed with Care Management/Social  Worker. Management plans discussed with the patient, family and they are in agreement.  CODE STATUS: DNR  TOTAL TIME TAKING CARE OF THIS PATIENT: 26 minutes.   More than 50% of the time was spent in counseling/coordination of care: YES  POSSIBLE D/C IN 1 DAYS, DEPENDING ON CLINICAL CONDITION.   Earl Acevedo M.D on 08/16/2018 at 12:52 PM  Between 7am to 6pm - Pager - [phone]  After 6pm go to [email or web address] - Proofreader  [facility]  Office  [phone]  CC: Primary care physician; [name], MD  Note: This dictation was prepared with Dragon dictation along with smaller phrase technology. Any transcriptional errors that result from this process are unintentional.

## 2018-08-17 LAB — GLUCOSE, CAPILLARY: Glucose-Capillary: 189 mg/dL — ABNORMAL HIGH (ref 70–99)

## 2018-08-17 MED ORDER — DILTIAZEM HCL ER COATED BEADS 180 MG PO CP24
180.0000 mg | ORAL_CAPSULE | Freq: Every day | ORAL | Status: DC
  Filled 2018-08-17: qty 1

## 2018-08-17 MED ORDER — PREDNISONE 10 MG PO TABS: ORAL_TABLET | ORAL | 0 refills | Status: DC

## 2018-08-17 MED ORDER — AZITHROMYCIN 500 MG PO TABS: 500.0000 mg | ORAL_TABLET | Freq: Every day | ORAL | 0 refills | Status: DC

## 2018-08-17 MED ORDER — POLYETHYLENE GLYCOL 3350 17 G PO PACK: 17.0000 g | PACK | Freq: Every day | ORAL | 0 refills | Status: AC | PRN

## 2018-08-17 MED ORDER — ENSURE ENLIVE PO LIQD: 237.0000 mL | Freq: Two times a day (BID) | ORAL | 0 refills | Status: AC

## 2018-08-17 NOTE — Progress Notes (Signed)
Discharge instructions and prescriptions given to pt and family. Midline IV removed. Catheter intact. No questions from pt. Pt dressed and will discharge home with daughter.

## 2018-08-17 NOTE — Progress Notes (Signed)
[facility]  Next of kin: Audrea Muscat (daughter)  [phone]  If patient discharges after hours, please call [phone].   Florene Glen 08/17/2018, 10:53 AM

## 2018-08-17 NOTE — Care Management (Signed)
Spoke with Brad from [facility], The daughter Velta Addison is refusing to allow the patient to have Respiratory come in to set up the NIV,  I called Dr. Stark Jock and asked if he will call the Daughter and he agreed to call the daughter and explain the need, Dr. Stark Jock called the Daughter and Leroy Sea called the Daughter and explained the need for the NIV.  The daughter has agreed to let the Respiratory from [facility] come into the home and set up the NIV. Brad from [facility] has set up for the Resp therapist to go back to the home and set it up.

## 2018-08-17 NOTE — Discharge Summary (Signed)
[facility] NAME: Earl Clay    MR#:  [id number]  DATE OF BIRTH:  [birth year]  DATE OF ADMISSION:  08/08/2018   ADMITTING PHYSICIAN: Fritzi Mandes, MD  DATE OF DISCHARGE: 08/17/2018  PRIMARY CARE PHYSICIAN: [name], MD   ADMISSION DIAGNOSIS:  Acute respiratory failure with hypoxia ([place]) [J96.01] Sepsis ([place]) [A41.9] Community acquired pneumonia, unspecified laterality [J18.9] DISCHARGE DIAGNOSIS:  Active Problems:   Acute on chronic respiratory failure with hypoxemia ([place])  SECONDARY DIAGNOSIS:   Past Medical History:  Diagnosis Date  . Asthma   . CAD (coronary artery disease)   . COPD (chronic obstructive pulmonary disease) ([place])   . Depression   . Diabetes mellitus without complication ([place])   . Difficult intubation   . GERD (gastroesophageal reflux disease)   . Hypertension   . Lung cancer ([place])   . 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 Pancreatic mass   . PTSD (post-traumatic stress disorder)   . Sleep apnea    CPAP with O2 2L/m at night   HOSPITAL COURSE:  Chief complaint; shortness of breath and cough  History of presenting complaint; Earl Clay  is a 78 y.o. male with a known history of chronic respiratory failure/end-stage emphysema on 5-6 L nasal cannula oxygen, diabetes, CAD, hypertension comes to the emergency room from home with daughter and family members with increasing shortness of breath productive cough and overall not feeling well with generalized weakness. Workup in the ER showed patient was tachycardic and hypoxia with chest x-ray showing multifocal pneumonia. He received IV Rocephin and Zithromax. Patient currently is on BiPAP. Sats drop down into the lower 80s.  Patient was admitted for management of acute on chronic hypoxic respiratory failure secondary to COPD exacerbation and multifocal pneumonia.  Please refer to the H&P dictated for further  details.   Hospital course; 1.Acute on chronic hypoxic respiratory failure secondary to COPD exacerbation: Patient already weaned off BiPAP.  Currently on oxygen via nasal cannula and down to 6 L.  He uses 5 to 6 L of oxygen at home.  Patient was seen by palliative care team recently and comfort care measures was initiated.  Patient discussed with daughter and recently rescinded comfort care measures.  Treatment for COPD exacerbation reinstituted including steroids and antibiotics.  Continue bronchodilators.. Patient significantly improved clinically.  Already dressed up this morning.  Wishes to be discharged home today.  Being discharged on p.o. prednisone taper.  Complete p.o. antibiotics with azithromycin.  Continue bronchodilators. Patient seen by physical therapist.  Recommendation is for home health with physical therapy on discharge.  Patient already has a walker at home.  2. Multifocal pneumonia causing acute on chronic respiratory failure, weaned off the BiPAP.  Requiring oxygen via nasal cannula.  Clinically improved.  Being discharged on p.o. antibiotics to complete treatment duration  3. Sepsis present on admission secondary to pneumonia: Evidence of cough respiratory rate, tachycardia, elevated white count.clinically resolved.  To complete p.o. biotics as outpatient.   4 hypokalemia: Replaced.  5. Diabetes mellitus type 2 ; Blood sugars appear fairly controlled.  6 COPD exacerbation, Treated as outlined above in #1.  Being discharged home on p.o. prednisone taper  7. Essential hypertension: Fairly controlled.:  Resumed home blood pressure medications.  Follow-up with primary care physician for monitoring  8.. History of right lung cancer, has history of emphysema. Prognosis poor, seen by palliative care team and comfort care was initially initiated.  This has been rescinded by patient and daughter.  Treatment resumed as mentioned above.  Being discharged on p.o.  antibiotics to complete treatment duration.  Patient and family understands his current clinical condition and high likelihood of recurrent admissions for the same.  9.. sinus tachycardia: Heart rate better controlled   DISCHARGE CONDITIONS:  Stable CONSULTS OBTAINED:   DRUG ALLERGIES:   Allergies  Allergen Reactions  . Ampicillin Hives and Other (See Comments)    Has patient had a PCN reaction causing immediate rash, facial/tongue/throat swelling, SOB or lightheadedness with hypotension: No Has patient had a PCN reaction causing severe rash involving mucus membranes or skin necrosis: No Has patient had a PCN reaction that required hospitalization No Has patient had a PCN reaction occurring within the last 10 years: No If all of the above answers are "NO", then may proceed with Cephalosporin use.  . Lorazepam     Worsening confusion  . Penicillins Hives and Other (See Comments)    Has patient had a PCN reaction causing immediate rash, facial/tongue/throat swelling, SOB or lightheadedness with hypotension: No Has patient had a PCN reaction causing severe rash involving mucus membranes or skin necrosis: No Has patient had a PCN reaction that required hospitalization No Has patient had a PCN reaction occurring within the last 10 years: No If all of the above answers are "NO", then may proceed with Cephalosporin use.  . Zyban [Bupropion] Rash   DISCHARGE MEDICATIONS:   Allergies as of 08/17/2018      Reactions   Ampicillin Hives, Other (See Comments)   Has patient had a PCN reaction causing immediate rash, facial/tongue/throat swelling, SOB or lightheadedness with hypotension: No Has patient had a PCN reaction causing severe rash involving mucus membranes or skin necrosis: No Has patient had a PCN reaction that required hospitalization No Has patient had a PCN reaction occurring within the last 10 years: No If all of the above answers are "NO", then may proceed with Cephalosporin  use.   Lorazepam    Worsening confusion   Penicillins Hives, Other (See Comments)   Has patient had a PCN reaction causing immediate rash, facial/tongue/throat swelling, SOB or lightheadedness with hypotension: No Has patient had a PCN reaction causing severe rash involving mucus membranes or skin necrosis: No Has patient had a PCN reaction that required hospitalization No Has patient had a PCN reaction occurring within the last 10 years: No If all of the above answers are "NO", then may proceed with Cephalosporin use.   Zyban [bupropion] Rash      Medication List    STOP taking these medications   LORazepam 0.5 MG tablet Commonly known as:  ATIVAN     TAKE these medications   albuterol 108 (90 Base) MCG/ACT inhaler Commonly known as:  PROVENTIL HFA;VENTOLIN HFA Inhale 2 puffs into the lungs 2 (two) times daily.   aspirin 81 MG chewable tablet Chew 81 mg by mouth daily.   azithromycin 500 MG tablet Commonly known as:  ZITHROMAX Take 1 tablet (500 mg total) by mouth daily.   buPROPion 100 MG tablet Commonly known as:  WELLBUTRIN Take 100 mg by mouth 2 (two) times daily.   cetirizine 10 MG tablet Commonly known as:  ZYRTEC Take 10 mg by mouth every evening.   citalopram 20 MG tablet Commonly known as:  CELEXA Take 20 mg by mouth daily.   diltiazem 180 MG 24 hr capsule Commonly known as:  DILACOR XR Take 180 mg by mouth daily.  enalapril 5 MG tablet Commonly known as:  VASOTEC Take 5 mg by mouth daily.   feeding supplement (ENSURE ENLIVE) Liqd Take 237 mLs by mouth 2 (two) times daily between meals.   furosemide 20 MG tablet Commonly known as:  LASIX Take 20 mg by mouth daily.   hydrochlorothiazide 25 MG tablet Commonly known as:  HYDRODIURIL Take 25 mg by mouth daily.   hydrOXYzine 10 MG tablet Commonly known as:  ATARAX/VISTARIL Take 10 mg by mouth at bedtime.   insulin aspart 100 UNIT/ML injection Commonly known as:  novoLOG Inject 10-20 Units into  the skin 3 (three) times daily with meals as needed for high blood sugar. Pt uses as needed per sliding scale.   insulin detemir 100 UNIT/ML injection Commonly known as:  LEVEMIR Inject 20 Units into the skin daily.   magnesium oxide 400 MG tablet Commonly known as:  MAG-OX Take 400 mg by mouth daily.   PARoxetine 40 MG tablet Commonly known as:  PAXIL Take 40 mg by mouth 2 (two) times daily.   polyethylene glycol packet Commonly known as:  MIRALAX / GLYCOLAX Take 17 g by mouth daily as needed for mild constipation.   pravastatin 80 MG tablet Commonly known as:  PRAVACHOL Take 80 mg by mouth at bedtime.   predniSONE 10 MG tablet Commonly known as:  DELTASONE 40mg  daily for 3 days  Then 30mg  daily for 3 days Then 20mg  daily for 3 days Then 10mg  daily for 3 days   sildenafil 100 MG tablet Commonly known as:  VIAGRA Take 100 mg by mouth daily as needed for erectile dysfunction.   tamsulosin 0.4 MG Caps capsule Commonly known as:  FLOMAX Take 0.4 mg by mouth at bedtime.   theophylline 300 MG 24 hr capsule Commonly known as:  THEO-24 Take 300 mg by mouth 2 (two) times daily.   tiotropium 18 MCG inhalation capsule Commonly known as:  SPIRIVA Place 18 mcg into inhaler and inhale daily.   tiZANidine 4 MG tablet Commonly known as:  ZANAFLEX Take 4 mg by mouth at bedtime.   traZODone 100 MG tablet Commonly known as:  DESYREL Take 100 mg by mouth at bedtime.        DISCHARGE INSTRUCTIONS:   DIET:  Cardiac diet DISCHARGE CONDITION:  Stable ACTIVITY:  Activity as tolerated OXYGEN:  Home Oxygen: Yes.    Oxygen Delivery: 5-6 liters/min via Patient connected to nasal cannula oxygen DISCHARGE LOCATION:  home   If you experience worsening of your admission symptoms, develop shortness of breath, life threatening emergency, suicidal or homicidal thoughts you must seek medical attention immediately by calling 911 or calling your MD immediately  if symptoms less  severe.  You Must read complete instructions/literature along with all the possible adverse reactions/side effects for all the Medicines you take and that have been prescribed to you. Take any new Medicines after you have completely understood and accpet all the possible adverse reactions/side effects.   Please note  You were cared for by a hospitalist during your hospital stay. If you have any questions about your discharge medications or the care you received while you were in the hospital after you are discharged, you can call the unit and asked to speak with the hospitalist on call if the hospitalist that took care of you is not available. Once you are discharged, your primary care physician will handle any further medical issues. Please note that NO REFILLS for any discharge medications will be authorized once you  are discharged, as it is imperative that you return to your primary care physician (or establish a relationship with a primary care physician if you do not have one) for your aftercare needs so that they can reassess your need for medications and monitor your lab values.    On the day of Discharge:  VITAL SIGNS:  Blood pressure (!) 164/87, pulse (!) 105, temperature 98 F (36.7 C), temperature source Oral, resp. rate (!) 22, height 5\' 6"  (1.676 m), weight 71.4 kg, SpO2 97 %. PHYSICAL EXAMINATION:  GENERAL:  78 y.o.-year-old patient lying in the bed with no acute distress.  EYES: Pupils equal, round, reactive to light and accommodation. No scleral icterus. Extraocular muscles intact.  HEENT: Head atraumatic, normocephalic. Oropharynx and nasopharynx clear.  NECK:  Supple, no jugular venous distention. No thyroid enlargement, no tenderness.  LUNGS: Normal breath sounds bilaterally, no wheezing, rales,rhonchi or crepitation. No use of accessory muscles of respiration.  CARDIOVASCULAR: S1, S2 normal. No murmurs, rubs, or gallops.  ABDOMEN: Soft, non-tender, non-distended. Bowel sounds  present. No organomegaly or mass.  EXTREMITIES: No pedal edema, cyanosis, or clubbing.  NEUROLOGIC: Cranial nerves II through XII are intact. Muscle strength 5/5 in all extremities. Sensation intact. Gait not checked.  PSYCHIATRIC: The patient is alert and oriented x 3.  SKIN: No obvious rash, lesion, or ulcer.  DATA REVIEW:   CBC No results for input(s): WBC, HGB, HCT, PLT in the last 168 hours.  Chemistries  Recent Labs  Lab 08/16/18 0443  NA 135  K 3.5  CL 89*  CO2 40*  GLUCOSE 212*  BUN 22  CREATININE 0.87  CALCIUM 9.0  MG 1.8     Microbiology Results  Results for orders placed or performed during the hospital encounter of 08/08/18  Culture, blood (Routine x 2)     Status: None   Collection Time: 08/08/18 12:05 PM  Result Value Ref Range Status   Specimen Description BLOOD BLOOD RIGHT FOREARM  Final   Special Requests   Final    BOTTLES DRAWN AEROBIC AND ANAEROBIC Blood Culture adequate volume   Culture   Final    NO GROWTH 5 DAYS Performed at [facility], [address] [place], [place] [address]    Report Status 08/13/2018 FINAL  Final  Culture, blood (Routine x 2)     Status: None   Collection Time: 08/08/18 12:10 PM  Result Value Ref Range Status   Specimen Description BLOOD LEFT ANTECUBITAL  Final   Special Requests   Final    BOTTLES DRAWN AEROBIC AND ANAEROBIC LEFT ANTECUBITAL   Culture   Final    NO GROWTH 5 DAYS Performed at [facility], [address] [place], [place] [address]    Report Status 08/13/2018 FINAL  Final  MRSA PCR Screening     Status: None   Collection Time: 08/08/18  4:50 PM  Result Value Ref Range Status   MRSA by PCR NEGATIVE NEGATIVE Final    Comment:        The GeneXpert MRSA Assay (FDA approved for NASAL specimens only), is one component of a comprehensive MRSA colonization surveillance program. It is not intended to diagnose MRSA infection nor to guide or monitor treatment for MRSA  infections. Performed at [facility], [address] [place], [place] [address]   Urine Culture     Status: None   Collection Time: 08/08/18 10:47 PM  Result Value Ref Range Status   Specimen Description   Final  URINE, RANDOM Performed at [facility], [address], [place], [place] [address]    Special Requests   Final    NONE Performed at [facility], [address], [place], [place] [address]    Culture   Final    NO GROWTH Performed at [facility], [place] [address], [place], [place] [address]    Report Status 08/10/2018 FINAL  Final    RADIOLOGY:  No results found.   Management plans discussed with the patient, family and they are in agreement.  CODE STATUS: DNR   TOTAL TIME TAKING CARE OF THIS PATIENT: 37 minutes.    Shakari Qazi M.D on 08/17/2018 at 9:44 AM  Between 7am to 6pm - Pager - [phone]  After 6pm go to [email or web address] - Proofreader  [facility]  Office  [phone]  CC: Primary care physician; [name], MD   Note: This dictation was prepared with Dragon dictation along with smaller phrase technology. Any transcriptional errors that result from this process are unintentional.

## 2018-08-18 LAB — BLOOD GAS, ARTERIAL
Acid-Base Excess: 8.3 mmol/L — ABNORMAL HIGH (ref 0.0–2.0)
Allens test (pass/fail): POSITIVE — AB
Bicarbonate: 33.4 mmol/L — ABNORMAL HIGH (ref 20.0–28.0)
FIO2: 100
O2 Saturation: 96 %
Patient temperature: 37
pCO2 arterial: 47 mmHg (ref 32.0–48.0)
pH, Arterial: 7.46 — ABNORMAL HIGH (ref 7.350–7.450)
pO2, Arterial: 77 mmHg — ABNORMAL LOW (ref 83.0–108.0)

## 2018-08-20 ENCOUNTER — Telehealth: Payer: Self-pay

## 2018-08-20 NOTE — Telephone Encounter (Signed)
EMMI Follow-up: Noted on the report that the patient hadn't read his discharge papers yet, didn't know who to call if there was a change in his condition, and wasn't sure about new Rx's.  I called to talk with Earl Clay and he was talking with representative regarding the CPAP machine and  asked me to speak with Earl Clay (his emergency contact).  Earl Clay stays with him and takes him to all appointments.  Said Earl Clay was having a better day today.  She had taken him to follow-up with Earl Clay on 08/19/18 and he done another chest x-ray and more lab work.  Will call him when he gets the results. He prescribed some medication to help him sleep as he wasn't able to sleep.  I let her know that he would receive a second automated call and to let us know if he had any concerns at that time.

## 2018-09-17 ENCOUNTER — Other Ambulatory Visit: Payer: Self-pay

## 2018-09-17 ENCOUNTER — Other Ambulatory Visit: Payer: Medicare Other | Admitting: Primary Care

## 2018-09-17 DIAGNOSIS — J9621 Acute and chronic respiratory failure with hypoxia: Secondary | ICD-10-CM

## 2018-09-17 DIAGNOSIS — E440 Moderate protein-calorie malnutrition: Secondary | ICD-10-CM

## 2018-09-17 DIAGNOSIS — J441 Chronic obstructive pulmonary disease with (acute) exacerbation: Secondary | ICD-10-CM

## 2018-09-17 DIAGNOSIS — Z794 Long term (current) use of insulin: Secondary | ICD-10-CM

## 2018-09-17 DIAGNOSIS — E1165 Type 2 diabetes mellitus with hyperglycemia: Secondary | ICD-10-CM

## 2018-09-17 DIAGNOSIS — Z515 Encounter for palliative care: Secondary | ICD-10-CM

## 2018-09-17 NOTE — Progress Notes (Signed)
[facility] Consult Note Telephone: [phone]  Fax: [phone]  PATIENT NAME: Earl Clay DOB: 1941-05-24 MRN: [id number]  PRIMARY CARE PROVIDER:   [name], MD  REFERRING PROVIDER:  [name], MD [place], [place] [address]  RESPONSIBLE PARTY:   Extended Emergency Contact Information Primary Emergency Contact: Earl Clay, [place] [address] Earl Clay of [place] Phone: [phone] Relation: Friend Secondary Emergency Contact: Earl Clay Mobile Phone: [phone] Relation: Daughter  Palliative care was asked to provide consultation for patient. This is the initial visit. Met with patient and SO Earl Clay. Daughters have POA.  ASSESSMENT and RECOMMENDATIONS:   1. Goals of Care: DNR, MOSt with DNR, hospitalization, IV, ABX, trial feeding tube.  Completed MOST and left in home. Goal is "Live as long as I can", stopped smoking 10 years.  Recent hospitalization, was given option to withdraw care at hospital. Recounts distress at this plan of care and does not wish to have hospice approach at this time. Palliative described as maximizing quality of life and not withdrawing any desired care that would be beneficial to patient. He is going to have f/u scan for lung cancer(dx 3 years ago) in a few weeks (staging post radiation). We discussed any future cancer therapy. He states he's not a surgical or chemo candidate.  2. Dyspnea: Recommend scheduling albuterol nebulizer bid or tid. Uses oxygen at 4 liters, has abluterol nebulizer as needed, and has bipap for sleep. Widespread wheezes auscultated, and pt is assuming tripod position to breathe. States he knows his disease can't be cured and that it is progressive.   3. Life Review:Patient is a veteran of [employer], spent 4 years in [place]. We discussed end of life needs of veterans. [facility] has a  veteran care program which could benefit patient if and when he transitions to hospice. This was discussed with patient as well.   4. Chronic disease: Continue to monitor several processes. States DM is stable A1C of 7 per pt report, could  Not locate in labs. Has pancreatic mass per chart, and will have CT to reassess effects of radiation for lung ca, upcoming in several weeks.   Palliative Care will follow for goals of care clarification, symptom management. Instructed to call PCP for acute care. Return 4 weeks or PRN.   I spent 60 minutes providing this consultation,  from 1000 to 1100. More than 50% of the time in this consultation was spent coordinating communication.   HISTORY OF PRESENT ILLNESS:  Earl Clay is a 78 y.o. year old male with multiple medical problems including Lung cancer, COPD, tobacco abuse, pancreatic mass, DM2, insulin dependence . Palliative Care was asked to help address goals of care.   CODE STATUS: DNR,   PPS: 50% HOSPICE ELIGIBILITY/DIAGNOSIS: TBD  PAST MEDICAL HISTORY:  Past Medical History:  Diagnosis Date   Asthma    CAD (coronary artery disease)    COPD (chronic obstructive pulmonary disease) ([place])    Depression    Diabetes mellitus without complication ([place])    Difficult intubation    GERD (gastroesophageal reflux disease)    Hypertension    Lung cancer ([place])    Oxygen dependent    2-2.5L/m continuously   Pancreatic mass    PTSD (post-traumatic stress disorder)    Sleep apnea    CPAP with O2 2L/m at night    SOCIAL  HX:  Social History   Tobacco Use   Smoking status: Former Smoker   Smokeless tobacco: Never Used  Substance Use Topics   Alcohol use: Not on file    ALLERGIES:  Allergies  Allergen Reactions   Ampicillin Hives and Other (See Comments)    Has patient had a PCN reaction causing immediate rash, facial/tongue/throat swelling, SOB or lightheadedness with hypotension: No Has patient had a PCN reaction  causing severe rash involving mucus membranes or skin necrosis: No Has patient had a PCN reaction that required hospitalization No Has patient had a PCN reaction occurring within the last 10 years: No If all of the above answers are "NO", then may proceed with Cephalosporin use.   Lorazepam     Worsening confusion   Penicillins Hives and Other (See Comments)    Has patient had a PCN reaction causing immediate rash, facial/tongue/throat swelling, SOB or lightheadedness with hypotension: No Has patient had a PCN reaction causing severe rash involving mucus membranes or skin necrosis: No Has patient had a PCN reaction that required hospitalization No Has patient had a PCN reaction occurring within the last 10 years: No If all of the above answers are "NO", then may proceed with Cephalosporin use.   Zyban [Bupropion] Rash     PERTINENT MEDICATIONS:  Outpatient Encounter Medications as of 09/17/2018  Medication Sig   albuterol (PROVENTIL HFA;VENTOLIN HFA) 108 (90 Base) MCG/ACT inhaler Inhale 2 puffs into the lungs 2 (two) times daily.    aspirin 81 MG chewable tablet Chew 81 mg by mouth daily.   azithromycin (ZITHROMAX) 500 MG tablet Take 1 tablet (500 mg total) by mouth daily.   buPROPion (WELLBUTRIN) 100 MG tablet Take 100 mg by mouth 2 (two) times daily.   cetirizine (ZYRTEC) 10 MG tablet Take 10 mg by mouth every evening.    citalopram (CELEXA) 20 MG tablet Take 20 mg by mouth daily.    diltiazem (DILACOR XR) 180 MG 24 hr capsule Take 180 mg by mouth daily.   enalapril (VASOTEC) 5 MG tablet Take 5 mg by mouth daily.    feeding supplement, ENSURE ENLIVE, (ENSURE ENLIVE) LIQD Take 237 mLs by mouth 2 (two) times daily between meals.   furosemide (LASIX) 20 MG tablet Take 20 mg by mouth daily.    hydrochlorothiazide (HYDRODIURIL) 25 MG tablet Take 25 mg by mouth daily.    hydrOXYzine (ATARAX/VISTARIL) 10 MG tablet Take 10 mg by mouth at bedtime.   insulin aspart (NOVOLOG) 100  UNIT/ML injection Inject 10-20 Units into the skin 3 (three) times daily with meals as needed for high blood sugar. Pt uses as needed per sliding scale.    insulin detemir (LEVEMIR) 100 UNIT/ML injection Inject 20 Units into the skin daily.    magnesium oxide (MAG-OX) 400 MG tablet Take 400 mg by mouth daily.    PARoxetine (PAXIL) 40 MG tablet Take 40 mg by mouth 2 (two) times daily.    polyethylene glycol (MIRALAX / GLYCOLAX) packet Take 17 g by mouth daily as needed for mild constipation.   pravastatin (PRAVACHOL) 80 MG tablet Take 80 mg by mouth at bedtime.    predniSONE (DELTASONE) 10 MG tablet '40mg'$  daily for 3 days  Then '30mg'$  daily for 3 days Then '20mg'$  daily for 3 days Then '10mg'$  daily for 3 days   sildenafil (VIAGRA) 100 MG tablet Take 100 mg by mouth daily as needed for erectile dysfunction.    tamsulosin (FLOMAX) 0.4 MG CAPS capsule Take 0.4 mg by  mouth at bedtime.   theophylline (THEO-24) 300 MG 24 hr capsule Take 300 mg by mouth 2 (two) times daily.    tiotropium (SPIRIVA) 18 MCG inhalation capsule Place 18 mcg into inhaler and inhale daily.    tiZANidine (ZANAFLEX) 4 MG tablet Take 4 mg by mouth at bedtime.   traZODone (DESYREL) 100 MG tablet Take 100 mg by mouth at bedtime.   No facility-administered encounter medications on file as of 09/17/2018.     PHYSICAL EXAM:  Wt 155 lbs. ( was 220 lb 5 years ago).  98.4-107-24   94% 4 liters General: NAD, frail appearing,  Appetite good. Cardiovascular: regular rate and rhythm, S1S2 Pulmonary: wheezes all fields, tripod position, DOE, SOB at rest.Oxygen dependent. Abdomen: soft, nontender, + bowel sounds, denies constipation Extremities: no edema, no joint deformities Skin: no rashes, no wounds Neurological: Weakness but otherwise nonfocal, ambulates without assistance.  Cyndia Skeeters DNP, AGPCNP-BC

## 2018-10-04 ENCOUNTER — Ambulatory Visit: Payer: Medicare Other

## 2018-10-06 ENCOUNTER — Inpatient Hospital Stay: Payer: Medicare Other

## 2018-10-06 ENCOUNTER — Inpatient Hospital Stay: Payer: Medicare Other | Admitting: Internal Medicine

## 2018-10-22 ENCOUNTER — Telehealth: Payer: Self-pay | Admitting: *Deleted

## 2018-10-22 NOTE — Telephone Encounter (Signed)
Patient is requesting clarification of upcoming appointments, would like to know if he needs to keep CT scan and follow up as scheduled next week. Thanks.

## 2018-10-22 NOTE — Telephone Encounter (Signed)
Attempted to return pt's phone call. No answer. Phone just rings. Unable to leave vm.  Patient needs to keep apts as scheduled.

## 2018-10-25 NOTE — Telephone Encounter (Signed)
Patient left vm asking for a return call on his appts.  Patient did not know about CT scan appt.  Told him date/time/location of CT scan and appt with Dr Rogue Bussing.

## 2018-10-26 ENCOUNTER — Other Ambulatory Visit: Payer: Self-pay

## 2018-10-26 ENCOUNTER — Ambulatory Visit
Admission: RE | Admit: 2018-10-26 | Discharge: 2018-10-26 | Disposition: A | Discharge status: Home / Self Care | Payer: Medicare Other | Source: Ambulatory Visit | Attending: Internal Medicine | Admitting: Internal Medicine

## 2018-10-26 DIAGNOSIS — C3412 Malignant neoplasm of upper lobe, left bronchus or lung: Secondary | ICD-10-CM

## 2018-10-27 ENCOUNTER — Other Ambulatory Visit: Payer: Self-pay

## 2018-10-27 ENCOUNTER — Inpatient Hospital Stay: Payer: Medicare Other | Attending: Internal Medicine | Admitting: Internal Medicine

## 2018-10-27 ENCOUNTER — Other Ambulatory Visit: Payer: Medicare Other

## 2018-10-27 DIAGNOSIS — C3412 Malignant neoplasm of upper lobe, left bronchus or lung: Secondary | ICD-10-CM

## 2018-10-27 NOTE — Assessment & Plan Note (Addendum)
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  Would recommend a PET scan for further evaluation.  #I am doubtful if radiation is still a possibility if there is true recurrence based on the PET scan.  Other options include chemotherapy/immunotherapy.  #Right upper lobe lung nodule-s/p SBRT September 2018 stable.  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 [Dr.Fleming].   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 [Dr.Lateef]   # descending colon uptake- ? Malignancy; had previous colo [2-3 years].  Stable.   #Disposition: #PET scan ASAP; # follow-up with me next week/Friday in clinic; # labs-cbc/cmp-Dr.B  Cc; Dr.Sparks/Fleming

## 2018-10-27 NOTE — Progress Notes (Signed)
I connected with Earl Clay on 10/27/18 at 10:15 AM EDT by video enabled telemedicine visit and verified that I am speaking with the correct person using two identifiers.  I discussed the limitations, risks, security and privacy concerns of performing an evaluation and management service by telemedicine and the availability of in-person appointments. I also discussed with the patient that there may be a patient responsible charge related to this service. The patient expressed understanding and agreed to proceed.    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  2.Patient was evaluated because of abnormal CT scan  dated March 15 of 2016A she had a 6 mm right upper lobe pulmonary nodule which has likely an largest and I will up since last CT scan of 2014 Patient has previously treated left lung cancer with radiation changes 3.  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  Repeat CT scan revealed complete resolution of  Pancreatic  Mass. (April of 2017) # Difficulty intubation [eval by Dr.McQueen; no neck mass]  # AUG 2018- RUL nodule- ~15-35mm- slowly growing; s/p SBRT [finished sep 2018];  # April 2020- CT- ? Left hilar recurrence  # Colon mass/polyp [No Bx sec to respi. 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  male with history of lung cancer severe COPD.  Patient is  currently on surveillance/review of CT scan.  Patient has chronic shortness of breath chronic cough.  Continues to be on 4 to 6 L of oxygen.  No hemoptysis.  No new onset of chest pain or headaches.  Observation/objective: CT scan April 2020 shows-increasing left hilar infiltrative lesion concerning for local recurrence/progression.  Right upper lobe lung nodule stable.  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  Would recommend a PET scan for further evaluation.  #I am doubtful if radiation is still a possibility if there is true recurrence based on the PET scan.  Other options include chemotherapy/immunotherapy.  #Right upper lobe lung nodule-s/p SBRT September 2018 stable.  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 [Dr.Fleming].   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 [Dr.Lateef]   # descending colon uptake- ? Malignancy; had previous colo [2-3 years].  Stable.   #Disposition: #PET scan ASAP; # follow-up with me next week/Friday in clinic; # labs-cbc/cmp-Dr.B  Cc; Dr.Sparks/Fleming    Follow-up instructions:  I discussed the assessment and treatment plan with the patient.  The patient was provided an opportunity to ask questions and all were answered.  The patient agreed with the plan and demonstrated understanding of instructions.  The patient was advised to call back or seek an in person evaluation if the symptoms worsen or if the condition fails to improve as anticipated.  Dr. Charlaine Dalton [place] at [facility]  [facility] 10/27/2018 11:05 AM

## 2018-10-31 ENCOUNTER — Emergency Department: Payer: Medicare Other

## 2018-10-31 ENCOUNTER — Other Ambulatory Visit: Payer: Self-pay

## 2018-10-31 ENCOUNTER — Inpatient Hospital Stay
Admission: EM | Admit: 2018-10-31 | Discharge: 2018-11-09 | DRG: 190 | Disposition: A | Discharge status: Hospice | Payer: Medicare Other | Attending: Internal Medicine | Admitting: Internal Medicine

## 2018-10-31 DIAGNOSIS — N179 Acute kidney failure, unspecified: Secondary | ICD-10-CM | POA: Diagnosis present

## 2018-10-31 DIAGNOSIS — J441 Chronic obstructive pulmonary disease with (acute) exacerbation: Secondary | ICD-10-CM | POA: Diagnosis present

## 2018-10-31 DIAGNOSIS — E876 Hypokalemia: Secondary | ICD-10-CM | POA: Diagnosis present

## 2018-10-31 DIAGNOSIS — I509 Heart failure, unspecified: Secondary | ICD-10-CM | POA: Diagnosis present

## 2018-10-31 DIAGNOSIS — Z79899 Other long term (current) drug therapy: Secondary | ICD-10-CM

## 2018-10-31 DIAGNOSIS — E1122 Type 2 diabetes mellitus with diabetic chronic kidney disease: Secondary | ICD-10-CM | POA: Diagnosis present

## 2018-10-31 DIAGNOSIS — N183 Chronic kidney disease, stage 3 (moderate): Secondary | ICD-10-CM | POA: Diagnosis present

## 2018-10-31 DIAGNOSIS — Z7189 Other specified counseling: Secondary | ICD-10-CM | POA: Diagnosis not present

## 2018-10-31 DIAGNOSIS — I2510 Atherosclerotic heart disease of native coronary artery without angina pectoris: Secondary | ICD-10-CM | POA: Diagnosis present

## 2018-10-31 DIAGNOSIS — J440 Chronic obstructive pulmonary disease with acute lower respiratory infection: Principal | ICD-10-CM | POA: Diagnosis present

## 2018-10-31 DIAGNOSIS — E872 Acidosis, unspecified: Secondary | ICD-10-CM

## 2018-10-31 DIAGNOSIS — C3412 Malignant neoplasm of upper lobe, left bronchus or lung: Secondary | ICD-10-CM | POA: Diagnosis present

## 2018-10-31 DIAGNOSIS — I160 Hypertensive urgency: Secondary | ICD-10-CM | POA: Diagnosis present

## 2018-10-31 DIAGNOSIS — Z515 Encounter for palliative care: Secondary | ICD-10-CM

## 2018-10-31 DIAGNOSIS — J189 Pneumonia, unspecified organism: Secondary | ICD-10-CM | POA: Diagnosis present

## 2018-10-31 DIAGNOSIS — J9602 Acute respiratory failure with hypercapnia: Secondary | ICD-10-CM | POA: Diagnosis not present

## 2018-10-31 DIAGNOSIS — I130 Hypertensive heart and chronic kidney disease with heart failure and stage 1 through stage 4 chronic kidney disease, or unspecified chronic kidney disease: Secondary | ICD-10-CM | POA: Diagnosis present

## 2018-10-31 DIAGNOSIS — G4733 Obstructive sleep apnea (adult) (pediatric): Secondary | ICD-10-CM | POA: Diagnosis present

## 2018-10-31 DIAGNOSIS — N400 Enlarged prostate without lower urinary tract symptoms: Secondary | ICD-10-CM | POA: Diagnosis present

## 2018-10-31 DIAGNOSIS — I10 Essential (primary) hypertension: Secondary | ICD-10-CM

## 2018-10-31 DIAGNOSIS — R0602 Shortness of breath: Secondary | ICD-10-CM

## 2018-10-31 DIAGNOSIS — J9600 Acute respiratory failure, unspecified whether with hypoxia or hypercapnia: Secondary | ICD-10-CM

## 2018-10-31 DIAGNOSIS — K219 Gastro-esophageal reflux disease without esophagitis: Secondary | ICD-10-CM | POA: Diagnosis present

## 2018-10-31 DIAGNOSIS — J9601 Acute respiratory failure with hypoxia: Secondary | ICD-10-CM | POA: Diagnosis not present

## 2018-10-31 DIAGNOSIS — Z794 Long term (current) use of insulin: Secondary | ICD-10-CM

## 2018-10-31 DIAGNOSIS — J9621 Acute and chronic respiratory failure with hypoxia: Secondary | ICD-10-CM | POA: Diagnosis present

## 2018-10-31 DIAGNOSIS — Z20828 Contact with and (suspected) exposure to other viral communicable diseases: Secondary | ICD-10-CM | POA: Diagnosis present

## 2018-10-31 DIAGNOSIS — C3411 Malignant neoplasm of upper lobe, right bronchus or lung: Secondary | ICD-10-CM | POA: Diagnosis present

## 2018-10-31 DIAGNOSIS — Z9981 Dependence on supplemental oxygen: Secondary | ICD-10-CM | POA: Diagnosis not present

## 2018-10-31 DIAGNOSIS — E860 Dehydration: Secondary | ICD-10-CM | POA: Diagnosis present

## 2018-10-31 DIAGNOSIS — Z87891 Personal history of nicotine dependence: Secondary | ICD-10-CM

## 2018-10-31 DIAGNOSIS — K639 Disease of intestine, unspecified: Secondary | ICD-10-CM | POA: Diagnosis present

## 2018-10-31 DIAGNOSIS — G9341 Metabolic encephalopathy: Secondary | ICD-10-CM | POA: Diagnosis present

## 2018-10-31 DIAGNOSIS — Z66 Do not resuscitate: Secondary | ICD-10-CM | POA: Diagnosis present

## 2018-10-31 DIAGNOSIS — Z923 Personal history of irradiation: Secondary | ICD-10-CM

## 2018-10-31 DIAGNOSIS — Z880 Allergy status to penicillin: Secondary | ICD-10-CM

## 2018-10-31 DIAGNOSIS — Z9221 Personal history of antineoplastic chemotherapy: Secondary | ICD-10-CM

## 2018-10-31 DIAGNOSIS — Z7982 Long term (current) use of aspirin: Secondary | ICD-10-CM

## 2018-10-31 DIAGNOSIS — J9811 Atelectasis: Secondary | ICD-10-CM | POA: Diagnosis present

## 2018-10-31 DIAGNOSIS — Z8249 Family history of ischemic heart disease and other diseases of the circulatory system: Secondary | ICD-10-CM

## 2018-10-31 DIAGNOSIS — Z9119 Patient's noncompliance with other medical treatment and regimen: Secondary | ICD-10-CM

## 2018-10-31 DIAGNOSIS — J9620 Acute and chronic respiratory failure, unspecified whether with hypoxia or hypercapnia: Secondary | ICD-10-CM | POA: Diagnosis not present

## 2018-10-31 DIAGNOSIS — F4310 Post-traumatic stress disorder, unspecified: Secondary | ICD-10-CM | POA: Diagnosis present

## 2018-10-31 DIAGNOSIS — Z888 Allergy status to other drugs, medicaments and biological substances status: Secondary | ICD-10-CM

## 2018-10-31 LAB — TROPONIN I: Troponin I: 0.08 ng/mL (ref <0.03)

## 2018-10-31 LAB — CBC WITH DIFFERENTIAL/PLATELET
Abs Immature Granulocytes: 0.07 10*3/uL (ref 0.00–0.07)
Basophils Absolute: 0.1 10*3/uL (ref 0.0–0.1)
Basophils Relative: 1 %
Eosinophils Absolute: 0.2 10*3/uL (ref 0.0–0.5)
Eosinophils Relative: 1 %
HCT: 44.2 % (ref 39.0–52.0)
Hemoglobin: 13.3 g/dL (ref 13.0–17.0)
Immature Granulocytes: 0 %
Lymphocytes Relative: 15 %
Lymphs Abs: 2.4 10*3/uL (ref 0.7–4.0)
MCH: 27.1 pg (ref 26.0–34.0)
MCHC: 30.1 g/dL (ref 30.0–36.0)
MCV: 90 fL (ref 80.0–100.0)
Monocytes Absolute: 1.2 10*3/uL — ABNORMAL HIGH (ref 0.1–1.0)
Monocytes Relative: 8 %
Neutro Abs: 11.8 10*3/uL — ABNORMAL HIGH (ref 1.7–7.7)
Neutrophils Relative %: 75 %
Platelets: 312 10*3/uL (ref 150–400)
RBC: 4.91 MIL/uL (ref 4.22–5.81)
RDW: 17.4 % — ABNORMAL HIGH (ref 11.5–15.5)
WBC: 15.8 10*3/uL — ABNORMAL HIGH (ref 4.0–10.5)
nRBC: 0 % (ref 0.0–0.2)

## 2018-10-31 LAB — LACTIC ACID, PLASMA
Lactic Acid, Venous: 2.6 mmol/L (ref 0.5–1.9)
Lactic Acid, Venous: 2.3 mmol/L (ref 0.5–1.9)

## 2018-10-31 LAB — GLUCOSE, CAPILLARY
Glucose-Capillary: 204 mg/dL — ABNORMAL HIGH (ref 70–99)
Glucose-Capillary: 205 mg/dL — ABNORMAL HIGH (ref 70–99)

## 2018-10-31 LAB — BASIC METABOLIC PANEL
Anion gap: 14 (ref 5–15)
BUN: 20 mg/dL (ref 8–23)
CO2: 28 mmol/L (ref 22–32)
Calcium: 9.6 mg/dL (ref 8.9–10.3)
Chloride: 97 mmol/L — ABNORMAL LOW (ref 98–111)
Creatinine, Ser: 1.16 mg/dL (ref 0.61–1.24)
GFR calc Af Amer: >60 mL/min (ref >60)
GFR calc non Af Amer: >60 mL/min (ref >60)
Glucose, Bld: 196 mg/dL — ABNORMAL HIGH (ref 70–99)
Potassium: 3.7 mmol/L (ref 3.5–5.1)
Sodium: 139 mmol/L (ref 135–145)

## 2018-10-31 LAB — SARS CORONAVIRUS 2 BY RT PCR (HOSPITAL ORDER, PERFORMED IN ~~LOC~~ HOSPITAL LAB): SARS Coronavirus 2: NEGATIVE

## 2018-10-31 MED ORDER — ENOXAPARIN SODIUM 40 MG/0.4ML ~~LOC~~ SOLN
40.0000 mg | SUBCUTANEOUS | Status: DC
  Administered 2018-10-31 – 2018-11-02 (×3): 40 mg via SUBCUTANEOUS
  Filled 2018-10-31 (×3): qty 0.4

## 2018-10-31 MED ORDER — SODIUM CHLORIDE 0.9 % IV SOLN
1.0000 g | Freq: Once | INTRAVENOUS | Status: AC
  Administered 2018-10-31: 20:00:00 1 g via INTRAVENOUS
  Filled 2018-10-31: qty 10

## 2018-10-31 MED ORDER — SODIUM CHLORIDE 0.9 % IV SOLN
500.0000 mg | Freq: Once | INTRAVENOUS | Status: AC
  Administered 2018-10-31: 20:00:00 500 mg via INTRAVENOUS
  Filled 2018-10-31: qty 500

## 2018-10-31 MED ORDER — IPRATROPIUM-ALBUTEROL 0.5-2.5 (3) MG/3ML IN SOLN
3.0000 mL | Freq: Four times a day (QID) | RESPIRATORY_TRACT | Status: DC
  Administered 2018-11-01 – 2018-11-03 (×11): 3 mL via RESPIRATORY_TRACT
  Filled 2018-10-31 (×11): qty 3

## 2018-10-31 MED ORDER — SODIUM CHLORIDE 0.9% FLUSH: 3.0000 mL | INTRAVENOUS | Status: DC | PRN

## 2018-10-31 MED ORDER — INSULIN ASPART 100 UNIT/ML ~~LOC~~ SOLN
0.0000 [IU] | SUBCUTANEOUS | Status: DC
  Administered 2018-11-01: 5 [IU] via SUBCUTANEOUS
  Administered 2018-11-01: 2 [IU] via SUBCUTANEOUS
  Administered 2018-11-01 (×3): 3 [IU] via SUBCUTANEOUS
  Administered 2018-11-01: 5 [IU] via SUBCUTANEOUS
  Administered 2018-11-02: 15 [IU] via SUBCUTANEOUS
  Administered 2018-11-02: 5 [IU] via SUBCUTANEOUS
  Administered 2018-11-02: 8 [IU] via SUBCUTANEOUS
  Administered 2018-11-02: 5 [IU] via SUBCUTANEOUS
  Administered 2018-11-02 – 2018-11-03 (×3): 3 [IU] via SUBCUTANEOUS
  Administered 2018-11-03: 12:00:00 15 [IU] via SUBCUTANEOUS
  Administered 2018-11-03: 8 [IU] via SUBCUTANEOUS
  Administered 2018-11-03 – 2018-11-04 (×3): 3 [IU] via SUBCUTANEOUS
  Administered 2018-11-04: 04:00:00 2 [IU] via SUBCUTANEOUS
  Administered 2018-11-04: 11 [IU] via SUBCUTANEOUS
  Administered 2018-11-04: 3 [IU] via SUBCUTANEOUS
  Administered 2018-11-04: 2 [IU] via SUBCUTANEOUS
  Administered 2018-11-04: 8 [IU] via SUBCUTANEOUS
  Administered 2018-11-05 (×4): 3 [IU] via SUBCUTANEOUS
  Administered 2018-11-06 – 2018-11-08 (×5): 2 [IU] via SUBCUTANEOUS
  Administered 2018-11-08: 5 [IU] via SUBCUTANEOUS
  Administered 2018-11-08 (×2): 2 [IU] via SUBCUTANEOUS
  Administered 2018-11-08: 3 [IU] via SUBCUTANEOUS
  Administered 2018-11-09 (×2): 2 [IU] via SUBCUTANEOUS
  Administered 2018-11-09: 5 [IU] via SUBCUTANEOUS
  Filled 2018-10-31 (×36): qty 1

## 2018-10-31 MED ORDER — MORPHINE SULFATE (PF) 2 MG/ML IV SOLN
2.0000 mg | INTRAVENOUS | Status: DC | PRN
  Administered 2018-11-01 – 2018-11-03 (×2): 2 mg via INTRAVENOUS
  Filled 2018-10-31 (×2): qty 1

## 2018-10-31 MED ORDER — SODIUM CHLORIDE 0.9% FLUSH
3.0000 mL | Freq: Two times a day (BID) | INTRAVENOUS | Status: DC
  Administered 2018-10-31 – 2018-11-09 (×16): 3 mL via INTRAVENOUS

## 2018-10-31 MED ORDER — SODIUM CHLORIDE 0.9 % IV SOLN
500.0000 mg | INTRAVENOUS | Status: DC
  Administered 2018-11-01 – 2018-11-02 (×2): 500 mg via INTRAVENOUS
  Filled 2018-10-31 (×3): qty 500

## 2018-10-31 MED ORDER — GUAIFENESIN-CODEINE 100-10 MG/5ML PO SOLN
10.0000 mL | Freq: Four times a day (QID) | ORAL | Status: DC
  Administered 2018-11-01 – 2018-11-04 (×14): 10 mL via ORAL
  Filled 2018-10-31 (×14): qty 10

## 2018-10-31 MED ORDER — SODIUM CHLORIDE 0.9 % IV SOLN
250.0000 mL | INTRAVENOUS | Status: DC | PRN
  Administered 2018-11-08: 250 mL via INTRAVENOUS

## 2018-10-31 MED ORDER — NITROGLYCERIN IN D5W 200-5 MCG/ML-% IV SOLN
0.0000 ug/min | INTRAVENOUS | Status: DC
  Administered 2018-10-31: 22:00:00 25 ug/min via INTRAVENOUS
  Administered 2018-10-31: 15 ug/min via INTRAVENOUS
  Filled 2018-10-31: qty 250

## 2018-10-31 MED ORDER — SODIUM CHLORIDE 0.9 % IV SOLN
1.0000 g | INTRAVENOUS | Status: DC
  Administered 2018-11-01 – 2018-11-02 (×2): 1 g via INTRAVENOUS
  Filled 2018-10-31: qty 10
  Filled 2018-10-31 (×2): qty 1

## 2018-10-31 MED ORDER — FUROSEMIDE 10 MG/ML IJ SOLN
60.0000 mg | Freq: Once | INTRAMUSCULAR | Status: AC
  Administered 2018-10-31: 60 mg via INTRAVENOUS
  Filled 2018-10-31: qty 8

## 2018-10-31 NOTE — ED Notes (Addendum)
Pt with critical lactic acid of 2.3 called from lab. Seals, NP notified via telephone, no new verbal orders received.

## 2018-10-31 NOTE — ED Provider Notes (Signed)
[facility] Emergency Department Provider Note  ____________________________________________   I have reviewed the triage vital signs and the nursing notes.   HISTORY  Chief Complaint Shortness of breath  History limited by and level 5 caveat due to: shortness of breath   HPI Earl Clay is a 78 y.o. male who presents to the emergency department today because of concern for shortness of breath. He states he has had issues with his breathing for two years. It did get acutely worse today. He denies any associated chest pain. Was given a duoneb by EMS with some relief. Says that he has a history of significant COPD and is on 5 liters of oxygen at baseline. Denies any fevers.    Records reviewed. Per medical record review patient has a history of CAD, COPD on oxygen.   Past Medical History:  Diagnosis Date  . Asthma   . CAD (coronary artery disease)   . COPD (chronic obstructive pulmonary disease) ([place])   . Depression   . Diabetes mellitus without complication ([place])   . Difficult intubation   . GERD (gastroesophageal reflux disease)   . Hypertension   . Lung cancer ([place])   . 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 Pancreatic mass   . PTSD (post-traumatic stress disorder)   . Sleep apnea    CPAP with O2 2L/m at night    Patient Active Problem List   Diagnosis Date Noted  . Acute on chronic respiratory failure with hypoxemia ([place]) 08/08/2018  . Lung nodule, solitary 08/15/2016  . 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 Malnutrition of moderate degree 09/09/2015  . COPD exacerbation ([place]) 09/09/2015  . ARF (acute renal failure) ([place]) 09/07/2015  . Type 2 diabetes mellitus ([place]) 07/20/2015  . Type 2 diabetes mellitus with hyperglycemia ([place]) 05/16/2014  . History of cardiac catheterization 01/24/2014  . Breathlessness on exertion 01/24/2014  . Chronic obstructive bronchitis ([address]) 11/17/2013  . CAD in native  artery 11/17/2013  . Benign essential HTN 11/17/2013  . Combined fat and carbohydrate induced hyperlipemia 11/17/2013  . Hypersomnia with sleep apnea 11/17/2013  . 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 Pure hypercholesterolemia 11/17/2013  . 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 Elevated prostate specific antigen (PSA) 08/18/2012  . Disorder of bladder function 08/18/2012  . Enlarged prostate with lower urinary tract symptoms (LUTS) 08/18/2012  . Incomplete bladder emptying 08/18/2012  . Benign prostatic hyperplasia with urinary obstruction 08/18/2012  . FOM (frequency of micturition) 08/18/2012    Past Surgical History:  Procedure Laterality Date  . HERNIA REPAIR Right    right inguinal hernia repair  . KNEE SURGERY Right    right knee repair  . UPPER ESOPHAGEAL ENDOSCOPIC ULTRASOUND (EUS) N/A 09/20/2015   Procedure: UPPER ESOPHAGEAL ENDOSCOPIC ULTRASOUND (EUS);  Surgeon: [name], MD;  Location: [facility] ENDOSCOPY;  Service: Endoscopy;  Laterality: N/A;    Prior to Admission medications   Medication Sig Start Date End Date Taking? Authorizing Provider  albuterol (PROVENTIL HFA;VENTOLIN HFA) 108 (90 Base) MCG/ACT inhaler Inhale 2 puffs into the lungs 2 (two) times daily.     [provider]  aspirin 81 MG chewable tablet Chew 81 mg by mouth daily.    [provider]  azithromycin (ZITHROMAX) 500 MG tablet Take 1 tablet (500 mg total) by mouth daily. 08/17/18   Stark Jock Jude, MD  buPROPion (WELLBUTRIN) 100 MG tablet Take 100 mg by mouth 2 (two) times daily.  [provider]  cetirizine (ZYRTEC) 10 MG tablet Take 10 mg by mouth every evening.     [provider]  citalopram (CELEXA) 20 MG tablet Take 20 mg by mouth daily.     [provider]  diltiazem (DILACOR XR) 180 MG 24 hr capsule Take 180 mg by mouth daily.    [provider]  enalapril (VASOTEC) 5 MG tablet Take 5 mg by mouth daily.     [provider]   feeding supplement, ENSURE ENLIVE, (ENSURE ENLIVE) LIQD Take 237 mLs by mouth 2 (two) times daily between meals. 08/17/18   Stark Jock Jude, MD  furosemide (LASIX) 20 MG tablet Take 20 mg by mouth daily.     [provider]  hydrochlorothiazide (HYDRODIURIL) 25 MG tablet Take 25 mg by mouth daily.     [provider]  hydrOXYzine (ATARAX/VISTARIL) 10 MG tablet Take 10 mg by mouth at bedtime.    [provider]  insulin aspart (NOVOLOG) 100 UNIT/ML injection Inject 10-20 Units into the skin 3 (three) times daily with meals as needed for high blood sugar. Pt uses as needed per sliding scale.     [provider]  insulin detemir (LEVEMIR) 100 UNIT/ML injection Inject 20 Units into the skin daily.     [provider]  magnesium oxide (MAG-OX) 400 MG tablet Take 400 mg by mouth daily.     [provider]  PARoxetine (PAXIL) 40 MG tablet Take 40 mg by mouth 2 (two) times daily.     [provider]  polyethylene glycol (MIRALAX / GLYCOLAX) packet Take 17 g by mouth daily as needed for mild constipation. 08/17/18   Stark Jock Jude, MD  pravastatin (PRAVACHOL) 80 MG tablet Take 80 mg by mouth at bedtime.     [provider]  predniSONE (DELTASONE) 10 MG tablet 40mg  daily for 3 days  Then 30mg  daily for 3 days Then 20mg  daily for 3 days Then 10mg  daily for 3 days 08/17/18   Otila Back, MD  sildenafil (VIAGRA) 100 MG tablet Take 100 mg by mouth daily as needed for erectile dysfunction.     [provider]  tamsulosin (FLOMAX) 0.4 MG CAPS capsule Take 0.4 mg by mouth at bedtime.    [provider]  theophylline (THEO-24) 300 MG 24 hr capsule Take 300 mg by mouth 2 (two) times daily.     [provider]  tiotropium (SPIRIVA) 18 MCG inhalation capsule Place 18 mcg into inhaler and inhale daily.     [provider]  tiZANidine (ZANAFLEX) 4 MG tablet Take 4 mg by mouth at bedtime.    [provider]   traZODone (DESYREL) 100 MG tablet Take 100 mg by mouth at bedtime.    [provider]    Allergies Ampicillin; Lorazepam; Penicillins; and Zyban [bupropion]  Family History  Problem Relation Age of Onset  . Hypertension Mother   . Congestive Heart Failure Father     Social History Social History   Tobacco Use  . Smoking status: Former Research scientist (life sciences)  . Smokeless tobacco: Never Used  Substance Use Topics  . Alcohol use: Not on file  . Drug use: Not on file    Review of Systems Constitutional: No fever/chills Eyes: No visual changes. ENT: No sore throat. Cardiovascular: Denies chest pain. Respiratory: Positive for shortness of breath. Gastrointestinal: No abdominal pain.  No nausea, no vomiting.  No diarrhea.  Genitourinary: Negative for dysuria. Musculoskeletal: Negative for back pain. Skin: Negative for  rash. Neurological: Negative for headaches, focal weakness or numbness.  ____________________________________________   PHYSICAL EXAM:  VITAL SIGNS: ED Triage Vitals  Enc Vitals Group     BP 216/192     Pulse 139     Resp 46     Temp 97.8     Temp src      SpO2 96   Constitutional: Alert and oriented. Respiratory distress.  Eyes: Conjunctivae are normal.  ENT      Head: Normocephalic and atraumatic.      Nose: No congestion/rhinnorhea.      Mouth/Throat: Mucous membranes are moist.      Neck: No stridor. Hematological/Lymphatic/Immunilogical: No cervical lymphadenopathy. Cardiovascular: Normal rate, regular rhythm.  No murmurs, rubs, or gallops.  Respiratory: Increased respiratory distress. Tachypnea.  Gastrointestinal: Soft and non tender. No rebound. No guarding.  Genitourinary: Deferred Musculoskeletal: Normal range of motion in all extremities. No lower extremity edema. Neurologic:  Normal speech and language. No gross focal neurologic deficits are appreciated.  Skin:  Skin is warm, dry and intact. No rash noted. Psychiatric: Mood and affect are  normal. Speech and behavior are normal. Patient exhibits appropriate insight and judgment.  ____________________________________________    LABS (pertinent positives/negatives)  CBC wbc 15.8, hgb 13.3, plt 312 Trop 0.08 Lactic 2.6 BMP na 139, k 3.7, cl 97, glu 196, cr 1.16 ____________________________________________   EKG  I, Nance Pear, attending physician, personally viewed and interpreted this EKG  EKG Time: 1833 Rate: 124 Rhythm: sinus tachycardia Axis: normal Intervals: qtc 500 QRS: RBBB ST changes: no st elevation Impression: abnormal ekg   ____________________________________________    RADIOLOGY  CXR Concern for infiltrate  ____________________________________________   PROCEDURES  Procedures  CRITICAL CARE Performed by: Nance Pear   Total critical care time: 35 minutes  Critical care time was exclusive of separately billable procedures and treating other patients.  Critical care was necessary to treat or prevent imminent or life-threatening deterioration.  Critical care was time spent personally by me on the following activities: development of treatment plan with patient and/or surrogate as well as nursing, discussions with consultants, evaluation of patient's response to treatment, examination of patient, obtaining history from patient or surrogate, ordering and performing treatments and interventions, ordering and review of laboratory studies, ordering and review of radiographic studies, pulse oximetry and re-evaluation of patient's condition.  ____________________________________________   INITIAL IMPRESSION / ASSESSMENT AND PLAN / ED COURSE  Pertinent labs & imaging results that were available during my care of the patient were reviewed by me and considered in my medical decision making (see chart for details).   Patient presented to the emergency department today because of concerns for respiratory distress.  Does have a history  of fairly significant COPD.  Initial vital signs were notable for significantly elevated blood pressure.  Did have concerns for some pulmonary edema given elevated blood pressure and physical exam.  He was started on a nitro drip and was also given Lasix.  He did seem to respond somewhat positively to this treatment.  Did defer BiPAP at this time given concern for possible COVID as well.  Patient's blood work showed a lactic acidosis, leukocytosis.  X-ray was concerning for possible infiltrate.  He has had pneumonia in the past I did start broad-spectrum antibiotics.  Furthermore his COVID came back negative.  We did then place him on BiPAP.  Patient will be admitted to the hospital service.  Discussed plan with patient.  ____________________________________________   FINAL CLINICAL IMPRESSION(S) / ED  DIAGNOSES  Final diagnoses:  Acute respiratory failure, unspecified whether with hypoxia or hypercapnia (HCC)  Hypertension, unspecified type  Community acquired pneumonia, unspecified laterality  Lactic acidosis     Note: This dictation was prepared with Sales executive. Any transcriptional errors that result from this process are unintentional     Nance Pear, MD 10/31/18 2010

## 2018-10-31 NOTE — ED Notes (Signed)
April RN, aware of bed assigned   

## 2018-10-31 NOTE — Progress Notes (Signed)
eLink Physician-Brief Progress Note Patient Name: BEAR OSTEN DOB: 1940/11/13 MRN: [id number]   Date of Service  10/31/2018  HPI/Events of Note  78 yo male with PMH of COPD on home O2, Lung CA, DM and CAD. Admitted with CAP on Rochephin/Azithro and BiPAP. PCCM asked to assume care. VSS.  eICU Interventions  No new orders.     Intervention Category Evaluation Type: New Patient Evaluation  Lysle Dingwall 10/31/2018, 10:36 PM

## 2018-10-31 NOTE — ED Notes (Signed)
Pt tolerating bipap well.  °

## 2018-10-31 NOTE — ED Notes (Signed)
Condom cath placed on pt for I and O and comfort due to bipap.

## 2018-10-31 NOTE — ED Notes (Signed)
Date and time results received: 10/31/18 1907   Test: lactic acid Critical Value: 2.6  Name of Provider Notified: Dr. Archie Balboa

## 2018-10-31 NOTE — H&P (Addendum)
[facility] NAME: Earl Clay    MR#:  [id number]  DATE OF BIRTH:  1940/08/05  DATE OF ADMISSION:  10/31/2018  PRIMARY CARE PHYSICIAN: [name], MD   REQUESTING/REFERRING PHYSICIAN: Nance Pear, MD  CHIEF COMPLAINT:   Chief Complaint  Patient presents with   Respiratory Distress    HISTORY OF PRESENT ILLNESS:  Earl Clay  is a 78 y.o. male with a known history of COPD and CHF as well as CKD stage III with a past history of lung cancer in 2018 status post radiation therapy.  Patient recently saw his oncologist with possible recurrence according to documentation in the medical record.  No plan discussed at this time regarding this.  He presented to the emergency room via EMS services with increased shortness of breath over the last 2 weeks which became acutely worse today.  Patient was found to be on his BiPAP machine when EMS services arrived with 70% oxygen saturation.  He was placed on a nonrebreather and brought to the emergency room for further management.  He was found to be in respiratory distress on arrival and therefore started on BiPAP therapy with oxygen supplementation.  Currently oxygen saturations are 94 to 95%.  He denies noting fevers or chills.  He denies chest pain.  No nausea, vomiting, diarrhea.  He denies abdominal pain.  He reports occasional cough which is nonproductive.  He has noted no increased edema of extremities.  Rapid COVID 19 testing is negative.  Patient was treated for pneumonia with hospitalization in February 2020.  On arrival, leukocytosis with WBC 15.8 as well as elevated lactic acid 2.6.  He was hypertensive on arrival with blood pressure 228/201.  Nitroglycerin infusion was initiated at that point.  Troponin is 0.08.  Chest x-ray demonstrated possible infiltrate.  He was started on broad-spectrum antibiotic therapy with azithromycin and Rocephin.  patient has been admitted  to the hospital service for further management.  PAST MEDICAL HISTORY:   Past Medical History:  Diagnosis Date   Asthma    CAD (coronary artery disease)    COPD (chronic obstructive pulmonary disease) ([place])    Depression    Diabetes mellitus without complication ([place])    Difficult intubation    GERD (gastroesophageal reflux disease)    Hypertension    Lung cancer ([place])    Oxygen dependent    2-2.5L/m continuously   Pancreatic mass    PTSD (post-traumatic stress disorder)    Sleep apnea    CPAP with O2 2L/m at night    PAST SURGICAL HISTORY:   Past Surgical History:  Procedure Laterality Date   HERNIA REPAIR Right    right inguinal hernia repair   KNEE SURGERY Right    right knee repair   UPPER ESOPHAGEAL ENDOSCOPIC ULTRASOUND (EUS) N/A 09/20/2015   Procedure: UPPER ESOPHAGEAL ENDOSCOPIC ULTRASOUND (EUS);  Surgeon: [name], MD;  [facility];  Service: Endoscopy;  Laterality: N/A;    SOCIAL HISTORY:   Social History   Tobacco Use   Smoking status: Former Smoker   Smokeless tobacco: Never Used  Substance Use Topics   Alcohol use: Not on file    FAMILY HISTORY:   Family History  Problem Relation Age of Onset   Hypertension Mother    Congestive Heart Failure Father     DRUG ALLERGIES:   Allergies  Allergen Reactions   Ampicillin Hives and Other (See Comments)    Has patient  had a PCN reaction causing immediate rash, facial/tongue/throat swelling, SOB or lightheadedness with hypotension: No Has patient had a PCN reaction causing severe rash involving mucus membranes or skin necrosis: No Has patient had a PCN reaction that required hospitalization No Has patient had a PCN reaction occurring within the last 10 years: No If all of the above answers are "NO", then may proceed with Cephalosporin use.   Lorazepam     Worsening confusion   Penicillins Hives and Other (See Comments)    Has patient had a PCN  reaction causing immediate rash, facial/tongue/throat swelling, SOB or lightheadedness with hypotension: No Has patient had a PCN reaction causing severe rash involving mucus membranes or skin necrosis: No Has patient had a PCN reaction that required hospitalization No Has patient had a PCN reaction occurring within the last 10 years: No If all of the above answers are "NO", then may proceed with Cephalosporin use.   Zyban [Bupropion] Rash    REVIEW OF SYSTEMS:   Review of Systems  Constitutional: Positive for malaise/fatigue. Negative for chills and fever.  HENT: Positive for congestion. Negative for ear pain and sore throat.   Eyes: Negative for blurred vision and double vision.  Respiratory: Positive for cough, sputum production, shortness of breath and wheezing.   Cardiovascular: Negative for chest pain, palpitations and leg swelling.  Gastrointestinal: Negative for abdominal pain, diarrhea, nausea and vomiting.  Genitourinary: Negative for dysuria and hematuria.  Musculoskeletal: Negative for back pain and neck pain.  Skin: Negative.  Negative for itching and rash.  Neurological: Positive for weakness. Negative for dizziness and headaches.  Psychiatric/Behavioral: Negative.       MEDICATIONS AT HOME:   Prior to Admission medications   Medication Sig Start Date End Date Taking? Authorizing Provider  albuterol (PROVENTIL HFA;VENTOLIN HFA) 108 (90 Base) MCG/ACT inhaler Inhale 2 puffs into the lungs 2 (two) times daily.    Yes [provider]  buPROPion (WELLBUTRIN) 100 MG tablet Take 100 mg by mouth 2 (two) times daily.   Yes [provider]  cetirizine (ZYRTEC) 10 MG tablet Take 10 mg by mouth every evening.    Yes [provider]  diltiazem (DILACOR XR) 180 MG 24 hr capsule Take 180 mg by mouth daily.   Yes [provider]  enalapril (VASOTEC) 5 MG tablet Take 5 mg by mouth daily.    Yes [provider]  hydrochlorothiazide  (HYDRODIURIL) 25 MG tablet Take 25 mg by mouth daily.    Yes [provider]  hydrOXYzine (ATARAX/VISTARIL) 10 MG tablet Take 10 mg by mouth at bedtime.   Yes [provider]  insulin aspart (NOVOLOG) 100 UNIT/ML injection Inject 10-20 Units into the skin 3 (three) times daily with meals as needed for high blood sugar. Pt uses as needed per sliding scale.    Yes [provider]  insulin detemir (LEVEMIR) 100 UNIT/ML injection Inject 20 Units into the skin daily.    Yes [provider]  magnesium oxide (MAG-OX) 400 MG tablet Take 400 mg by mouth daily.    Yes [provider]  PARoxetine (PAXIL) 40 MG tablet Take 40 mg by mouth 2 (two) times daily.    Yes [provider]  pravastatin (PRAVACHOL) 80 MG tablet Take 80 mg by mouth at bedtime.    Yes [provider]  QUEtiapine (SEROQUEL) 100 MG tablet Take 1 tablet by mouth at bedtime. 09/22/18  Yes [provider]  sildenafil (VIAGRA) 100 MG tablet Take 100  mg by mouth daily as needed for erectile dysfunction.    Yes [provider]  tamsulosin (FLOMAX) 0.4 MG CAPS capsule Take 0.4 mg by mouth at bedtime.   Yes [provider]  theophylline (THEO-24) 300 MG 24 hr capsule Take 300 mg by mouth 2 (two) times daily.    Yes [provider]  tiotropium (SPIRIVA) 18 MCG inhalation capsule Place 18 mcg into inhaler and inhale daily.    Yes [provider]  tiZANidine (ZANAFLEX) 4 MG tablet Take 4 mg by mouth at bedtime.   Yes [provider]  traZODone (DESYREL) 100 MG tablet Take 100 mg by mouth at bedtime.   Yes [provider]  aspirin 81 MG chewable tablet Chew 81 mg by mouth daily.    [provider]  azithromycin (ZITHROMAX) 500 MG tablet Take 1 tablet (500 mg total) by mouth daily. Patient not taking: Reported on 10/31/2018 08/17/18   Otila Back, MD  citalopram (CELEXA) 20 MG tablet Take 20 mg by mouth daily.     [provider]  feeding supplement, ENSURE ENLIVE, (ENSURE ENLIVE) LIQD Take 237 mLs by mouth 2 (two) times daily between meals. 08/17/18   Stark Jock Jude, MD  furosemide (LASIX) 20 MG tablet Take 20 mg by mouth daily.     [provider]  polyethylene glycol (MIRALAX / GLYCOLAX) packet Take 17 g by mouth daily as needed for mild constipation. Patient not taking: Reported on 10/31/2018 08/17/18   Otila Back, MD  predniSONE (DELTASONE) 10 MG tablet 40mg  daily for 3 days  Then 30mg  daily for 3 days Then 20mg  daily for 3 days Then 10mg  daily for 3 days Patient not taking: Reported on 10/31/2018 08/17/18   Otila Back, MD      VITAL SIGNS:  Blood pressure 120/85, pulse (!) 116, temperature 98.1 F (36.7 C), temperature source Oral, resp. rate 20, height 5\' 6"  (1.676 m), weight 74.8 kg, SpO2 100 %.  PHYSICAL EXAMINATION:  Physical Exam Constitutional:      General: He is in acute distress.     Appearance: He is ill-appearing.  HENT:     Head: Normocephalic and atraumatic.     Nose: Nose normal.     Mouth/Throat:     Mouth: Mucous membranes are moist.     Pharynx: Oropharynx is clear.  Eyes:     General: No scleral icterus.    Conjunctiva/sclera: Conjunctivae normal.     Pupils: Pupils are equal, round, and reactive to light.  Neck:     Musculoskeletal: Normal range of motion and neck supple.  Cardiovascular:     Rate and Rhythm: Normal rate and regular rhythm.     Pulses: Normal pulses.     Heart sounds: Murmur present.  Pulmonary:     Effort: Respiratory distress present.     Breath sounds: Rhonchi and rales present.  Abdominal:     General: Abdomen is flat. Bowel sounds are normal. There is no distension.     Palpations: Abdomen is soft. There is no mass.     Tenderness: There is no abdominal tenderness.  Musculoskeletal: Normal range of motion.  Skin:    General: Skin is warm and dry.     Capillary Refill: Capillary refill takes less than 2 seconds.     Findings: No  rash.  Neurological:     General: No focal deficit present.     Mental Status: He is alert and oriented to person, place, and time.  Psychiatric:        Mood and Affect: Mood normal.      LABORATORY PANEL:   CBC Recent Labs  Lab 10/31/18 1808  WBC 15.8*  HGB 13.3  HCT 44.2  PLT 312   ------------------------------------------------------------------------------------------------------------------  Chemistries  Recent Labs  Lab 10/31/18 1808  NA 139  K 3.7  CL 97*  CO2 28  GLUCOSE 196*  BUN 20  CREATININE 1.16  CALCIUM 9.6   ------------------------------------------------------------------------------------------------------------------  Cardiac Enzymes Recent Labs  Lab 10/31/18 1808  TROPONINI 0.08*   ------------------------------------------------------------------------------------------------------------------  RADIOLOGY:  Dg Chest Portable 1 View  Result Date: 10/31/2018 CLINICAL DATA:  78 year old male with lung cancer and COPD on home BiPAP. Shortness of breath. EXAM: PORTABLE CHEST 1 VIEW COMPARISON:  Chest CT 10/26/2018 and earlier. FINDINGS: Portable AP upright view at 1830 hours. Right suprahilar masslike opacity redemonstrated. Stable lung volumes since February. Increased bilateral hilar density since February, likely stable from the recent CT. Stable cardiac size. Visualized tracheal air column is within normal limits. No pneumothorax. Persistent small bilateral pleural effusions. Patchy and indistinct increased right basilar opacity since February, might be stable from the recent CT. Negative visible bowel gas pattern. IMPRESSION: 1. Similar to the recent chest CT 10/26/2018. Right suprahilar masslike opacity and Small bilateral pleural effusions. 2. Patchy and indistinct right lung base opacity, questionably increased from the recent CT, consider acute infectious exacerbation. Electronically Signed   By: Genevie Ann M.D.   On: 10/31/2018 19:07       IMPRESSION AND PLAN:   1.  Acute respiratory distress: - Placed on BiPAP therapy with oxygen supplementation - Admitted to stepdown for close monitoring - Patient is on home oxygen therapy at 4 to 5 L/min at home with BiPAP during hours of sleep  2.  Community-acquired pneumonia - Started on broad-spectrum IV antibiotic therapy with azithromycin and Rocephin - COVID-19 rapid test is negative - DuoNebs every 6 hours as needed for shortness of breath - Sputum culture pending  3.  Lactic acidosis - No fluid bolus initiated given patient's admitting presentation with respiratory distress currently on BiPAP therapy tolerating well - He has been started on broad-spectrum antibiotic therapy.  Will repeat lactic acid level and treat accordingly  4.  Acute exacerbation COPD - Patient started on IV antibiotic therapy with azithromycin and Rocephin for community-acquired pneumonia.  Will add IV steroid therapy with Solu-Medrol. - PRN duo nebs - Patient is on BiPAP  5.  Hypertension -Nitroglycerin infusion started in the Emergency Room -continue to treat persistent hypertension expectantly -home antihypertensives restarted  All the records are reviewed and case discussed with ED provider. The plan of care was discussed in details with the patient (and family). I answered all questions. The patient agreed to proceed with the above mentioned plan. Further management will depend upon hospital course.   CODE STATUS: Full code  TOTAL TIME TAKING CARE OF THIS PATIENT: 45 minutes.    Mayer Camel M.D on 10/31/2018 at 10:19 PM  Pager - [phone]  After 6pm go to [email or web address] - Proofreader  [facility]  Office  [phone]  CC: Primary care physician; [name], MD   Note: This dictation was prepared with Dragon dictation along with smaller phrase technology. Any transcriptional errors that result from this process are  unintentional.

## 2018-10-31 NOTE — ED Notes (Signed)
ED TO INPATIENT HANDOFF REPORT  ED Nurse Name and Phone #: Paden Senger [phone]   S Name/Age/Gender Earl Clay 78 y.o. male Room/Bed: ED06A/ED06A  Code Status   Code Status: Prior  Home/SNF/Other Home Patient oriented to: self, place, time and situation Is this baseline? Yes   Triage Complete: Triage complete  Chief Complaint Respiratory Distress  Triage Note Pt arrives [facility] from home. Hx L lung CA and COPD. Was on bipap at the house, EMS placed pt back on normal 4-5 L [place], pt was 92%. Pt placed on non-rebreather at 15LPM. EMS gave albuterol neb and solumedrol. States pt imrproved. Pt breathing is shallow and rapid. Not moving much air at all. Pt is not able to talk in complete sentences. Pt tripoding upon arrival. Pt denies chest pain. Pt denies fever at home.    Allergies Allergies  Allergen Reactions  . Ampicillin Hives and Other (See Comments)    Has patient had a PCN reaction causing immediate rash, facial/tongue/throat swelling, SOB or lightheadedness with hypotension: No Has patient had a PCN reaction causing severe rash involving mucus membranes or skin necrosis: No Has patient had a PCN reaction that required hospitalization No Has patient had a PCN reaction occurring within the last 10 years: No If all of the above answers are "NO", then may proceed with Cephalosporin use.  . Lorazepam     Worsening confusion  . Penicillins Hives and Other (See Comments)    Has patient had a PCN reaction causing immediate rash, facial/tongue/throat swelling, SOB or lightheadedness with hypotension: No Has patient had a PCN reaction causing severe rash involving mucus membranes or skin necrosis: No Has patient had a PCN reaction that required hospitalization No Has patient had a PCN reaction occurring within the last 10 years: No If all of the above answers are "NO", then may proceed with Cephalosporin use.  . Zyban [Bupropion] Rash    Level of Care/Admitting Diagnosis ED  Disposition    ED Disposition Condition [facility] Area: [place] [[id number]]  Level of Care: Stepdown [14]  Covid Evaluation: N/A  Diagnosis: PNA (pneumonia) [416606]  Admitting Physician: Max Sane [[id number]]  Attending Physician: Max Sane [[id number]]  Estimated length of stay: past midnight tomorrow  Certification:: I certify this patient will need inpatient services for at least 2 midnights  PT Class (Do Not Modify): Inpatient [101]  PT Acc Code (Do Not Modify): Private [1]       B Medical/Surgery History Past Medical History:  Diagnosis Date  . Asthma   . CAD (coronary artery disease)   . COPD (chronic obstructive pulmonary disease) ([place])   . Depression   . Diabetes mellitus without complication ([place])   . Difficult intubation   . GERD (gastroesophageal reflux disease)   . Hypertension   . Lung cancer ([place])   . 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 Pancreatic mass   . PTSD (post-traumatic stress disorder)   . Sleep apnea    CPAP with O2 2L/m at night   Past Surgical History:  Procedure Laterality Date  . HERNIA REPAIR Right    right inguinal hernia repair  . KNEE SURGERY Right    right knee repair  . UPPER ESOPHAGEAL ENDOSCOPIC ULTRASOUND (EUS) N/A 09/20/2015   Procedure: UPPER ESOPHAGEAL ENDOSCOPIC ULTRASOUND (EUS);  Surgeon: [name], MD;  Location: [facility];  Service: Endoscopy;  Laterality: N/A;     A IV Location/Drains/Wounds Patient Lines/Drains/Airways Status  Active Line/Drains/Airways    Name:   Placement date:   Placement time:   Site:   Days:   Peripheral IV 10/31/18 Left Antecubital   10/31/18    1819    Antecubital   less than 1   Peripheral IV 10/31/18 Left Forearm   10/31/18    1820    Forearm   less than 1   Peripheral IV 10/31/18 Right Antecubital   10/31/18    1820    Antecubital   less than 1          Intake/Output Last 24 hours  Intake/Output Summary (Last 24 hours) at  10/31/2018 2037 Last data filed at 10/31/2018 2034 Gross per 24 hour  Intake 100 ml  Output 600 ml  Net -500 ml    Labs/Imaging Results for orders placed or performed during the hospital encounter of 10/31/18 (from the past 48 hour(s))  CBC with Differential     Status: Abnormal   Collection Time: 10/31/18  6:08 PM  Result Value Ref Range   WBC 15.8 (H) 4.0 - 10.5 K/uL   RBC 4.91 4.22 - 5.81 MIL/uL   Hemoglobin 13.3 13.0 - 17.0 g/dL   HCT 44.2 39.0 - 52.0 %   MCV 90.0 80.0 - 100.0 fL   MCH 27.1 26.0 - 34.0 pg   MCHC 30.1 30.0 - 36.0 g/dL   RDW 17.4 (H) 11.5 - 15.5 %   Platelets 312 150 - 400 K/uL   nRBC 0.0 0.0 - 0.2 %   Neutrophils Relative % 75 %   Neutro Abs 11.8 (H) 1.7 - 7.7 K/uL   Lymphocytes Relative 15 %   Lymphs Abs 2.4 0.7 - 4.0 K/uL   Monocytes Relative 8 %   Monocytes Absolute 1.2 (H) 0.1 - 1.0 K/uL   Eosinophils Relative 1 %   Eosinophils Absolute 0.2 0.0 - 0.5 K/uL   Basophils Relative 1 %   Basophils Absolute 0.1 0.0 - 0.1 K/uL   Immature Granulocytes 0 %   Abs Immature Granulocytes 0.07 0.00 - 0.07 K/uL    Comment: Performed at [facility], [address] [place], [place] [address]  Basic metabolic panel     Status: Abnormal   Collection Time: 10/31/18  6:08 PM  Result Value Ref Range   Sodium 139 135 - 145 mmol/L   Potassium 3.7 3.5 - 5.1 mmol/L   Chloride 97 (L) 98 - 111 mmol/L   CO2 28 22 - 32 mmol/L   Glucose, Bld 196 (H) 70 - 99 mg/dL   BUN 20 8 - 23 mg/dL   Creatinine, Ser 1.16 0.61 - 1.24 mg/dL   Calcium 9.6 8.9 - 10.3 mg/dL   GFR calc non Af Amer >60 >60 mL/min   GFR calc Af Amer >60 >60 mL/min   Anion gap 14 5 - 15    Comment: Performed at [facility], [address] [place], [place] [address]  Troponin I - ONCE - STAT     Status: Abnormal   Collection Time: 10/31/18  6:08 PM  Result Value Ref Range   Troponin I 0.08 (HH) <0.03 ng/mL    Comment: CRITICAL RESULT CALLED TO, READ BACK BY AND VERIFIED WITH LEA FERGUSON  RN AT 1905 10/31/2018. MSS Performed at [facility], [address], [place], [place] [address]   SARS Coronavirus 2 [facility] order, Performed in [facility] hospital lab)     Status: None   Collection Time: 10/31/18  6:08 PM  Result Value Ref Range   SARS Coronavirus 2 NEGATIVE NEGATIVE    Comment: (NOTE) If result is NEGATIVE SARS-CoV-2 target nucleic acids are NOT DETECTED. The SARS-CoV-2 RNA is generally detectable in upper and lower  respiratory specimens during the acute phase of infection. The lowest  concentration of SARS-CoV-2 viral copies this assay can detect is 250  copies / mL. A negative result does not preclude SARS-CoV-2 infection  and should not be used as the sole basis for treatment or other  patient management decisions.  A negative result may occur with  improper specimen collection / handling, submission of specimen other  than nasopharyngeal swab, presence of viral mutation(s) within the  areas targeted by this assay, and inadequate number of viral copies  (<250 copies / mL). A negative result must be combined with clinical  observations, patient history, and epidemiological information. If result is POSITIVE SARS-CoV-2 target nucleic acids are DETECTED. The SARS-CoV-2 RNA is generally detectable in upper and lower  respiratory specimens dur ing the acute phase of infection.  Positive  results are indicative of active infection with SARS-CoV-2.  Clinical  correlation with patient history and other diagnostic information is  necessary to determine patient infection status.  Positive results do  not rule out bacterial infection or co-infection with other viruses. If result is PRESUMPTIVE POSTIVE SARS-CoV-2 nucleic acids MAY BE PRESENT.   A presumptive positive result was obtained on the submitted specimen  and confirmed on repeat testing.  While 2019 novel coronavirus  (SARS-CoV-2) nucleic acids may be present in the submitted sample  additional  confirmatory testing may be necessary for epidemiological  and / or clinical management purposes  to differentiate between  SARS-CoV-2 and other Sarbecovirus currently known to infect humans.  If clinically indicated additional testing with an alternate test  methodology [phone]) is advised. The SARS-CoV-2 RNA is generally  detectable in upper and lower respiratory sp ecimens during the acute  phase of infection. The expected result is Negative. Fact Sheet for Patients:  StrictlyIdeas.no Fact Sheet for Healthcare Providers: BankingDealers.co.za This test is not yet approved or cleared by the [place] FDA and has been authorized for detection and/or diagnosis of SARS-CoV-2 by FDA under an Emergency Use Authorization (EUA).  This EUA will remain in effect (meaning this test can be used) for the duration of the COVID-19 declaration under Section 564(b)(1) of the Act, 21 U.S.C. section 360bbb-3(b)(1), unless the authorization is terminated or revoked sooner. Performed at [facility], [address] [place], [place] [address]   Lactic acid, plasma     Status: Abnormal   Collection Time: 10/31/18  6:08 PM  Result Value Ref Range   Lactic Acid, Venous 2.6 (HH) 0.5 - 1.9 mmol/L    Comment: CRITICAL RESULT CALLED TO, READ BACK BY AND VERIFIED WITH LEA FERGUSON RN AT 1905 10/31/2018.MSS Performed at [facility], [address], [place], [place] [address]    Dg Chest Portable 1 View  Result Date: 10/31/2018 CLINICAL DATA:  78 year old male with lung cancer and COPD on home BiPAP. Shortness of breath. EXAM: PORTABLE CHEST 1 VIEW COMPARISON:  Chest CT 10/26/2018 and earlier. FINDINGS: Portable AP upright view at 1830 hours. Right suprahilar masslike opacity redemonstrated. Stable lung volumes since February. Increased bilateral hilar density since February, likely stable from the recent CT. Stable cardiac size. Visualized  tracheal air column is within normal limits. No pneumothorax. Persistent small bilateral pleural effusions. Patchy and indistinct increased right basilar opacity since February, might  be stable from the recent CT. Negative visible bowel gas pattern. IMPRESSION: 1. Similar to the recent chest CT 10/26/2018. Right suprahilar masslike opacity and Small bilateral pleural effusions. 2. Patchy and indistinct right lung base opacity, questionably increased from the recent CT, consider acute infectious exacerbation. Electronically Signed   By: Genevie Ann M.D.   On: 10/31/2018 19:07    Pending Labs Unresulted Labs (From admission, onward)    Start     Ordered   10/31/18 1808  Lactic acid, plasma  Now then every 2 hours,   STAT     10/31/18 1808   10/31/18 1808  Blood Culture (routine x 2)  BLOOD CULTURE X 2,   STAT     10/31/18 1808          Vitals/Pain Today's Vitals   10/31/18 1930 10/31/18 2010 10/31/18 2011 10/31/18 2015  BP: 135/89   120/85  Pulse: (!) 117 (!) 118 (!) 122 (!) 116  Resp:  (!) 23 20 20   Temp:      TempSrc:      SpO2: 100% 98% 98% 100%  Weight:      Height:      PainSc:        Isolation Precautions No active isolations  Medications Medications  nitroGLYCERIN 50 mg in dextrose 5 % 250 mL (0.2 mg/mL) infusion (25 mcg/min Intravenous Rate/Dose Change 10/31/18 1839)  azithromycin (ZITHROMAX) 500 mg in sodium chloride 0.9 % 250 mL IVPB (500 mg Intravenous New Bag/Given 10/31/18 2015)  furosemide (LASIX) injection 60 mg (60 mg Intravenous Given 10/31/18 1826)  cefTRIAXone (ROCEPHIN) 1 g in sodium chloride 0.9 % 100 mL IVPB (0 g Intravenous Stopped 10/31/18 2006)    Mobility walks Moderate fall risk   Focused Assessments Cardiac Assessment Handoff:  Cardiac Rhythm: Sinus tachycardia Lab Results  Component Value Date   CKTOTAL 117 09/07/2015   CKMB 3.9 (H) 05/16/2014   TROPONINI 0.08 ([place]) 10/31/2018   No results found for: DDIMER Does the Patient currently have chest  pain? No  , Pulmonary Assessment Handoff:  Lung sounds: Bilateral Breath Sounds: Diminished L Breath Sounds: Rales, Fine crackles R Breath Sounds: Rales, Fine crackles O2 Device: Bi-PAP O2 Flow Rate (L/min): 15 L/min      R Recommendations: See Admitting Provider Note  Report given to:   Additional Notes:

## 2018-10-31 NOTE — ED Notes (Signed)
X-ray at bedside

## 2018-10-31 NOTE — ED Notes (Signed)
This RN has been unable to obtain EKG d/t pt excessive diaphoresis. Stickers will not stick to patient. They fall off immediately. Have attempted to tape stickers on but that falls off as well. Will continue to attempt to obtain EKG.

## 2018-10-31 NOTE — ED Notes (Signed)
Attempt to call report to icu, not able to call report at this time.

## 2018-10-31 NOTE — ED Notes (Signed)
VBG sent to lab on ice.

## 2018-10-31 NOTE — ED Notes (Signed)
Pt ringing call bell to report "hurry up and get me in a room, I'm sick of being in this room". Explanation again provided to pt regarding delay in transfer to ICU.

## 2018-10-31 NOTE — ED Notes (Signed)
Pt in more resp distress, md notified, order for bipap received. RT notified. Pt with crackles noted. md notified of continued crackles.

## 2018-10-31 NOTE — ED Notes (Signed)
Pt will not lay on stretcher d/t tripoding, has feet hanging off the side. This RN allowing pt to sit like this for comfort but has warned pt about if he becomes too fatigued he will need to lay back on stretcher. Pt verbalized understanding.

## 2018-10-31 NOTE — ED Notes (Signed)
RT here to place pt on bipap.

## 2018-10-31 NOTE — ED Notes (Signed)
MD at bedside reassessing pt. Pt is able to speak in short sentences, but reports feeling improved, skin no longer diaphoretic.

## 2018-10-31 NOTE — ED Notes (Addendum)
Daughter- Velta Addison- called for update. [phone] (cell)

## 2018-10-31 NOTE — ED Triage Notes (Addendum)
Pt arrives [facility] from home. Hx L lung CA and COPD. Was on bipap at the house, EMS placed pt back on normal 4-5 L [place], pt was 92%. Pt placed on non-rebreather at 15LPM. EMS gave albuterol neb and solumedrol. States pt imrproved. Pt breathing is shallow and rapid. Not moving much air at all. Pt is not able to talk in complete sentences. Pt tripoding upon arrival. Pt denies chest pain. Pt denies fever at home.

## 2018-10-31 NOTE — ED Notes (Signed)
Pt tolerating bipap well. Pt states he feels improved.

## 2018-11-01 ENCOUNTER — Encounter: Payer: Self-pay | Admitting: Primary Care

## 2018-11-01 DIAGNOSIS — C3412 Malignant neoplasm of upper lobe, left bronchus or lung: Secondary | ICD-10-CM

## 2018-11-01 DIAGNOSIS — J441 Chronic obstructive pulmonary disease with (acute) exacerbation: Secondary | ICD-10-CM

## 2018-11-01 DIAGNOSIS — J9600 Acute respiratory failure, unspecified whether with hypoxia or hypercapnia: Secondary | ICD-10-CM

## 2018-11-01 DIAGNOSIS — Z7189 Other specified counseling: Secondary | ICD-10-CM

## 2018-11-01 DIAGNOSIS — J9620 Acute and chronic respiratory failure, unspecified whether with hypoxia or hypercapnia: Secondary | ICD-10-CM

## 2018-11-01 DIAGNOSIS — Z515 Encounter for palliative care: Secondary | ICD-10-CM

## 2018-11-01 DIAGNOSIS — J189 Pneumonia, unspecified organism: Secondary | ICD-10-CM

## 2018-11-01 DIAGNOSIS — N183 Chronic kidney disease, stage 3 (moderate): Secondary | ICD-10-CM

## 2018-11-01 LAB — BLOOD GAS, ARTERIAL
Acid-Base Excess: 10.1 mmol/L — ABNORMAL HIGH (ref 0.0–2.0)
Bicarbonate: 35.6 mmol/L — ABNORMAL HIGH (ref 20.0–28.0)
Delivery systems: POSITIVE
Expiratory PAP: 5
FIO2: 50
Inspiratory PAP: 15
Mechanical Rate: 10
O2 Saturation: 93.8 %
Patient temperature: 37
pCO2 arterial: 50 mmHg — ABNORMAL HIGH (ref 32.0–48.0)
pH, Arterial: 7.46 — ABNORMAL HIGH (ref 7.350–7.450)
pO2, Arterial: 66 mmHg — ABNORMAL LOW (ref 83.0–108.0)

## 2018-11-01 LAB — RESPIRATORY PANEL BY PCR

## 2018-11-01 LAB — URINALYSIS, ROUTINE W REFLEX MICROSCOPIC
Bacteria, UA: NONE SEEN
Bilirubin Urine: NEGATIVE
Glucose, UA: NEGATIVE mg/dL
Ketones, ur: NEGATIVE mg/dL
Leukocytes,Ua: NEGATIVE
Nitrite: NEGATIVE
Protein, ur: 30 mg/dL — AB
Specific Gravity, Urine: 1.008 (ref 1.005–1.030)
pH: 5 (ref 5.0–8.0)

## 2018-11-01 LAB — BASIC METABOLIC PANEL
Anion gap: 12 (ref 5–15)
BUN: 22 mg/dL (ref 8–23)
CO2: 32 mmol/L (ref 22–32)
Calcium: 9 mg/dL (ref 8.9–10.3)
Chloride: 96 mmol/L — ABNORMAL LOW (ref 98–111)
Creatinine, Ser: 1.34 mg/dL — ABNORMAL HIGH (ref 0.61–1.24)
GFR calc Af Amer: 60 mL/min — ABNORMAL LOW (ref >60)
GFR calc non Af Amer: 51 mL/min — ABNORMAL LOW (ref >60)
Glucose, Bld: 161 mg/dL — ABNORMAL HIGH (ref 70–99)
Potassium: 3.2 mmol/L — ABNORMAL LOW (ref 3.5–5.1)
Sodium: 140 mmol/L (ref 135–145)

## 2018-11-01 LAB — GLUCOSE, CAPILLARY
Glucose-Capillary: 141 mg/dL — ABNORMAL HIGH (ref 70–99)
Glucose-Capillary: 160 mg/dL — ABNORMAL HIGH (ref 70–99)
Glucose-Capillary: 166 mg/dL — ABNORMAL HIGH (ref 70–99)
Glucose-Capillary: 173 mg/dL — ABNORMAL HIGH (ref 70–99)
Glucose-Capillary: 207 mg/dL — ABNORMAL HIGH (ref 70–99)
Glucose-Capillary: 213 mg/dL — ABNORMAL HIGH (ref 70–99)

## 2018-11-01 LAB — CBC
HCT: 37.4 % — ABNORMAL LOW (ref 39.0–52.0)
Hemoglobin: 11.5 g/dL — ABNORMAL LOW (ref 13.0–17.0)
MCH: 26.9 pg (ref 26.0–34.0)
MCHC: 30.7 g/dL (ref 30.0–36.0)
MCV: 87.6 fL (ref 80.0–100.0)
Platelets: 221 10*3/uL (ref 150–400)
RBC: 4.27 MIL/uL (ref 4.22–5.81)
RDW: 17.2 % — ABNORMAL HIGH (ref 11.5–15.5)
WBC: 7.1 10*3/uL (ref 4.0–10.5)
nRBC: 0 % (ref 0.0–0.2)

## 2018-11-01 LAB — FERRITIN: Ferritin: 233 ng/mL (ref 24–336)

## 2018-11-01 LAB — PROCALCITONIN
Procalcitonin: <0.1 ng/mL
Procalcitonin: 0.19 ng/mL

## 2018-11-01 LAB — MAGNESIUM: Magnesium: 2 mg/dL (ref 1.7–2.4)

## 2018-11-01 LAB — PHOSPHORUS: Phosphorus: 2.5 mg/dL (ref 2.5–4.6)

## 2018-11-01 LAB — STREP PNEUMONIAE URINARY ANTIGEN: Strep Pneumo Urinary Antigen: NEGATIVE

## 2018-11-01 LAB — MRSA PCR SCREENING: MRSA by PCR: NEGATIVE

## 2018-11-01 MED ORDER — QUETIAPINE FUMARATE 100 MG PO TABS: 100.0000 mg | ORAL_TABLET | Freq: Every day | ORAL | Status: DC

## 2018-11-01 MED ORDER — DILTIAZEM HCL ER COATED BEADS 180 MG PO CP24
180.0000 mg | ORAL_CAPSULE | Freq: Every day | ORAL | Status: DC
  Administered 2018-11-01 – 2018-11-05 (×5): 180 mg via ORAL
  Filled 2018-11-01 (×6): qty 1

## 2018-11-01 MED ORDER — TRAZODONE HCL 100 MG PO TABS: 100.0000 mg | ORAL_TABLET | Freq: Every day | ORAL | Status: DC

## 2018-11-01 MED ORDER — PANTOPRAZOLE SODIUM 40 MG PO TBEC
40.0000 mg | DELAYED_RELEASE_TABLET | Freq: Every day | ORAL | Status: DC
  Administered 2018-11-02 – 2018-11-06 (×5): 40 mg via ORAL
  Filled 2018-11-01 (×7): qty 1

## 2018-11-01 MED ORDER — QUETIAPINE FUMARATE 100 MG PO TABS
100.0000 mg | ORAL_TABLET | Freq: Every day | ORAL | Status: DC
  Administered 2018-11-01 – 2018-11-08 (×8): 100 mg via ORAL
  Filled 2018-11-01: qty 1
  Filled 2018-11-01: qty 4
  Filled 2018-11-01 (×5): qty 1
  Filled 2018-11-01: qty 4

## 2018-11-01 MED ORDER — ACETYLCYSTEINE 20 % IN SOLN
4.0000 mL | Freq: Four times a day (QID) | RESPIRATORY_TRACT | Status: DC
  Administered 2018-11-02 – 2018-11-03 (×6): 4 mL via RESPIRATORY_TRACT
  Filled 2018-11-01 (×7): qty 4

## 2018-11-01 MED ORDER — METHYLPREDNISOLONE SODIUM SUCC 40 MG IJ SOLR
40.0000 mg | Freq: Two times a day (BID) | INTRAMUSCULAR | Status: DC
  Administered 2018-11-01 – 2018-11-02 (×3): 40 mg via INTRAVENOUS
  Filled 2018-11-01 (×3): qty 1

## 2018-11-01 MED ORDER — ACETYLCYSTEINE 10 % IN SOLN
2.0000 mL | Freq: Four times a day (QID) | RESPIRATORY_TRACT | Status: DC
  Filled 2018-11-01 (×2): qty 4

## 2018-11-01 MED ORDER — POTASSIUM CHLORIDE CRYS ER 20 MEQ PO TBCR
40.0000 meq | EXTENDED_RELEASE_TABLET | Freq: Four times a day (QID) | ORAL | Status: AC
  Administered 2018-11-01 (×2): 40 meq via ORAL
  Filled 2018-11-01 (×2): qty 2

## 2018-11-01 MED ORDER — PANTOPRAZOLE SODIUM 40 MG IV SOLR
40.0000 mg | INTRAVENOUS | Status: DC
  Administered 2018-11-01: 40 mg via INTRAVENOUS
  Filled 2018-11-01: qty 40

## 2018-11-01 MED ORDER — THEOPHYLLINE ER 200 MG PO CP24
300.0000 mg | ORAL_CAPSULE | Freq: Two times a day (BID) | ORAL | Status: DC
  Administered 2018-11-01 – 2018-11-09 (×13): 300 mg via ORAL
  Filled 2018-11-01 (×19): qty 1

## 2018-11-01 MED ORDER — ENSURE ENLIVE PO LIQD: 237.0000 mL | Freq: Two times a day (BID) | ORAL | Status: DC

## 2018-11-01 MED ORDER — BUDESONIDE 0.25 MG/2ML IN SUSP
0.2500 mg | Freq: Two times a day (BID) | RESPIRATORY_TRACT | Status: DC
  Administered 2018-11-01 – 2018-11-05 (×9): 0.25 mg via RESPIRATORY_TRACT
  Filled 2018-11-01 (×9): qty 2

## 2018-11-01 MED ORDER — ACETYLCYSTEINE 10 % IN SOLN
4.0000 mL | Freq: Four times a day (QID) | RESPIRATORY_TRACT | Status: DC
  Filled 2018-11-01 (×3): qty 4

## 2018-11-01 MED ORDER — ENALAPRIL MALEATE 5 MG PO TABS
5.0000 mg | ORAL_TABLET | Freq: Every day | ORAL | Status: DC
  Administered 2018-11-01 – 2018-11-02 (×2): 5 mg via ORAL
  Filled 2018-11-01 (×2): qty 1

## 2018-11-01 MED ORDER — FUROSEMIDE 10 MG/ML IJ SOLN
40.0000 mg | Freq: Every day | INTRAMUSCULAR | Status: DC
  Administered 2018-11-01 – 2018-11-02 (×2): 40 mg via INTRAVENOUS
  Filled 2018-11-01 (×2): qty 4

## 2018-11-01 MED ORDER — HYDROXYZINE HCL 10 MG PO TABS
10.0000 mg | ORAL_TABLET | Freq: Every day | ORAL | Status: DC
  Administered 2018-11-01 – 2018-11-08 (×7): 10 mg via ORAL
  Filled 2018-11-01 (×10): qty 1

## 2018-11-01 MED ORDER — TRAZODONE HCL 100 MG PO TABS
100.0000 mg | ORAL_TABLET | Freq: Every day | ORAL | Status: DC
  Administered 2018-11-01 – 2018-11-08 (×8): 100 mg via ORAL
  Filled 2018-11-01 (×8): qty 1

## 2018-11-01 MED ORDER — HYDROXYZINE HCL 10 MG PO TABS: 10.0000 mg | ORAL_TABLET | Freq: Every day | ORAL | Status: DC

## 2018-11-01 MED ORDER — ASPIRIN 81 MG PO CHEW
81.0000 mg | CHEWABLE_TABLET | Freq: Every day | ORAL | Status: DC
  Administered 2018-11-01 – 2018-11-09 (×8): 81 mg via ORAL
  Filled 2018-11-01 (×9): qty 1

## 2018-11-01 MED ORDER — TAMSULOSIN HCL 0.4 MG PO CAPS
0.4000 mg | ORAL_CAPSULE | Freq: Every day | ORAL | Status: DC
  Administered 2018-11-01 – 2018-11-09 (×6): 0.4 mg via ORAL
  Filled 2018-11-01 (×9): qty 1

## 2018-11-01 MED ORDER — CITALOPRAM HYDROBROMIDE 20 MG PO TABS: 20.0000 mg | ORAL_TABLET | Freq: Every day | ORAL | Status: DC

## 2018-11-01 NOTE — ED Notes (Signed)
Per April B, RN, patient is in route to floor.

## 2018-11-01 NOTE — Consult Note (Signed)
Consultation Note Date: 11/01/2018   Patient Name: Earl Clay  DOB: Sep 04, 1940  MRN: [id number]  Age / Sex: 78 y.o., male  PCP: [name], MD Referring Physician: Epifanio Lesches, MD  Reason for Consultation: Establishing goals of care and Psychosocial/spiritual support  HPI/Patient Profile: 78 y.o. male  with past medical history of coronary artery disease, diabetes, obstructive sleep apnea (CPAP), COPD (home oxygen 2-4L), PTSD, anxiety, depression, lung cancer x2 s/p chemoradiation therapy, hypertension, and GERD admitted on 10/31/2018 with acute on chronic respiratory failure with hypoxia secondary to community-acquired pneumonia on lung cancer.   Clinical Assessment and Goals of Care: [name] is sitting up in bed.  He greets me making and mostly keeping eye contact.  BiPAP in place.  He appears acutely/chronically ill and frail.  There is no family at bedside at this time due to visitor restrictions.  Mr. Raliegh Ip has told nursing staff that he would like to go home.  I share with him that if his goal is to return home, focusing on comfort, not returning to the hospital we will set that in place for him.  He quickly shakes his head no that he does not want to go home.  I encouraged him to accept treatment for 24 to 48 hours, see how the medication and treatment plan can help him.  He agrees.  Conference with nursing staff and respiratory therapy related to patient condition, goals of care.   HCPOA    NEXT OF KIN -daughter, Hennie Duos    SUMMARY OF RECOMMENDATIONS   24 to 48 hours for outcomes. Continue to treat the treatable. DNR/DNI  Code Status/Advance Care Planning:  DNR  Symptom Management:   Per hospitalist/critical care, no additional needs at this time.  Palliative Prophylaxis:   Frequent Pain Assessment and Turn Reposition  Additional Recommendations  (Limitations, Scope, Preferences):  Treat the treatable, no CPR, no intubation  Psycho-social/Spiritual:   Desire for further Chaplaincy support:no  Additional Recommendations: Caregiving  Support/Resources and Education on Hospice  Prognosis:   Unable to determine, guarded at this point.  Based on outcomes.  Discharge Planning: To be determined, based on outcomes.      Primary Diagnoses: Present on Admission: . PNA (pneumonia) . Pneumonia   I have reviewed the medical record, interviewed the patient and family, and examined the patient. The following aspects are pertinent.  Past Medical History:  Diagnosis Date  . Asthma   . CAD (coronary artery disease)   . COPD (chronic obstructive pulmonary disease) ([place])   . Depression   . Diabetes mellitus without complication ([place])   . Difficult intubation   . GERD (gastroesophageal reflux disease)   . Hypertension   . Lung cancer ([place])   . 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 Pancreatic mass   . PTSD (post-traumatic stress disorder)   . Sleep apnea    CPAP with O2 2L/m at night   Social History   Socioeconomic History  . Marital status: Single    Spouse  name: Not on file  . Number of children: Not on file  . Years of education: Not on file  . Highest education level: Not on file  Occupational History  . Not on file  Social Needs  . Financial resource strain: Not on file  . Food insecurity:    Worry: Not on file    Inability: Not on file  . Transportation needs:    Medical: Not on file    Non-medical: Not on file  Tobacco Use  . Smoking status: Former Research scientist (life sciences)  . Smokeless tobacco: Never Used  Substance and Sexual Activity  . Alcohol use: Not on file  . Drug use: Not on file  . Sexual activity: Not on file  Lifestyle  . Physical activity:    Days per week: Not on file    Minutes per session: Not on file  . Stress: Not on file  Relationships  . Social connections:    Talks on phone: Not on file     Gets together: Not on file    Attends religious service: Not on file    Active member of club or organization: Not on file    Attends meetings of clubs or organizations: Not on file    Relationship status: Not on file  Other Topics Concern  . Not on file  Social History Narrative  . Not on file   Family History  Problem Relation Age of Onset  . Hypertension Mother   . Congestive Heart Failure Father    Scheduled Meds: . aspirin  81 mg Oral Daily  . budesonide (PULMICORT) nebulizer solution  0.25 mg Nebulization Q12H  . diltiazem  180 mg Oral Daily  . enalapril  5 mg Oral Daily  . enoxaparin (LOVENOX) injection  40 mg Subcutaneous Q24H  . feeding supplement (ENSURE ENLIVE)  237 mL Oral BID BM  . guaiFENesin-codeine  10 mL Oral Q6H  . hydrOXYzine  10 mg Oral QHS  . insulin aspart  0-15 Units Subcutaneous Q4H  . ipratropium-albuterol  3 mL Nebulization Q6H  . methylPREDNISolone (SOLU-MEDROL) injection  40 mg Intravenous Q12H  . [START ON 11/02/2018] pantoprazole  40 mg Oral Daily  . QUEtiapine  100 mg Oral QHS  . sodium chloride flush  3 mL Intravenous Q12H  . tamsulosin  0.4 mg Oral QPC breakfast  . theophylline  300 mg Oral BID  . traZODone  100 mg Oral QHS   Continuous Infusions: . sodium chloride    . azithromycin    . cefTRIAXone (ROCEPHIN)  IV    . nitroGLYCERIN Stopped (11/01/18 0204)   PRN Meds:.sodium chloride, morphine injection, sodium chloride flush Medications Prior to Admission:  Prior to Admission medications   Medication Sig Start Date End Date Taking? Authorizing Provider  albuterol (PROVENTIL HFA;VENTOLIN HFA) 108 (90 Base) MCG/ACT inhaler Inhale 2 puffs into the lungs 2 (two) times daily.    Yes [provider]  buPROPion (WELLBUTRIN) 100 MG tablet Take 100 mg by mouth 2 (two) times daily.   Yes [provider]  cetirizine (ZYRTEC) 10 MG tablet Take 10 mg by mouth every evening.    Yes [provider]  diltiazem (DILACOR XR)  180 MG 24 hr capsule Take 180 mg by mouth daily.   Yes [provider]  enalapril (VASOTEC) 5 MG tablet Take 5 mg by mouth daily.    Yes [provider]  hydrochlorothiazide (HYDRODIURIL) 25 MG tablet Take 25 mg by mouth daily.    Yes [provider]  hydrOXYzine (ATARAX/VISTARIL) 10 MG tablet Take 10 mg by mouth at bedtime.   Yes [provider]  insulin aspart (NOVOLOG) 100 UNIT/ML injection Inject 10-20 Units into the skin 3 (three) times daily with meals as needed for high blood sugar. Pt uses as needed per sliding scale.    Yes [provider]  insulin detemir (LEVEMIR) 100 UNIT/ML injection Inject 20 Units into the skin daily.    Yes [provider]  magnesium oxide (MAG-OX) 400 MG tablet Take 400 mg by mouth daily.    Yes [provider]  PARoxetine (PAXIL) 40 MG tablet Take 40 mg by mouth 2 (two) times daily.    Yes [provider]  pravastatin (PRAVACHOL) 80 MG tablet Take 80 mg by mouth at bedtime.    Yes [provider]  QUEtiapine (SEROQUEL) 100 MG tablet Take 1 tablet by mouth at bedtime. 09/22/18  Yes [provider]  sildenafil (VIAGRA) 100 MG tablet Take 100 mg by mouth daily as needed for erectile dysfunction.    Yes [provider]  tamsulosin (FLOMAX) 0.4 MG CAPS capsule Take 0.4 mg by mouth at bedtime.   Yes [provider]  theophylline (THEO-24) 300 MG 24 hr capsule Take 300 mg by mouth 2 (two) times daily.    Yes [provider]  tiotropium (SPIRIVA) 18 MCG inhalation capsule Place 18 mcg into inhaler and inhale daily.    Yes [provider]  tiZANidine (ZANAFLEX) 4 MG tablet Take 4 mg by mouth at bedtime.   Yes [provider]  traZODone (DESYREL) 100 MG tablet Take 100 mg by mouth at bedtime.   Yes [provider]  aspirin 81 MG chewable tablet Chew 81 mg by mouth daily.    [provider]  azithromycin (ZITHROMAX) 500 MG  tablet Take 1 tablet (500 mg total) by mouth daily. Patient not taking: Reported on 10/31/2018 08/17/18   Otila Back, MD  citalopram (CELEXA) 20 MG tablet Take 20 mg by mouth daily.     [provider]  feeding supplement, ENSURE ENLIVE, (ENSURE ENLIVE) LIQD Take 237 mLs by mouth 2 (two) times daily between meals. 08/17/18   Stark Jock Jude, MD  furosemide (LASIX) 20 MG tablet Take 20 mg by mouth daily.     [provider]  polyethylene glycol (MIRALAX / GLYCOLAX) packet Take 17 g by mouth daily as needed for mild constipation. Patient not taking: Reported on 10/31/2018 08/17/18   Otila Back, MD  predniSONE (DELTASONE) 10 MG tablet 40mg  daily for 3 days  Then 30mg  daily for 3 days Then 20mg  daily for 3 days Then 10mg  daily for 3 days Patient not taking: Reported on 10/31/2018 08/17/18   Otila Back, MD   Allergies  Allergen Reactions  . Ampicillin Hives and Other (See Comments)    Has patient had a PCN reaction causing immediate rash, facial/tongue/throat swelling, SOB or lightheadedness with hypotension: No Has patient had a PCN reaction causing severe rash involving mucus membranes or skin necrosis: No Has patient had a PCN reaction that required hospitalization No Has patient had a PCN reaction occurring within the last 10 years: No If all of the above answers are "NO", then may proceed with Cephalosporin use.  . Lorazepam     Worsening confusion  . Penicillins Hives and Other (See Comments)    Has patient had a PCN reaction causing immediate rash, facial/tongue/throat swelling, SOB or lightheadedness with hypotension: No Has patient had a PCN  reaction causing severe rash involving mucus membranes or skin necrosis: No Has patient had a PCN reaction that required hospitalization No Has patient had a PCN reaction occurring within the last 10 years: No If all of the above answers are "NO", then may proceed with Cephalosporin use.  . Zyban [Bupropion] Rash   Review of Systems   Unable to perform ROS: Acuity of condition    Physical Exam Vitals signs and nursing note reviewed.  Constitutional:      General: He is not in acute distress.    Appearance: He is ill-appearing.     Comments: Makes and mostly keeps eye contact, appears acutely/chronically ill and frail.  HENT:     Head: Atraumatic.     Comments: Temporal wasting Cardiovascular:     Rate and Rhythm: Normal rate.  Pulmonary:     Effort: Respiratory distress present.     Comments: BiPAP in place, but able to make needs known Musculoskeletal:        General: No swelling.  Skin:    General: Skin is warm and dry.  Neurological:     General: No focal deficit present.     Mental Status: He is alert and oriented to person, place, and time.  Psychiatric:        Mood and Affect: Mood normal.        Behavior: Behavior normal.     Vital Signs: BP 126/70   Pulse (!) 103   Temp 97.9 F (36.6 C) (Temporal)   Resp 19   Ht 5\' 6"  (1.676 m)   Wt 74.8 kg   SpO2 99%   BMI 26.63 kg/m  Pain Scale: 0-10   Pain Score: 0-No pain   SpO2: SpO2: 99 % O2 Device:SpO2: 99 % O2 Flow Rate: .O2 Flow Rate (L/min): 15 L/min  IO: Intake/output summary:   Intake/Output Summary (Last 24 hours) at 11/01/2018 1435 Last data filed at 11/01/2018 0204 Gross per 24 hour  Intake 401.57 ml  Output 2030 ml  Net -1628.43 ml    LBM:   Baseline Weight: Weight: 74.8 kg Most recent weight: Weight: 74.8 kg     Palliative Assessment/Data:   Flowsheet Rows     Most Recent Value  Intake Tab  Referral Department  Hospitalist  Unit at Time of Referral  Intermediate Care Unit  Palliative Care Primary Diagnosis  Pulmonary  Date Notified  10/31/18  Palliative Care Type  Return patient Palliative Care  Reason for referral  Clarify Goals of Care  Date of Admission  10/31/18  Date first seen by Palliative Care  11/01/18  # of days Palliative referral response time  1 Day(s)  # of days IP prior to Palliative referral  0   Clinical Assessment  Pain Max last 24 hours  Not able to report  Pain Min Last 24 hours  Not able to report  Dyspnea Max Last 24 Hours  Not able to report  Dyspnea Min Last 24 hours  Not able to report  Psychosocial & Spiritual Assessment  Palliative Care Outcomes      Time In: 1410 Time Out: 1500 Time Total: 50 minutes  Greater than 50%  of this time was spent counseling and coordinating care related to the above assessment and plan.  Signed by: Drue Novel, NP   Please contact Palliative Medicine Team phone at [phone] for questions and concerns.  For individual provider: See Shea Evans

## 2018-11-01 NOTE — Progress Notes (Signed)
[facility] [place] [facility] [place] NAME: Daegon Deiss    MR#:  [id number]  DATE OF BIRTH:  [birth year]  SUBJECTIVE: Patient is seen at bedside, admitted for acute respiratory failure secondary to pneumonia, required BiPAP on admission.  CHIEF COMPLAINT:   Chief Complaint  Patient presents with  . Respiratory Distress    REVIEW OF SYSTEMS:   ROS CONSTITUTIONAL: No fever, fatigue or weakness.  EYES: No blurred or double vision.  EARS, NOSE, AND THROAT: No tinnitus or ear pain.  RESPIRATORY: Worsening respiratory status with cough, shortness of breath.   CARDIOVASCULAR: No chest pain, orthopnea, edema.  GASTROINTESTINAL: No nausea, vomiting, diarrhea or abdominal pain.  GENITOURINARY: No dysuria, hematuria.  ENDOCRINE: No polyuria, nocturia,  HEMATOLOGY: No anemia, easy bruising or bleeding SKIN: No rash or lesion. MUSCULOSKELETAL: No joint pain or arthritis.   NEUROLOGIC: No tingling, numbness, weakness.  PSYCHIATRY: No anxiety or depression.   DRUG ALLERGIES:   Allergies  Allergen Reactions  . Ampicillin Hives and Other (See Comments)    Has patient had a PCN reaction causing immediate rash, facial/tongue/throat swelling, SOB or lightheadedness with hypotension: No Has patient had a PCN reaction causing severe rash involving mucus membranes or skin necrosis: No Has patient had a PCN reaction that required hospitalization No Has patient had a PCN reaction occurring within the last 10 years: No If all of the above answers are "NO", then may proceed with Cephalosporin use.  . Lorazepam     Worsening confusion  . Penicillins Hives and Other (See Comments)    Has patient had a PCN reaction causing immediate rash, facial/tongue/throat swelling, SOB or lightheadedness with hypotension: No Has patient had a PCN reaction causing severe rash involving mucus membranes or skin necrosis: No Has patient had a PCN reaction that required  hospitalization No Has patient had a PCN reaction occurring within the last 10 years: No If all of the above answers are "NO", then may proceed with Cephalosporin use.  . Zyban [Bupropion] Rash    VITALS:  Blood pressure 126/70, pulse (!) 103, temperature 97.9 F (36.6 C), temperature source Temporal, resp. rate 19, height 5\' 6"  (1.676 m), weight 74.8 kg, SpO2 99 %.  PHYSICAL EXAMINATION:  GENERAL:  78 y.o.-year-old patient lying in the bed with no acute distress.  EYES: Pupils equal, round, reactive to light and accommodation. No scleral icterus. Extraocular muscles intact.  HEENT: Head atraumatic, normocephalic. Oropharynx and nasopharynx clear.  NECK:  Supple, no jugular venous distention. No thyroid enlargement, no tenderness.  LUNGS: Diminished air entry bilaterally.Marland Kitchen  CARDIOVASCULAR: S1, S2 normal. No murmurs, rubs, or gallops.  ABDOMEN: Soft, nontender, nondistended. Bowel sounds present. No organomegaly or mass.  EXTREMITIES: No pedal edema, cyanosis, or clubbing.  NEUROLOGIC: Cranial nerves II through XII are intact. Muscle strength 5/5 in all extremities. Sensation intact. Gait not checked.  PSYCHIATRIC: The patient is alert and oriented x 3.  SKIN: No obvious rash, lesion, or ulcer.    LABORATORY PANEL:   CBC Recent Labs  Lab 11/01/18 0531  WBC 7.1  HGB 11.5*  HCT 37.4*  PLT 221   ------------------------------------------------------------------------------------------------------------------  Chemistries  Recent Labs  Lab 11/01/18 0531  NA 140  K 3.2*  CL 96*  CO2 32  GLUCOSE 161*  BUN 22  CREATININE 1.34*  CALCIUM 9.0  MG 2.0   ------------------------------------------------------------------------------------------------------------------  Cardiac Enzymes Recent Labs  Lab 10/31/18 1808  TROPONINI 0.08*   ------------------------------------------------------------------------------------------------------------------  RADIOLOGY:  Dg Chest  Portable 1 View  Result Date: 10/31/2018 CLINICAL DATA:  79 year old male with lung cancer and COPD on home BiPAP. Shortness of breath. EXAM: PORTABLE CHEST 1 VIEW COMPARISON:  Chest CT 10/26/2018 and earlier. FINDINGS: Portable AP upright view at 1830 hours. Right suprahilar masslike opacity redemonstrated. Stable lung volumes since February. Increased bilateral hilar density since February, likely stable from the recent CT. Stable cardiac size. Visualized tracheal air column is within normal limits. No pneumothorax. Persistent small bilateral pleural effusions. Patchy and indistinct increased right basilar opacity since February, might be stable from the recent CT. Negative visible bowel gas pattern. IMPRESSION: 1. Similar to the recent chest CT 10/26/2018. Right suprahilar masslike opacity and Small bilateral pleural effusions. 2. Patchy and indistinct right lung base opacity, questionably increased from the recent CT, consider acute infectious exacerbation. Electronically Signed   By: Genevie Ann M.D.   On: 10/31/2018 19:07    EKG:   Orders placed or performed during the hospital encounter of 10/31/18  . ED EKG  . ED EKG  . EKG 12-Lead  . 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 Rogue Bussing. 2.  COPD exacerbation, continue oxygen, steroids.  Continue bronchodilators with Pulmicort, DuoNeb's. 3.  Chronic respiratory failure due to his lung cancer, COPD, patient is on theophylline, followed by Dr. Raul Del. 4.  hypokalemia: Replace potassium. 5.  Acute renal failure due to dehydration, pneumonia, follow closely.    All the records are reviewed and case  discussed with Care Management/Social Workerr. Management plans discussed with the patient, family and they are in agreement.  CODE STATUS: DNR  TOTAL TIME TAKING CARE OF THIS PATIENT: 38 minutes.   POSSIBLE D/C IN 1-2 DAYS, DEPENDING ON CLINICAL CONDITION.   Epifanio Lesches M.D on 11/01/2018 at 1:05 PM  Between 7am to 6pm - Pager - [phone]  After 6pm go to [email or web address] - password [email or web address] [facility]  Office  [phone]  CC: Primary care physician; [name], MD   Note: This dictation was prepared with Dragon dictation along with smaller phrase technology. Any transcriptional errors that result from this process are unintentional.

## 2018-11-01 NOTE — Consult Note (Addendum)
PULMONARY / CRITICAL CARE MEDICINE  Name: Earl Clay MRN: [id number] DOB: [birth year]    LOS: 1  Referring Provider: Dr. 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  He was also severely hypertensive with systolic blood pressure in the 200s.  His ED work-up was significant for mild leukocytosis of 15.8K, chest x-ray with right suprahilar masslike opacity in small bilateral AP effusions and right lung base opacity suggestive of pneumonia.  CT chest without contrast showed left hilar mass that is extending into the left upper lobe and the posterior aspect of the left upper lobe, bilateral pleural effusions, diffuse bronchial wall thickening, and right upper lobe nodules.  COVID screen was negative.  He is being admitted to the ICU for BiPAP and nitroglycerin infusion. It is unclear if patient is currently undergoing any therapy for lung CA.  Per Dr. Aletha Halim last note, current therapy was indicated as surveillance.  Past Medical History:  Diagnosis Date  . Asthma   . CAD (coronary artery disease)   . COPD (chronic obstructive pulmonary disease) ([place])   . Depression   . Diabetes mellitus without complication ([place])   . Difficult intubation   . GERD (gastroesophageal reflux disease)   . Hypertension   . Lung cancer ([place])   . 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 Pancreatic mass   . PTSD (post-traumatic stress disorder)   . Sleep apnea    CPAP with O2 2L/m at night   Past Surgical History:   Procedure Laterality Date  . HERNIA REPAIR Right    right inguinal hernia repair  . KNEE SURGERY Right    right knee repair  . UPPER ESOPHAGEAL ENDOSCOPIC ULTRASOUND (EUS) N/A 09/20/2015   Procedure: UPPER ESOPHAGEAL ENDOSCOPIC ULTRASOUND (EUS);  Surgeon: [name], MD;  Location: [facility] ENDOSCOPY;  Service: Endoscopy;  Laterality: N/A;   Prior to Admission medications   Medication Sig Start Date End Date Taking? Authorizing Provider  amLODipine (NORVASC) 5 MG tablet Take 5 mg by mouth daily.   Yes [provider]  clopidogrel (PLAVIX) 75 MG tablet Take 75 mg by mouth daily.   Yes [provider]  donepezil (ARICEPT) 5 MG tablet Take 1 tablet (5 mg total) by mouth at bedtime. 03/01/18 04/10/18 Yes Sowles, Drue Stager, MD  empagliflozin (JARDIANCE) 25 MG TABS tablet Take 25 mg by mouth daily.   Yes [provider]  glycopyrrolate (ROBINUL) 1 MG tablet Take 1 mg by mouth 2 (two) times daily.   Yes [provider]  insulin aspart (NOVOLOG FLEXPEN) 100 UNIT/ML FlexPen Inject 12 Units into the skin 2 (two) times daily.   Yes [provider]  insulin aspart (NOVOLOG) 100 UNIT/ML FlexPen Inject 18 Units into the skin daily. At 1700   Yes [provider]  Insulin Degludec-Liraglutide (XULTOPHY) 100-3.6 UNIT-MG/ML SOPN Inject 50 Units into the skin daily.   Yes [provider]  levETIRAcetam (KEPPRA) 500 MG tablet Take 500 mg by mouth 2 (two) times daily.   Yes [provider]  lipase/protease/amylase (CREON) 12000 units CPEP capsule Take 6,000  Units by mouth 3 (three) times daily before meals.   Yes [provider]  lipase/protease/amylase (CREON) 12000 units CPEP capsule Take 3,000 Units by mouth at bedtime. With snack   Yes [provider]  lisinopril (PRINIVIL,ZESTRIL) 5 MG tablet Take 5 mg by mouth daily.   Yes [provider]  metoprolol succinate (TOPROL-XL) 25 MG 24 hr tablet Take 1 tablet (25  mg total) by mouth daily. 03/01/18  Yes Sowles, Drue Stager, MD  rosuvastatin (CRESTOR) 40 MG tablet Take 1 tablet (40 mg total) by mouth daily. 03/01/18 04/10/18 Yes Steele Sizer, MD  aspirin EC 81 MG tablet Take 81 mg by mouth daily.    [provider]  famotidine (PEPCID) 20 MG tablet Take 1 tablet (20 mg total) by mouth 2 (two) times daily. 03/01/18 03/31/18  Steele Sizer, MD  gabapentin (NEURONTIN) 300 MG capsule Take 1 capsule (300 mg total) by mouth 2 (two) times daily. 03/01/18 03/31/18  Steele Sizer, MD  insulin glargine (LANTUS) 100 UNIT/ML injection Inject 0.1 mLs (10 Units total) into the skin daily. 03/01/18 03/31/18  Steele Sizer, MD  lacosamide 100 MG TABS Take 1 tablet (100 mg total) by mouth 2 (two) times daily. Patient not taking: Reported on 04/10/2018 07/17/17   Fritzi Mandes, MD  promethazine (PHENERGAN) 12.5 MG tablet Take 1 tablet (12.5 mg total) by mouth every 6 (six) hours as needed for nausea or vomiting. Patient not taking: Reported on 04/10/2018 05/05/17   Stark Klein, MD  sertraline (ZOLOFT) 25 MG tablet Take 1 tablet (25 mg total) by mouth daily. Patient not taking: Reported on 04/10/2018 03/01/18   Steele Sizer, MD   Allergies Allergies  Allergen Reactions  . Ampicillin Hives and Other (See Comments)    Has patient had a PCN reaction causing immediate rash, facial/tongue/throat swelling, SOB or lightheadedness with hypotension: No Has patient had a PCN reaction causing severe rash involving mucus membranes or skin necrosis: No Has patient had a PCN reaction that required hospitalization No Has patient had a PCN reaction occurring within the last 10 years: No If all of the above answers are "NO", then may proceed with Cephalosporin use.  . Lorazepam     Worsening confusion  . Penicillins Hives and Other (See Comments)    Has patient had a PCN reaction causing immediate rash, facial/tongue/throat swelling, SOB or lightheadedness with hypotension: No Has  patient had a PCN reaction causing severe rash involving mucus membranes or skin necrosis: No Has patient had a PCN reaction that required hospitalization No Has patient had a PCN reaction occurring within the last 10 years: No If all of the above answers are "NO", then may proceed with Cephalosporin use.  . Zyban [Bupropion] Rash    Family History Family History  Problem Relation Age of Onset  . Hypertension Mother   . Congestive Heart Failure Father    Social History  reports that he has quit smoking. He has never used smokeless tobacco. No history on file for alcohol and drug.  Review Of Systems: Unable to obtain as patient is on continuous BiPAP  VITAL SIGNS: BP (!) 89/55   Pulse 87   Temp 97.7 F (36.5 C) (Axillary)   Resp 18   Ht 5\' 6"  (1.676 m)   Wt 74.8 kg   SpO2 90%   BMI 26.63 kg/m   HEMODYNAMICS:    VENTILATOR SETTINGS: FiO2 (%):  [70 %] 70 %  INTAKE / OUTPUT: No intake/output data recorded.  PHYSICAL EXAMINATION: General: Chronically  ill looking, in moderate respiratory distress HEENT: PERRLA, trachea midline, no JVD Neuro: Alert and oriented x2, moves all extremities, follows basic commands Cardiovascular: Apical pulse regular, S1-S2, no murmur regurg or gallop, +2 pulses bilaterally Lungs: Increased work of breathing, coughing profusely, on BiPAP, breath sounds diminished in all lung fields, mild expiratory wheezes Abdomen: Nondistended, normal bowel sounds in all 4 quadrants, palpation reveals no organomegaly Musculoskeletal: Positive range of motion, no joint deformities Skin: Warm and dry  LABS:  BMET Recent Labs  Lab 10/31/18 1808  NA 139  K 3.7  CL 97*  CO2 28  BUN 20  CREATININE 1.16  GLUCOSE 196*    Electrolytes Recent Labs  Lab 10/31/18 1808  CALCIUM 9.6    CBC Recent Labs  Lab 10/31/18 1808  WBC 15.8*  HGB 13.3  HCT 44.2  PLT 312    Coag's No results for input(s): APTT, INR in the last 168 hours.  Sepsis  Markers Recent Labs  Lab 10/31/18 1808 10/31/18 1811 10/31/18 2040  LATICACIDVEN 2.6*  --  2.3*  PROCALCITON  --  <0.10  --     ABG Recent Labs  Lab 11/01/18 0148  PHART 7.46*  PCO2ART 50*  PO2ART 66*    Liver Enzymes No results for input(s): AST, ALT, ALKPHOS, BILITOT, ALBUMIN in the last 168 hours.  Cardiac Enzymes Recent Labs  Lab 10/31/18 1808  TROPONINI 0.08*    Glucose Recent Labs  Lab 10/31/18 2230 10/31/18 2350 11/01/18 0351  GLUCAP 205* 204* 141*    Imaging Dg Chest Portable 1 View  Result Date: 10/31/2018 CLINICAL DATA:  78 year old male with lung cancer and COPD on home BiPAP. Shortness of breath. EXAM: PORTABLE CHEST 1 VIEW COMPARISON:  Chest CT 10/26/2018 and earlier. FINDINGS: Portable AP upright view at 1830 hours. Right suprahilar masslike opacity redemonstrated. Stable lung volumes since February. Increased bilateral hilar density since February, likely stable from the recent CT. Stable cardiac size. Visualized tracheal air column is within normal limits. No pneumothorax. Persistent small bilateral pleural effusions. Patchy and indistinct increased right basilar opacity since February, might be stable from the recent CT. Negative visible bowel gas pattern. IMPRESSION: 1. Similar to the recent chest CT 10/26/2018. Right suprahilar masslike opacity and Small bilateral pleural effusions. 2. Patchy and indistinct right lung base opacity, questionably increased from the recent CT, consider acute infectious exacerbation. Electronically Signed   By: Genevie Ann M.D.   On: 10/31/2018 19:07    STUDIES:  None  CULTURES: Blood cultures x2 Respiratory panel  ANTIBIOTICS: Azithromycin Ceftriaxone  SIGNIFICANT EVENTS: 10/31/2018: Admitted  LINES/TUBES: Peripheral IV  DISCUSSION: 78 year old male with newly diagnosed lung cancer and COPD presenting with postobstructive pneumonia, acute COPD exacerbation and acute hypoxic respiratory failure secondary to  pneumonia and worsening lung carcinoma  ASSESSMENT Acute hypoxic respiratory failure Primary carcinoma of the  left and right upper lobe lung lobes Acute COPD exacerbation Hypertensive urgency Colonic mass Type 2 diabetes mellitus   PLAN Continues BiPAP and titrate to nasal cannula as tolerated Nebulized bronchodilators and inhaled steroids IV steroids Antibiotics as above Follow-up cultures Follow-up with oncology regarding treatment plan for lung cancer Palliative care consult for goals of care Resume all home medications Blood glucose monitoring with sliding scale insulin coverage Titrate off nitroglycerin infusion Hemodynamic monitoring per ICU protocol  Best Practice: Code Status: DNR Diet: N.p.o. while on BiPAP and carb modified diet when off BiPAP GI prophylaxis: Protonix VTE prophylaxis: SCDs and Lovenox  FAMILY  - Updates: No family  at bedside.  Will update when available.  Magdalene S. Tukov-Yual ANP-BC [facility] [place] Pager [phone] or [phone]  NB: This document was prepared using Dragon voice recognition software and may include unintentional dictation errors.    11/01/2018, 5:18 AM

## 2018-11-01 NOTE — Consult Note (Signed)
[place] NOTE  Patient Care Team: [name], MD as PCP - General (Internal Medicine) Estevan Oaks, NP as Nurse Practitioner (Hospice and Palliative Medicine) Erby Pian, MD as Referring Physician (Specialist) Noreene Filbert, MD as Referring Physician (Radiation Oncology)  CHIEF COMPLAINTS/PURPOSE OF CONSULTATION:  Lung cancer  HISTORY OF PRESENTING ILLNESS:  Earl Clay 78 y.o.  male with multiple medical problems including advanced COPD/chronic respiratory failure 4 to 5 L of oxygen at home; prior history of lung cancer status post radiation-currently on surveillance.  Patient had a recent surveillance CT scan in April 2020 that showed-progressive infiltrative changes in left upper lobe concerning for recurrence.  Patient is supposed to get a follow-up PET scan as outpatient to discuss further management options.  However patient noted to have worsening shortness of breath worsening cough over the last few days led to this hospitalization.  Chest x-ray showed patchy indistinct right lung base opacity worsening from the recent prior CT scan.  Patient was noted to be more hypoxic; currently in ICU needing BiPAP support.  Patient had been needing BiPAP almost 90% times last night.   Patient is eager to go home.  Otherwise denies any chest pain.   Review of Systems  Constitutional: Positive for malaise/fatigue and weight loss. Negative for chills, diaphoresis and fever.  HENT: Negative for nosebleeds and sore throat.   Eyes: Negative for double vision.  Respiratory: Positive for cough, sputum production, shortness of breath and wheezing. Negative for hemoptysis.   Cardiovascular: Negative for chest pain, palpitations, orthopnea and leg swelling.  Gastrointestinal: Positive for nausea. Negative for abdominal pain, blood in stool, constipation, diarrhea, heartburn, melena and vomiting.  Genitourinary: Negative for dysuria, frequency and  urgency.  Musculoskeletal: Negative for back pain and joint pain.  Skin: Negative.  Negative for itching and rash.  Neurological: Negative for dizziness, tingling, focal weakness, weakness and headaches.  Endo/Heme/Allergies: Bruises/bleeds easily.  Psychiatric/Behavioral: Negative for depression. The patient is nervous/anxious. The patient does not have insomnia.      MEDICAL HISTORY:  Past Medical History:  Diagnosis Date  . Asthma   . CAD (coronary artery disease)   . COPD (chronic obstructive pulmonary disease) ([place])   . Depression   . Diabetes mellitus without complication ([place])   . Difficult intubation   . GERD (gastroesophageal reflux disease)   . Hypertension   . Lung cancer ([place])   . 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 Pancreatic mass   . PTSD (post-traumatic stress disorder)   . Sleep apnea    CPAP with O2 2L/m at night    SURGICAL HISTORY: Past Surgical History:  Procedure Laterality Date  . HERNIA REPAIR Right    right inguinal hernia repair  . KNEE SURGERY Right    right knee repair  . UPPER ESOPHAGEAL ENDOSCOPIC ULTRASOUND (EUS) N/A 09/20/2015   Procedure: UPPER ESOPHAGEAL ENDOSCOPIC ULTRASOUND (EUS);  Surgeon: [name], MD;  Location: [facility] ENDOSCOPY;  Service: Endoscopy;  Laterality: N/A;    SOCIAL HISTORY: Social History   Socioeconomic History  . Marital status: Single    Spouse name: Not on file  . Number of children: Not on file  . Years of education: Not on file  . Highest education level: Not on file  Occupational History  . Not on file  Social Needs  . Financial resource strain: Not on file  . Food insecurity:    Worry: Not on file    Inability: Not on  file  . Transportation needs:    Medical: Not on file    Non-medical: Not on file  Tobacco Use  . Smoking status: Former Research scientist (life sciences)  . Smokeless tobacco: Never Used  Substance and Sexual Activity  . Alcohol use: Not on file  . Drug use: Not on file  . Sexual  activity: Not on file  Lifestyle  . Physical activity:    Days per week: Not on file    Minutes per session: Not on file  . Stress: Not on file  Relationships  . Social connections:    Talks on phone: Not on file    Gets together: Not on file    Attends religious service: Not on file    Active member of club or organization: Not on file    Attends meetings of clubs or organizations: Not on file    Relationship status: Not on file  . Intimate partner violence:    Fear of current or ex partner: Not on file    Emotionally abused: Not on file    Physically abused: Not on file    Forced sexual activity: Not on file  Other Topics Concern  . Not on file  Social History Narrative  . Not on file    FAMILY HISTORY: Family History  Problem Relation Age of Onset  . Hypertension Mother   . Congestive Heart Failure Father     ALLERGIES:  is allergic to ampicillin; lorazepam; penicillins; and zyban [bupropion].  MEDICATIONS:  Current Facility-Administered Medications  Medication Dose Route Frequency Provider Last Rate Last Dose  . 0.9 %  sodium chloride infusion  250 mL Intravenous PRN Seals, Levada Dy H, NP      . acetylcysteine (MUCOMYST) 20 % nebulizer / oral solution 4 mL  4 mL Nebulization Q6H Nazari, Walid A, RPH   4 mL at 11/02/18 0746  . aspirin chewable tablet 81 mg  81 mg Oral Daily Tukov-Yual, Magdalene S, NP   81 mg at 11/01/18 1153  . azithromycin (ZITHROMAX) 500 mg in sodium chloride 0.9 % 250 mL IVPB  500 mg Intravenous Q24H Seals, Levada Dy H, NP 250 mL/hr at 11/01/18 2051 500 mg at 11/01/18 2051  . budesonide (PULMICORT) nebulizer solution 0.25 mg  0.25 mg Nebulization Q12H Tukov-Yual, Magdalene S, NP   0.25 mg at 11/02/18 0746  . cefTRIAXone (ROCEPHIN) 1 g in sodium chloride 0.9 % 100 mL IVPB  1 g Intravenous Q24H Seals, Angela H, NP 200 mL/hr at 11/01/18 1820 1 g at 11/01/18 1820  . diltiazem (CARDIZEM CD) 24 hr capsule 180 mg  180 mg Oral Daily Tukov-Yual, Magdalene S, NP    180 mg at 11/01/18 1154  . enalapril (VASOTEC) tablet 5 mg  5 mg Oral Daily Tukov-Yual, Magdalene S, NP   5 mg at 11/01/18 1154  . enoxaparin (LOVENOX) injection 40 mg  40 mg Subcutaneous Q24H Seals, Angela H, NP   40 mg at 11/01/18 2205  . feeding supplement (ENSURE ENLIVE) (ENSURE ENLIVE) liquid 237 mL  237 mL Oral BID BM Tukov-Yual, Magdalene S, NP      . furosemide (LASIX) injection 40 mg  40 mg Intravenous Daily Ottie Glazier, MD   40 mg at 11/01/18 1815  . guaiFENesin-codeine 100-10 MG/5ML solution 10 mL  10 mL Oral Q6H Tukov-Yual, Magdalene S, NP   10 mL at 11/02/18 0427  . hydrOXYzine (ATARAX/VISTARIL) tablet 10 mg  10 mg Oral QHS Tukov-Yual, Magdalene S, NP   10 mg at 11/01/18 2201  .  insulin aspart (novoLOG) injection 0-15 Units  0-15 Units Subcutaneous Q4H Tukov-Yual, Magdalene S, NP   3 Units at 11/02/18 0420  . ipratropium-albuterol (DUONEB) 0.5-2.5 (3) MG/3ML nebulizer solution 3 mL  3 mL Nebulization Q6H Seals, Angela H, NP   3 mL at 11/02/18 0746  . methylPREDNISolone sodium succinate (SOLU-MEDROL) 40 mg/mL injection 40 mg  40 mg Intravenous Q12H Tukov-Yual, Magdalene S, NP   40 mg at 11/02/18 0427  . morphine 2 MG/ML injection 2 mg  2 mg Intravenous Q4H PRN Tukov-Yual, Magdalene S, NP   2 mg at 11/01/18 0044  . nitroGLYCERIN 50 mg in dextrose 5 % 250 mL (0.2 mg/mL) infusion  0-200 mcg/min Intravenous Continuous Nance Pear, MD   Stopped at 11/01/18 2951  . pantoprazole (PROTONIX) EC tablet 40 mg  40 mg Oral Daily Cyndee Brightly [place],       . potassium chloride 10 mEq in 100 mL IVPB  10 mEq Intravenous Q1 Hr x 2 Awilda Bill, NP 100 mL/hr at 11/02/18 0818 10 mEq at 11/02/18 0818  . QUEtiapine (SEROQUEL) tablet 100 mg  100 mg Oral QHS Tukov-Yual, Magdalene S, NP   100 mg at 11/01/18 2158  . sodium chloride flush (NS) 0.9 % injection 3 mL  3 mL Intravenous Q12H Seals, Angela H, NP   3 mL at 11/01/18 2202  . sodium chloride flush (NS) 0.9 % injection 3 mL  3 mL Intravenous  PRN Seals, Levada Dy H, NP      . tamsulosin (FLOMAX) capsule 0.4 mg  0.4 mg Oral QPC breakfast Tukov-Yual, Magdalene S, NP   0.4 mg at 11/01/18 0846  . theophylline (THEO-24) 24 hr capsule 300 mg  300 mg Oral BID Tukov-Yual, Magdalene S, NP   300 mg at 11/01/18 2202  . traZODone (DESYREL) tablet 100 mg  100 mg Oral QHS Tukov-Yual, Magdalene S, NP   100 mg at 11/01/18 2200      .  PHYSICAL EXAMINATION:  Vitals:   11/02/18 0500 11/02/18 0600  BP: 110/74 (!) 100/59  Pulse: (!) 110 (!) 106  Resp: (!) 25 (!) 24  Temp:    SpO2: 93% 90%   Filed Weights   10/31/18 1817  Weight: 165 lb (74.8 kg)    Physical Exam  Constitutional: He is oriented to person, place, and time.  Frail-appearing Caucasian male patient.  Positive bleeding.  HENT:  Head: Normocephalic and atraumatic.  Mouth/Throat: Oropharynx is clear and moist. No oropharyngeal exudate.  Eyes: Pupils are equal, round, and reactive to light.  Neck: Normal range of motion. Neck supple.  Cardiovascular: Normal rate and regular rhythm.  Pulmonary/Chest: No respiratory distress. He has no wheezes.  Decreased air entry bilaterally.    Abdominal: Soft. Bowel sounds are normal. He exhibits no distension and no mass. There is no abdominal tenderness. There is no rebound and no guarding.  Musculoskeletal: Normal range of motion.        General: No tenderness or edema.  Neurological: He is alert and oriented to person, place, and time.  Skin: Skin is warm.  Psychiatric: Affect normal.     LABORATORY DATA:  I have reviewed the data as listed Lab Results  Component Value Date   WBC 11.0 (H) 11/02/2018   HGB 12.4 (L) 11/02/2018   HCT 41.1 11/02/2018   MCV 88.4 11/02/2018   PLT 245 11/02/2018   Recent Labs    04/05/18 1008 08/08/18 1231  10/31/18 1808 11/01/18 0531 11/02/18 0332  NA 140 131*   < >  139 140 142  K 4.0 3.2*   < > 3.7 3.2* 3.3*  CL 100 95*   < > 97* 96* 95*  CO2 30 25   < > 28 32 36*  GLUCOSE 160* 156*   <  > 196* 161* 160*  BUN 24* 34*   < > 20 22 27*  CREATININE 1.24 1.26*   < > 1.16 1.34* 1.51*  CALCIUM 9.6 8.8*   < > 9.6 9.0 9.1  GFRNONAA 56* 55*   < > >60 51* 45*  GFRAA >60 >60   < > >60 60* 52*  PROT 7.0 7.3  --   --   --   --   ALBUMIN 3.7 3.6  --   --   --   --   AST 14* 29  --   --   --   --   ALT 9 11  --   --   --   --   ALKPHOS 78 77  --   --   --   --   BILITOT 0.7 0.8  --   --   --   --    < > = values in this interval not displayed.    RADIOGRAPHIC STUDIES: I have personally reviewed the radiological images as listed and agreed with the findings in the report. Ct Chest Wo Contrast  Result Date: 10/26/2018 CLINICAL DATA:  78 year old male with history of left upper lobe lung cancer diagnosed in 2015 status post radiation therapy, completed in 2018. Worsening shortness of breath on oxygen. EXAM: CT CHEST WITHOUT CONTRAST TECHNIQUE: Multidetector CT imaging of the chest was performed following the standard protocol without IV contrast. COMPARISON:  Chest CT 03/29/2018. FINDINGS: Cardiovascular: Heart size is mildly enlarged. There is no significant pericardial fluid, thickening or pericardial calcification. There is aortic atherosclerosis, as well as atherosclerosis of the great vessels of the mediastinum and the coronary arteries, including calcified atherosclerotic plaque in the left anterior descending, left circumflex and right coronary arteries. Mediastinum/Nodes: Increasing soft tissue fullness in the left hilar region, incompletely evaluated on today's noncontrast CT examination, but this area currently measures approximately 5.0 x 3.8 cm (axial image 62 of series 2) as compared with 3.8 x 3.7 cm on 03/29/2018. No pathologically enlarged mediastinal or right hilar lymph nodes. Please note that accurate exclusion of hilar adenopathy is limited on noncontrast CT scans. Esophagus is unremarkable in appearance. Lungs/Pleura: Extending inferiorly from the mass-like area of fullness in  the left hilar region there is some marked soft tissue thickening and nodularity noted in the posterior aspect of the left upper lobe immediately adjacent to the left major fissure, best appreciated on axial image 75 of series 3, concerning for additional malignant tissue. Small left and trace right pleural effusions new compared to the prior examination. Previously noted areas of nodular architectural distortion in the right upper lobe appears similar to the prior examination, with the largest single nodule in this region measuring up to 10 mm (axial image 56 of series 3), stable. Diffuse bronchial wall thickening with moderate centrilobular and mild paraseptal emphysema. Upper Abdomen: Bilateral low-attenuation adrenal nodules, compatible with adenomas, measuring up to 1.9 cm on the left side, similar to prior studies. Aortic atherosclerosis. Musculoskeletal: There are no aggressive appearing lytic or blastic lesions noted in the visualized portions of the skeleton. IMPRESSION: 1. Today's study is highly concerning for local recurrence of disease with increasing conspicuity of mass-like area in the left hilar region extending into  the left upper lobe with what appears to be malignant soft tissue extending along the posterior aspect of the left upper lobe abutting the major fissure, as well as new bilateral pleural effusions (small on the left and trace on the right). 2. Previously noted nodularity and architectural distortion in the right upper lobe appear stable. 3. Aortic atherosclerosis, in addition to three-vessel coronary artery disease. Assessment for potential risk factor modification, dietary therapy or pharmacologic therapy may be warranted, if clinically indicated. 4. Diffuse bronchial wall thickening with moderate centrilobular and mild paraseptal emphysema; imaging findings suggestive of underlying COPD. 5. Mild cardiomegaly. Aortic Atherosclerosis (ICD10-I70.0) and Emphysema (ICD10-J43.9).  Electronically Signed   By: Vinnie Langton M.D.   On: 10/26/2018 16:11   Dg Chest Portable 1 View  Result Date: 10/31/2018 CLINICAL DATA:  78 year old male with lung cancer and COPD on home BiPAP. Shortness of breath. EXAM: PORTABLE CHEST 1 VIEW COMPARISON:  Chest CT 10/26/2018 and earlier. FINDINGS: Portable AP upright view at 1830 hours. Right suprahilar masslike opacity redemonstrated. Stable lung volumes since February. Increased bilateral hilar density since February, likely stable from the recent CT. Stable cardiac size. Visualized tracheal air column is within normal limits. No pneumothorax. Persistent small bilateral pleural effusions. Patchy and indistinct increased right basilar opacity since February, might be stable from the recent CT. Negative visible bowel gas pattern. IMPRESSION: 1. Similar to the recent chest CT 10/26/2018. Right suprahilar masslike opacity and Small bilateral pleural effusions. 2. Patchy and indistinct right lung base opacity, questionably increased from the recent CT, consider acute infectious exacerbation. Electronically Signed   By: Genevie Ann M.D.   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  # LUL cancer status post RT [2015]-surveillance April 2020 CT scan shows concern for progression left hilar region approximately 3.5 x 4.9 cm.  Would recommend a PET scan for further evaluation.  However this has to be done as outpatient in the context of his current admission/acute respiratory failure.  Given patient's significant oxygen demands/frail health-patient's options of treatment will be quite limited.   #Acute on chronic respiratory failure-COPD exacerbation/pneumonia-as per ICU team/primary team.  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 [Dr.Lateef]   #Overall prognosis is  poor given patient's chronic respiratory failure/and in the context of his possible recurrence of his lung cancer treatment options are limited.    Thank you Dr.Konidena for allowing me to participate in the care of your pleasant patient. Please do not hesitate to contact me with questions or concerns in the interim.   All questions were answered. The patient knows to call the clinic with any problems, questions or concerns.     Cammie Sickle, MD 11/02/2018 8:19 AM

## 2018-11-01 NOTE — Progress Notes (Signed)
PHARMACY CONSULT NOTE - FOLLOW UP  Pharmacy Consult for Electrolyte Monitoring and Replacement   Recent Labs: Potassium (mmol/L)  Date Value  11/01/2018 3.2 (L)  05/19/2014 4.4   Magnesium (mg/dL)  Date Value  11/01/2018 2.0  05/16/2014 1.9   Calcium (mg/dL)  Date Value  11/01/2018 9.0   Calcium, Total (mg/dL)  Date Value  05/19/2014 8.3 (L)   Albumin (g/dL)  Date Value  08/08/2018 3.6  05/16/2014 3.9   Phosphorus (mg/dL)  Date Value  11/01/2018 2.5   Sodium (mmol/L)  Date Value  11/01/2018 140  05/19/2014 139     Assessment: Patient admitted for CAP/COPD exacerbation. PMH significant for CHF, CKD III.  Goal of Therapy:  Potassium ~4.0, Magnesium ~2.0.  Plan:  Ordered KCl 40 mEq PO q6h x 2 doses. Will follow-up with AM labs.  Pharmacy will continue to monitor.  Paticia Stack, PharmD Pharmacy Resident  11/01/2018 2:50 PM

## 2018-11-01 NOTE — Progress Notes (Signed)
CRITICAL CARE NOTE      CHIEF COMPLAINT:   Acute hypoxemic respiratory failure  SUBJECTIVE FINDINGS & SIGNIFICANT EVENTS   Patient remains critically ill Prognosis is guarded   PAST MEDICAL HISTORY   Past Medical History:  Diagnosis Date  . Asthma   . CAD (coronary artery disease)   . COPD (chronic obstructive pulmonary disease) ([place])   . Depression   . Diabetes mellitus without complication ([place])   . Difficult intubation   . GERD (gastroesophageal reflux disease)   . Hypertension   . Lung cancer ([place])   . 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 Pancreatic mass   . PTSD (post-traumatic stress disorder)   . Sleep apnea    CPAP with O2 2L/m at night     SURGICAL HISTORY   Past Surgical History:  Procedure Laterality Date  . HERNIA REPAIR Right    right inguinal hernia repair  . KNEE SURGERY Right    right knee repair  . UPPER ESOPHAGEAL ENDOSCOPIC ULTRASOUND (EUS) N/A 09/20/2015   Procedure: UPPER ESOPHAGEAL ENDOSCOPIC ULTRASOUND (EUS);  Surgeon: [name], MD;  Location: [facility] ENDOSCOPY;  Service: Endoscopy;  Laterality: N/A;     FAMILY HISTORY   Family History  Problem Relation Age of Onset  . Hypertension Mother   . Congestive Heart Failure Father      SOCIAL HISTORY   Social History   Tobacco Use  . Smoking status: Former Research scientist (life sciences)  . Smokeless tobacco: Never Used  Substance Use Topics  . Alcohol use: Not on file  . Drug use: Not on file     MEDICATIONS   Current Medication:  Current Facility-Administered Medications:  .  0.9 %  sodium chloride infusion, 250 mL, Intravenous, PRN, Seals, Angela H, NP .  acetylcysteine (MUCOMYST) 20 % nebulizer / oral solution 4 mL, 4 mL, Nebulization, Q6H, Nazari, Walid A, RPH .  aspirin chewable tablet 81 mg, 81 mg,  Oral, Daily, Tukov-Yual, Magdalene S, NP, 81 mg at 11/01/18 1153 .  azithromycin (ZITHROMAX) 500 mg in sodium chloride 0.9 % 250 mL IVPB, 500 mg, Intravenous, Q24H, Seals, Angela H, NP .  budesonide (PULMICORT) nebulizer solution 0.25 mg, 0.25 mg, Nebulization, Q12H, Tukov-Yual, Magdalene S, NP, 0.25 mg at 11/01/18 0815 .  cefTRIAXone (ROCEPHIN) 1 g in sodium chloride 0.9 % 100 mL IVPB, 1 g, Intravenous, Q24H, Seals, Angela H, NP .  diltiazem (CARDIZEM CD) 24 hr capsule 180 mg, 180 mg, Oral, Daily, Tukov-Yual, Magdalene S, NP, 180 mg at 11/01/18 1154 .  enalapril (VASOTEC) tablet 5 mg, 5 mg, Oral, Daily, Tukov-Yual, Magdalene S, NP, 5 mg at 11/01/18 1154 .  enoxaparin (LOVENOX) injection 40 mg, 40 mg, Subcutaneous, Q24H, Seals, Angela H, NP, 40 mg at 10/31/18 2244 .  feeding supplement (ENSURE ENLIVE) (ENSURE ENLIVE) liquid 237 mL, 237 mL, Oral, BID BM, Tukov-Yual, Magdalene S, NP .  guaiFENesin-codeine 100-10 MG/5ML solution 10 mL, 10 mL, Oral, Q6H, Tukov-Yual, Magdalene S, NP, 10 mL at 11/01/18 1153 .  hydrOXYzine (ATARAX/VISTARIL) tablet 10 mg, 10 mg, Oral, QHS, Tukov-Yual, Magdalene S, NP .  insulin aspart (novoLOG) injection 0-15 Units, 0-15 Units, Subcutaneous, Q4H, Tukov-Yual, Magdalene S, NP, 5 Units at 11/01/18 1547 .  ipratropium-albuterol (DUONEB) 0.5-2.5 (3) MG/3ML nebulizer solution 3 mL, 3 mL, Nebulization, Q6H, Seals, Angela H, NP, 3 mL at 11/01/18 1446 .  methylPREDNISolone sodium succinate (SOLU-MEDROL) 40 mg/mL injection 40 mg, 40 mg, Intravenous, Q12H, Tukov-Yual, Magdalene S, NP, 40 mg at 11/01/18 1546 .  morphine 2 MG/ML injection 2 mg, 2 mg, Intravenous, Q4H PRN, Tukov-Yual, Magdalene S, NP, 2 mg at 11/01/18 0044 .  nitroGLYCERIN 50 mg in dextrose 5 % 250 mL (0.2 mg/mL) infusion, 0-200 mcg/min, Intravenous, Continuous, Nance Pear, MD, Stopped at 11/01/18 [phone] .  [START ON 11/02/2018] pantoprazole (PROTONIX) EC tablet 40 mg, 40 mg, Oral, Daily, Paticia Stack, [place] .   potassium chloride SA (K-DUR) CR tablet 40 mEq, 40 mEq, Oral, Q6H, Paticia Stack, RPH, 40 mEq at 11/01/18 1547 .  QUEtiapine (SEROQUEL) tablet 100 mg, 100 mg, Oral, QHS, Tukov-Yual, Magdalene S, NP, 100 mg at 11/01/18 0216 .  sodium chloride flush (NS) 0.9 % injection 3 mL, 3 mL, Intravenous, Q12H, Seals, Angela H, NP, 3 mL at 11/01/18 1156 .  sodium chloride flush (NS) 0.9 % injection 3 mL, 3 mL, Intravenous, PRN, Seals, Angela H, NP .  tamsulosin (FLOMAX) capsule 0.4 mg, 0.4 mg, Oral, QPC breakfast, Tukov-Yual, Magdalene S, NP, 0.4 mg at 11/01/18 0846 .  theophylline (THEO-24) 24 hr capsule 300 mg, 300 mg, Oral, BID, Tukov-Yual, Magdalene S, NP, 300 mg at 11/01/18 1154 .  traZODone (DESYREL) tablet 100 mg, 100 mg, Oral, QHS, Tukov-Yual, Magdalene S, NP, 100 mg at 11/01/18 0216    ALLERGIES   Ampicillin; Lorazepam; Penicillins; and Zyban [bupropion]    REVIEW OF SYSTEMS    10 point ROS conducted and is negative except as per subjective findings  PHYSICAL EXAMINATION   Vitals:   11/01/18 1500 11/01/18 1600  BP: 108/85 92/71  Pulse: 99 (!) 114  Resp: (!) 22 20  Temp:    SpO2: 100% (!) 86%    GENERAL: Mild distress, tripoding position for respiratory distress HEAD: Normocephalic, atraumatic.  EYES: Pupils equal, round, reactive to light.  No scleral icterus.  MOUTH: Moist mucosal membrane. NECK: Supple. No thyromegaly. No nodules. No JVD.  PULMONARY: Bilateral rhonchorous breath sounds CARDIOVASCULAR: S1 and S2. Regular rate and rhythm. No murmurs, rubs, or gallops.  GASTROINTESTINAL: Soft, nontender, non-distended. No masses. Positive bowel sounds. No hepatosplenomegaly.  MUSCULOSKELETAL: No swelling, clubbing, or edema.  NEUROLOGIC: Mild distress due to acute illness SKIN:intact,warm,dry   LABS AND IMAGING     -I personally reviewed most recent blood work, imaging and microbiology - significant findings today are hypokalemia, hypochloremia, AKI stage II, moderate  hyperglycemia, anemia, resolution of leukocytosis  LAB RESULTS: Recent Labs  Lab 10/31/18 1808 11/01/18 0531  NA 139 140  K 3.7 3.2*  CL 97* 96*  CO2 28 32  BUN 20 22  CREATININE 1.16 1.34*  GLUCOSE 196* 161*   Recent Labs  Lab 10/31/18 1808 11/01/18 0531  HGB 13.3 11.5*  HCT 44.2 37.4*  WBC 15.8* 7.1  PLT 312 221     IMAGING RESULTS: Dg Chest Portable 1 View  Result Date: 10/31/2018 CLINICAL DATA:  78 year old male with lung cancer and COPD on home BiPAP. Shortness of breath. EXAM: PORTABLE CHEST 1 VIEW COMPARISON:  Chest CT 10/26/2018 and earlier. FINDINGS: Portable AP upright view at 1830 hours. Right suprahilar masslike opacity redemonstrated. Stable lung volumes since February. Increased bilateral hilar density since February, likely stable from the recent CT. Stable cardiac size. Visualized tracheal air column is within normal limits. No pneumothorax. Persistent small bilateral pleural effusions. Patchy and indistinct increased right basilar opacity since February, might be stable from the recent CT. Negative visible bowel gas pattern. IMPRESSION: 1. Similar to the recent chest CT 10/26/2018. Right suprahilar masslike opacity and Small bilateral pleural effusions. 2.  Patchy and indistinct right lung base opacity, questionably increased from the recent CT, consider acute infectious exacerbation. Electronically Signed   By: Genevie Ann M.D.   On: 10/31/2018 19:07         ASSESSMENT AND PLAN    -Multidisciplinary rounds held today  Acute on Chronic Hypoxic Respiratory Failure         -likely due to post obstructive right lung pneumonia in context of lung cancer.      -Currently on regimen for community-acquired pneumonia with Zithromax and Rocephin    -Oncology on case - appreciate input    -mild-mod pleural effusion with mild LLL atelectasis contributing to respiratory compromise.   - will place on METAneb for BPH-Mucomyst 20% twice daily  -palliative care consultation      OSA     - will place on CPAP nocturnally    Accelerated hypertension   - resolved s/p cardizem drip   ID -continue IV abx as prescibed -follow up cultures  GI/Nutrition GI PROPHYLAXIS as indicated DIET-->TF's as tolerated Constipation protocol as indicated  ENDO - ICU hypoglycemic\Hyperglycemia protocol -check FSBS per protocol   ELECTROLYTES -follow labs as needed -replace as needed -pharmacy consultation   DVT/GI PRX ordered -SCDs  TRANSFUSIONS AS NEEDED MONITOR FSBS ASSESS the need for LABS as needed   Critical care provider statement:    Critical care time (minutes):  34   Critical care time was exclusive of:  Separately billable procedures and treating other patients   Critical care was necessary to treat or prevent imminent or life-threatening deterioration of the following conditions:   Acute hypoxemic respiratory failure, severe COPD, OSA, multiple comorbid conditions   Critical care was time spent personally by me on the following activities:  Development of treatment plan with patient or surrogate, discussions with consultants, evaluation of patient's response to treatment, examination of patient, obtaining history from patient or surrogate, ordering and performing treatments and interventions, ordering and review of laboratory studies and re-evaluation of patient's condition.  I assumed direction of critical care for this patient from another provider in my specialty: no    This document was prepared using Dragon voice recognition software and may include unintentional dictation errors.    Ottie Glazier, M.D.  Division of [place]

## 2018-11-01 NOTE — Progress Notes (Signed)
Please note, patient is followed by outpatient Palliative at home. CSW Shela Leff made aware. Thank you. Flo Shanks BS, RN, [facility] [phone]

## 2018-11-01 NOTE — ED Notes (Addendum)
Prior to this RN leaving floor, pt was left in care of two other RNs and provided with call bell and instructed to wait for assistance to arriveif needed, not to get up without help. While this rn transporting another pt to CCU at 2130, pt got up, removed iv lines, removed self from bipap and cardiac monitor and walked to restroom to void. Pt removed condom catheter prior to ambulating to restroom. On this Rn's arrival back to unit, pt's emergency toilet bell was ringing, pt was found sitting on bathroom floor by toilet, off monitoring equimpment and bipap/iv drips. Blood was on floor, on pt's arms and legs. Pt assisted back onto bipap then assisted up to sit on commode. Pt placed back on monitors, blood cleansed from pt. Iv catheters were intact on floor. Pt assisted back to bed. Iv restarted, pt placed back on ntg drip. Pt tolerating bipap well. Pt reminded to ring call bell and not to get up without help. Pt states "I did ring it, I waited a minute, no one came, so I got up." pt denies injury, pt states he was not able to walk back to bed after getting to commode so he sat on floor. Pt states he did not hit his head. Charge rn notified, dr. Archie Balboa notified and NP taking call for hospitalist notified as well. , time ammended at [place] on 11/01/2018

## 2018-11-02 ENCOUNTER — Telehealth: Payer: Self-pay | Admitting: Internal Medicine

## 2018-11-02 LAB — CBC WITH DIFFERENTIAL/PLATELET
Abs Immature Granulocytes: 0.05 10*3/uL (ref 0.00–0.07)
Basophils Absolute: 0 10*3/uL (ref 0.0–0.1)
Basophils Relative: 0 %
Eosinophils Absolute: 0 10*3/uL (ref 0.0–0.5)
Eosinophils Relative: 0 %
HCT: 41.1 % (ref 39.0–52.0)
Hemoglobin: 12.4 g/dL — ABNORMAL LOW (ref 13.0–17.0)
Immature Granulocytes: 1 %
Lymphocytes Relative: 4 %
Lymphs Abs: 0.4 10*3/uL — ABNORMAL LOW (ref 0.7–4.0)
MCH: 26.7 pg (ref 26.0–34.0)
MCHC: 30.2 g/dL (ref 30.0–36.0)
MCV: 88.4 fL (ref 80.0–100.0)
Monocytes Absolute: 0.5 10*3/uL (ref 0.1–1.0)
Monocytes Relative: 4 %
Neutro Abs: 10.1 10*3/uL — ABNORMAL HIGH (ref 1.7–7.7)
Neutrophils Relative %: 91 %
Platelets: 245 10*3/uL (ref 150–400)
RBC: 4.65 MIL/uL (ref 4.22–5.81)
RDW: 17.4 % — ABNORMAL HIGH (ref 11.5–15.5)
WBC: 11 10*3/uL — ABNORMAL HIGH (ref 4.0–10.5)
nRBC: 0 % (ref 0.0–0.2)

## 2018-11-02 LAB — BASIC METABOLIC PANEL
Anion gap: 11 (ref 5–15)
BUN: 27 mg/dL — ABNORMAL HIGH (ref 8–23)
CO2: 36 mmol/L — ABNORMAL HIGH (ref 22–32)
Calcium: 9.1 mg/dL (ref 8.9–10.3)
Chloride: 95 mmol/L — ABNORMAL LOW (ref 98–111)
Creatinine, Ser: 1.51 mg/dL — ABNORMAL HIGH (ref 0.61–1.24)
GFR calc Af Amer: 52 mL/min — ABNORMAL LOW (ref >60)
GFR calc non Af Amer: 45 mL/min — ABNORMAL LOW (ref >60)
Glucose, Bld: 160 mg/dL — ABNORMAL HIGH (ref 70–99)
Potassium: 3.3 mmol/L — ABNORMAL LOW (ref 3.5–5.1)
Sodium: 142 mmol/L (ref 135–145)

## 2018-11-02 LAB — GLUCOSE, CAPILLARY
Glucose-Capillary: 164 mg/dL — ABNORMAL HIGH (ref 70–99)
Glucose-Capillary: 192 mg/dL — ABNORMAL HIGH (ref 70–99)
Glucose-Capillary: 241 mg/dL — ABNORMAL HIGH (ref 70–99)
Glucose-Capillary: 281 mg/dL — ABNORMAL HIGH (ref 70–99)
Glucose-Capillary: 358 mg/dL — ABNORMAL HIGH (ref 70–99)
Glucose-Capillary: 89 mg/dL (ref 70–99)

## 2018-11-02 LAB — PROCALCITONIN: Procalcitonin: 0.19 ng/mL

## 2018-11-02 LAB — MAGNESIUM: Magnesium: 2.4 mg/dL (ref 1.7–2.4)

## 2018-11-02 MED ORDER — FUROSEMIDE 10 MG/ML IJ SOLN
40.0000 mg | Freq: Two times a day (BID) | INTRAMUSCULAR | Status: DC
  Administered 2018-11-02 – 2018-11-06 (×8): 40 mg via INTRAVENOUS
  Filled 2018-11-02 (×8): qty 4

## 2018-11-02 MED ORDER — POTASSIUM CHLORIDE CRYS ER 20 MEQ PO TBCR
40.0000 meq | EXTENDED_RELEASE_TABLET | Freq: Four times a day (QID) | ORAL | Status: AC
  Administered 2018-11-02 (×2): 40 meq via ORAL
  Filled 2018-11-02 (×2): qty 2

## 2018-11-02 MED ORDER — POTASSIUM CHLORIDE 10 MEQ/100ML IV SOLN
10.0000 meq | INTRAVENOUS | Status: AC
  Administered 2018-11-02 (×2): 10 meq via INTRAVENOUS
  Filled 2018-11-02 (×2): qty 100

## 2018-11-02 MED ORDER — PREDNISONE 20 MG PO TABS
20.0000 mg | ORAL_TABLET | Freq: Every day | ORAL | Status: DC
  Administered 2018-11-03 – 2018-11-09 (×5): 20 mg via ORAL
  Filled 2018-11-02 (×7): qty 2

## 2018-11-02 NOTE — Progress Notes (Signed)
Pt tolerating off bipap well at this time. Placed on 6lpm [place], sats 95%, respiratory rate 22/min. Will continue to monitor.

## 2018-11-02 NOTE — Progress Notes (Signed)
PHARMACY CONSULT NOTE - FOLLOW UP  Pharmacy Consult for Electrolyte Monitoring and Replacement   Recent Labs: Potassium (mmol/L)  Date Value  11/02/2018 3.3 (L)  05/19/2014 4.4   Magnesium (mg/dL)  Date Value  11/02/2018 2.4  05/16/2014 1.9   Calcium (mg/dL)  Date Value  11/02/2018 9.1   Calcium, Total (mg/dL)  Date Value  05/19/2014 8.3 (L)   Albumin (g/dL)  Date Value  08/08/2018 3.6  05/16/2014 3.9   Phosphorus (mg/dL)  Date Value  11/01/2018 2.5   Sodium (mmol/L)  Date Value  11/02/2018 142  05/19/2014 139     Assessment: Patient admitted for CAP/COPD exacerbation. PMH significant for CHF, CKD III.  Goal of Therapy:  Potassium ~4.0, Magnesium ~2.0.  Plan:  Patient is receiving furosemide 40 mg IV BID. Patient received KCl 10 mEq IV x 2 this AM. Ordered KCl 40 mEq PO q6h x 2 doses. Will follow-up with AM labs.  Pharmacy will continue to monitor.  Paticia Stack, PharmD Pharmacy Resident  11/02/2018 3:00 PM

## 2018-11-02 NOTE — Progress Notes (Signed)
CRITICAL CARE NOTE      CHIEF COMPLAINT:   Acute hypoxemic respiratory failure   SUBJECTIVE FINDINGS & SIGNIFICANT EVENTS   11/02/18 - Patient weaned off of BiPAP this am post overnight 2L diuresis. Prognosis is guarded, recurrent hospital admission with no options for treatment of lung cancer.    PAST MEDICAL HISTORY   Past Medical History:  Diagnosis Date  . Asthma   . CAD (coronary artery disease)   . COPD (chronic obstructive pulmonary disease) ([place])   . Depression   . Diabetes mellitus without complication ([place])   . Difficult intubation   . GERD (gastroesophageal reflux disease)   . Hypertension   . Lung cancer ([place])   . 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 Pancreatic mass   . PTSD (post-traumatic stress disorder)   . Sleep apnea    CPAP with O2 2L/m at night     SURGICAL HISTORY   Past Surgical History:  Procedure Laterality Date  . HERNIA REPAIR Right    right inguinal hernia repair  . KNEE SURGERY Right    right knee repair  . UPPER ESOPHAGEAL ENDOSCOPIC ULTRASOUND (EUS) N/A 09/20/2015   Procedure: UPPER ESOPHAGEAL ENDOSCOPIC ULTRASOUND (EUS);  Surgeon: [name], MD;  Location: [facility] ENDOSCOPY;  Service: Endoscopy;  Laterality: N/A;     FAMILY HISTORY   Family History  Problem Relation Age of Onset  . Hypertension Mother   . Congestive Heart Failure Father      SOCIAL HISTORY   Social History   Tobacco Use  . Smoking status: Former Research scientist (life sciences)  . Smokeless tobacco: Never Used  Substance Use Topics  . Alcohol use: Not on file  . Drug use: Not on file     MEDICATIONS   Current Medication:  Current Facility-Administered Medications:  .  0.9 %  sodium chloride infusion, 250 mL, Intravenous, PRN, Seals, Angela H, NP .  acetylcysteine (MUCOMYST) 20 %  nebulizer / oral solution 4 mL, 4 mL, Nebulization, Q6H, Nazari, Walid A, RPH, 4 mL at 11/02/18 0746 .  aspirin chewable tablet 81 mg, 81 mg, Oral, Daily, Tukov-Yual, Magdalene S, NP, 81 mg at 11/02/18 0823 .  azithromycin (ZITHROMAX) 500 mg in sodium chloride 0.9 % 250 mL IVPB, 500 mg, Intravenous, Q24H, Seals, Theo Dills, NP, Stopped at 11/01/18 2151 .  budesonide (PULMICORT) nebulizer solution 0.25 mg, 0.25 mg, Nebulization, Q12H, Tukov-Yual, Magdalene S, NP, 0.25 mg at 11/02/18 0746 .  cefTRIAXone (ROCEPHIN) 1 g in sodium chloride 0.9 % 100 mL IVPB, 1 g, Intravenous, Q24H, Seals, Theo Dills, NP, Stopped at 11/01/18 1853 .  diltiazem (CARDIZEM CD) 24 hr capsule 180 mg, 180 mg, Oral, Daily, Tukov-Yual, Magdalene S, NP, 180 mg at 11/02/18 0824 .  enalapril (VASOTEC) tablet 5 mg, 5 mg, Oral, Daily, Tukov-Yual, Magdalene S, NP, 5 mg at 11/02/18 0824 .  enoxaparin (LOVENOX) injection 40 mg, 40 mg, Subcutaneous, Q24H, Seals, Angela H, NP, 40 mg at 11/01/18 2205 .  feeding supplement (ENSURE ENLIVE) (ENSURE ENLIVE) liquid 237 mL, 237 mL, Oral, BID BM, Tukov-Yual, Magdalene S, NP .  furosemide (LASIX) injection 40 mg, 40 mg, Intravenous, Daily, Lanney Gins, Shaili Donalson, MD, 40 mg at 11/02/18 7989 .  guaiFENesin-codeine 100-10 MG/5ML solution 10 mL, 10 mL, Oral, Q6H, Tukov-Yual, Magdalene S, NP, 10 mL at 11/02/18 1034 .  hydrOXYzine (ATARAX/VISTARIL) tablet 10 mg, 10 mg, Oral, QHS, Tukov-Yual, Magdalene S, NP, 10 mg at 11/01/18 2201 .  insulin aspart (novoLOG) injection 0-15 Units, 0-15 Units,  Subcutaneous, Q4H, Tukov-Yual, Magdalene S, NP, 3 Units at 11/02/18 [phone] .  ipratropium-albuterol (DUONEB) 0.5-2.5 (3) MG/3ML nebulizer solution 3 mL, 3 mL, Nebulization, Q6H, Seals, Angela H, NP, 3 mL at 11/02/18 0746 .  methylPREDNISolone sodium succinate (SOLU-MEDROL) 40 mg/mL injection 40 mg, 40 mg, Intravenous, Q12H, Tukov-Yual, Magdalene S, NP, 40 mg at 11/02/18 0427 .  morphine 2 MG/ML injection 2 mg, 2 mg, Intravenous, Q4H  PRN, Tukov-Yual, Magdalene S, NP, 2 mg at 11/01/18 0044 .  nitroGLYCERIN 50 mg in dextrose 5 % 250 mL (0.2 mg/mL) infusion, 0-200 mcg/min, Intravenous, Continuous, Nance Pear, MD, Stopped at 11/01/18 1823 .  pantoprazole (PROTONIX) EC tablet 40 mg, 40 mg, Oral, Daily, Paticia Stack, RPH, 40 mg at 11/02/18 2947 .  potassium chloride SA (K-DUR) CR tablet 40 mEq, 40 mEq, Oral, Q6H, Paticia Stack, RPH .  QUEtiapine (SEROQUEL) tablet 100 mg, 100 mg, Oral, QHS, Tukov-Yual, Magdalene S, NP, 100 mg at 11/01/18 2158 .  sodium chloride flush (NS) 0.9 % injection 3 mL, 3 mL, Intravenous, Q12H, Seals, Angela H, NP, 3 mL at 11/02/18 0824 .  sodium chloride flush (NS) 0.9 % injection 3 mL, 3 mL, Intravenous, PRN, Seals, Angela H, NP .  tamsulosin (FLOMAX) capsule 0.4 mg, 0.4 mg, Oral, QPC breakfast, Tukov-Yual, Magdalene S, NP, 0.4 mg at 11/02/18 0823 .  theophylline (THEO-24) 24 hr capsule 300 mg, 300 mg, Oral, BID, Tukov-Yual, Magdalene S, NP, 300 mg at 11/02/18 0824 .  traZODone (DESYREL) tablet 100 mg, 100 mg, Oral, QHS, Tukov-Yual, Magdalene S, NP, 100 mg at 11/01/18 2200    ALLERGIES   Ampicillin; Lorazepam; Penicillins; and Zyban [bupropion]    REVIEW OF SYSTEMS     10 point ROS conducted and is negative except as per subjective findings  PHYSICAL EXAMINATION   Vitals:   11/02/18 0900 11/02/18 1000  BP:    Pulse: (!) 107 (!) 106  Resp: (!) 24 15  Temp:    SpO2: 96% 94%    GENERAL: Mild distress due to respiratory failure HEAD: Normocephalic, atraumatic.  EYES: Pupils equal, round, reactive to light.  No scleral icterus.  MOUTH: Moist mucosal membrane. NECK: Supple. No thyromegaly. No nodules. No JVD.  PULMONARY: Bilateral rhonchorous breath sounds without wheezing CARDIOVASCULAR: S1 and S2. Regular rate and rhythm. No murmurs, rubs, or gallops.  GASTROINTESTINAL: Soft, nontender, non-distended. No masses. Positive bowel sounds. No hepatosplenomegaly.   MUSCULOSKELETAL: No swelling, clubbing, or edema.  NEUROLOGIC: Mild distress due to acute illness SKIN:intact,warm,dry   LABS AND IMAGING     LAB RESULTS: Recent Labs  Lab 10/31/18 1808 11/01/18 0531 11/02/18 0332  NA 139 140 142  K 3.7 3.2* 3.3*  CL 97* 96* 95*  CO2 28 32 36*  BUN 20 22 27*  CREATININE 1.16 1.34* 1.51*  GLUCOSE 196* 161* 160*   Recent Labs  Lab 10/31/18 1808 11/01/18 0531 11/02/18 0332  HGB 13.3 11.5* 12.4*  HCT 44.2 37.4* 41.1  WBC 15.8* 7.1 11.0*  PLT 312 221 245     IMAGING RESULTS: No results found.    ASSESSMENT AND PLAN     -Multidisciplinary rounds held today  Acute on Chronic Hypoxic Respiratory Failure      -likely due to post obstructive right lung pneumonia in context of lung cancer.      -Currently on regimen for community-acquired pneumonia with Zithromax and Rocephin    -Oncology on case - appreciate input    -mild-mod pleural effusion with mild LLL atelectasis contributing  to respiratory compromise.     - will place on METAneb for BPH-Mucomyst 20% twice daily    -palliative care consultation  -increasing lasix to BID 40  - switch to oral prednisone     OSA     - will place on CPAP nocturnally    Accelerated hypertension   - resolved s/p cardizem drip   ID -continue IV abx as prescibed -follow up cultures  GI/Nutrition GI PROPHYLAXIS as indicated DIET-->TF's as tolerated Constipation protocol as indicated  ENDO - ICU hypoglycemic\Hyperglycemia protocol -check FSBS per protocol   ELECTROLYTES -follow labs as needed -replace as needed -pharmacy consultation   DVT/GI PRX ordered -SCDs  TRANSFUSIONS AS NEEDED MONITOR FSBS ASSESS the need for LABS as needed   Critical care provider statement:   Critical care time (minutes): 34  Critical care time was exclusive of: Separately billable procedures and treating other patients  Critical care was necessary to treat or prevent  imminent or life-threatening deterioration of the following conditions:  Acute hypoxemic respiratory failure, severe COPD, OSA, multiple comorbid conditions  Critical care was time spent personally by me on the following activities: Development of treatment plan with patient or surrogate, discussions with consultants, evaluation of patient's response to treatment, examination of patient, obtaining history from patient or surrogate, ordering and performing treatments and interventions, ordering and review of laboratory studies and re-evaluation of patient's condition.  I assumed direction of critical care for this patient from another provider in my specialty: no   This document was prepared using Dragon voice recognition software and may include unintentional dictation errors.  Ottie Glazier, M.D.  Division of [place]

## 2018-11-02 NOTE — Telephone Encounter (Signed)
Spoke to pt's daughter Ms.Berline Lopes- overall guarded prognosis; and the need to hold PET scan given acute illness. GB

## 2018-11-02 NOTE — Progress Notes (Signed)
Initial Nutrition Assessment  RD working remotely.  DOCUMENTATION CODES:   Not applicable  INTERVENTION:  Recommend liberalizing diet to regular.  Continue Ensure Enlive po BID, each supplement provides 350 kcal and 20 grams of protein.  NUTRITION DIAGNOSIS:   Increased nutrient needs related to catabolic illness(COPD, lung cancer) as evidenced by estimated needs.  GOAL:   Patient will meet greater than or equal to 90% of their needs  MONITOR:   PO intake, Supplement acceptance, Weight trends, Labs, I & O's  REASON FOR ASSESSMENT:   Rounds    ASSESSMENT:   78 year old male with PMHx of DM, COPD, CAD, GERD, PTSD, HTN, asthma, sleep apnea, depression, hx lung cancer s/p RT in 2015 now with concern for possible progression of left hilar region admitted with acute on chronic hypoxic respiratory failure likely due to post-obstructive right lung PNA in context of lung cancer.   Patient known to this RD from previous admission in 08/2018. During that admission patient had originally been made comfort care but then that was later reversed. Patient still able to eat fairly well at meals, he is just unable to keep up with how high his calorie/protein needs are in setting of catabolic nature of COPD and lung cancer. Patient's UBW was >200 lbs and he has lost weight over an unknown time frame. Unsure if current weight in chart is accurate as it appears to be reported. Patient met criteria for moderate chronic malnutrition in 08/2018. RD suspects patient would still meet criteria for malnutrition, but unable to determine without completing NFPE.  Medications reviewed and include: Lasix 40 mg daily IV, Novolog 0-15 units Q4hrs, Solu-Medrol 40 mg Q12hrs IV, pantoprazole, potassium chloride 40 mEq twice today, Seroquel QHS, Flomax, azithromycin, ceftriaxone.  Labs reviewed: CBG 164-241, Potassium 3.3, Chloride 95, CO2 36, BUN 27, Creatinine 1.51.  NUTRITION - FOCUSED PHYSICAL EXAM:  Unable to  complete at this time.  Diet Order:   Diet Order            Diet Heart Room service appropriate? Yes; Fluid consistency: Thin  Diet effective now             EDUCATION NEEDS:   No education needs have been identified at this time  Skin:  Skin Assessment: Reviewed RN Assessment  Last BM:  Unknown  Height:   Ht Readings from Last 1 Encounters:  10/31/18 '5\' 6"'$  (1.676 m)   Weight:   Wt Readings from Last 1 Encounters:  10/31/18 74.8 kg   Ideal Body Weight:     BMI:  Body mass index is 26.63 kg/m.  Estimated Nutritional Needs:   Kcal:  1700-2000  Protein:  85-100 grams  Fluid:  1.7-2 L/day  Willey Blade, MS, RD, LDN Office: [phone] Pager: [phone] After Hours/Weekend Pager: [phone]

## 2018-11-02 NOTE — Progress Notes (Signed)
Took over patient care around 1600.

## 2018-11-02 NOTE — Plan of Care (Signed)

## 2018-11-02 NOTE — Assessment & Plan Note (Addendum)
78 year old multiple medical problems including COPD chronic respiratory failure history of lung cancer currently admitted to hospital worsening shortness of breath.  # LUL cancer status post RT [2015]-surveillance April 2020 CT scan shows concern for progression left hilar region approximately 3.5 x 4.9 cm.  See discussion below.  #Acute on chronic respiratory failure-COPD exacerbation/pneumonia-as per ICU team/primary team.   Needing BiPAP most of the time. Currently stable.   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 [Dr.Lateef]   #Overall prognosis/plan of care-I had long discussion with the patient and family [daughter Haynes Dage over the phone]-given patient's frail/respiratory status-he is not a good candidate for any treatment even if the PET scan shows recurrent disease.  As a PET scan would not clinically change management-decided to hold on the PET scan.  I discussed regarding hospice, daughter not interested.  However she feels it would be reasonable to have palliative care following patient at home.  Agree with DNR/DNI.  Discussed with Dr.Aleskerov/appreciate recommendations.   # 25 minutes face-to-face with the patient discussing the above plan of care; more than 50% of time spent on prognosis/ natural history; counseling and coordination.

## 2018-11-02 NOTE — Progress Notes (Signed)
[facility] [place] NAME: Earl Clay    MR#:  [id number]  DATE OF BIRTH:  10-Dec-1940  SUBJECTIVE: Patient is seen at bedside, admitted for acute respiratory failure secondary to pneumonia, required BiPAP on admission.  Patient is off of BiPAP since morning.  CHIEF COMPLAINT:   Chief Complaint  Patient presents with  . Respiratory Distress    REVIEW OF SYSTEMS:   ROS CONSTITUTIONAL: No fever, fatigue or weakness.  EYES: No blurred or double vision.  EARS, NOSE, AND THROAT: No tinnitus or ear pain.  RESPIRATORY: Breathing is better than yesterday.  CARDIOVASCULAR: No chest pain, orthopnea, edema.  GASTROINTESTINAL: No nausea, vomiting, diarrhea or abdominal pain.  GENITOURINARY: No dysuria, hematuria.  ENDOCRINE: No polyuria, nocturia,  HEMATOLOGY: No anemia, easy bruising or bleeding SKIN: No rash or lesion. MUSCULOSKELETAL: No joint pain or arthritis.   NEUROLOGIC: No tingling, numbness, weakness.  PSYCHIATRY: No anxiety or depression.   DRUG ALLERGIES:   Allergies  Allergen Reactions  . Ampicillin Hives and Other (See Comments)    Has patient had a PCN reaction causing immediate rash, facial/tongue/throat swelling, SOB or lightheadedness with hypotension: No Has patient had a PCN reaction causing severe rash involving mucus membranes or skin necrosis: No Has patient had a PCN reaction that required hospitalization No Has patient had a PCN reaction occurring within the last 10 years: No If all of the above answers are "NO", then may proceed with Cephalosporin use.  . Lorazepam     Worsening confusion  . Penicillins Hives and Other (See Comments)    Has patient had a PCN reaction causing immediate rash, facial/tongue/throat swelling, SOB or lightheadedness with hypotension: No Has patient had a PCN reaction causing severe rash involving mucus membranes or skin necrosis: No Has patient had a PCN reaction that  required hospitalization No Has patient had a PCN reaction occurring within the last 10 years: No If all of the above answers are "NO", then may proceed with Cephalosporin use.  . Zyban [Bupropion] Rash    VITALS:  Blood pressure (!) 125/55, pulse (!) 106, temperature 98.1 F (36.7 C), temperature source Oral, resp. rate 15, height 5\' 6"  (1.676 m), weight 74.8 kg, SpO2 94 %.  PHYSICAL EXAMINATION:  GENERAL:  78 y.o.-year-old patient lying in the bed with no acute distress.  EYES: Pupils equal, round, reactive to light and accommodation. No scleral icterus. Extraocular muscles intact.  HEENT: Head atraumatic, normocephalic. Oropharynx and nasopharynx clear.  NECK:  Supple, no jugular venous distention. No thyroid enlargement, no tenderness.  LUNGS: Diminished air entry bilaterally.Marland Kitchen  CARDIOVASCULAR: S1, S2 normal. No murmurs, rubs, or gallops.  ABDOMEN: Soft, nontender, nondistended. Bowel sounds present. No organomegaly or mass.  EXTREMITIES: No pedal edema, cyanosis, or clubbing.  NEUROLOGIC: Cranial nerves II through XII are intact. Muscle strength 5/5 in all extremities. Sensation intact. Gait not checked.  PSYCHIATRIC: The patient is alert and oriented x 3.  SKIN: No obvious rash, lesion, or ulcer.    LABORATORY PANEL:   CBC Recent Labs  Lab 11/02/18 0332  WBC 11.0*  HGB 12.4*  HCT 41.1  PLT 245   ------------------------------------------------------------------------------------------------------------------  Chemistries  Recent Labs  Lab 11/02/18 0332  NA 142  K 3.3*  CL 95*  CO2 36*  GLUCOSE 160*  BUN 27*  CREATININE 1.51*  CALCIUM 9.1  MG 2.4   ------------------------------------------------------------------------------------------------------------------  Cardiac Enzymes Recent Labs  Lab 10/31/18 1808  TROPONINI 0.08*   ------------------------------------------------------------------------------------------------------------------  RADIOLOGY:   Dg Chest Portable 1 View  Result Date: 10/31/2018 CLINICAL DATA:  78 year old male with lung cancer and COPD on home BiPAP. Shortness of breath. EXAM: PORTABLE CHEST 1 VIEW COMPARISON:  Chest CT 10/26/2018 and earlier. FINDINGS: Portable AP upright view at 1830 hours. Right suprahilar masslike opacity redemonstrated. Stable lung volumes since February. Increased bilateral hilar density since February, likely stable from the recent CT. Stable cardiac size. Visualized tracheal air column is within normal limits. No pneumothorax. Persistent small bilateral pleural effusions. Patchy and indistinct increased right basilar opacity since February, might be stable from the recent CT. Negative visible bowel gas pattern. IMPRESSION: 1. Similar to the recent chest CT 10/26/2018. Right suprahilar masslike opacity and Small bilateral pleural effusions. 2. Patchy and indistinct right lung base opacity, questionably increased from the recent CT, consider acute infectious exacerbation. Electronically Signed   By: Genevie Ann M.D.   On: 10/31/2018 19:07    EKG:   Orders placed or performed during the hospital encounter of 10/31/18  . ED EKG  . ED EKG  . EKG 12-Lead  . EKG 12-Lead    ASSESSMENT AND PLAN:  Acute respiratory failure on chronic respiratory failure with hypoxia secondary to community-acquired pneumonia on top of his lung cancer, currently improving, patient is off the BiPAP now.  Continue, IV antibiotics with Rocephin, Zithromax, COVID-19 test has been negative, CT chest showed worsening of left hilar cancer, patient is all already scheduled for PET scan with oncologist Dr. Rogue Bussing.  Seen by Dr. Lynett Fish this morning, 2.  COPD exacerbation, continue oxygen, steroids.  Continue bronchodilators with Pulmicort, DuoNeb's. 3.  Chronic respiratory failure due to his lung cancer, COPD, patient is on theophylline, followed by Dr. Raul Del. 4.  hypokalemia: Replace potassium. 5.  Acute renal failure due to  dehydration, pneumonia, follow closely. Overall prognosis poor due to underlying lung cancer, advanced age.   All the records are reviewed and case discussed with Care Management/Social Workerr. Management plans discussed with the patient, family and they are in agreement.  CODE STATUS: DNR  TOTAL TIME TAKING CARE OF THIS PATIENT: 38 minutes.   POSSIBLE D/C IN 1-2 DAYS, DEPENDING ON CLINICAL CONDITION.   Epifanio Lesches M.D on 11/02/2018 at 10:41 AM  Between 7am to 6pm - Pager - [phone]  After 6pm go to [email or web address] - password [email or web address] [facility]  Office  [phone]  CC: Primary care physician; [name], MD   Note: This dictation was prepared with Dragon dictation along with smaller phrase technology. Any transcriptional errors that result from this process are unintentional.

## 2018-11-03 LAB — BASIC METABOLIC PANEL
Anion gap: 11 (ref 5–15)
BUN: 31 mg/dL — ABNORMAL HIGH (ref 8–23)
CO2: 37 mmol/L — ABNORMAL HIGH (ref 22–32)
Calcium: 9.1 mg/dL (ref 8.9–10.3)
Chloride: 94 mmol/L — ABNORMAL LOW (ref 98–111)
Creatinine, Ser: 1.45 mg/dL — ABNORMAL HIGH (ref 0.61–1.24)
GFR calc Af Amer: 54 mL/min — ABNORMAL LOW (ref >60)
GFR calc non Af Amer: 47 mL/min — ABNORMAL LOW (ref >60)
Glucose, Bld: 158 mg/dL — ABNORMAL HIGH (ref 70–99)
Potassium: 3.9 mmol/L (ref 3.5–5.1)
Sodium: 142 mmol/L (ref 135–145)

## 2018-11-03 LAB — CBC
HCT: 42.1 % (ref 39.0–52.0)
Hemoglobin: 12.9 g/dL — ABNORMAL LOW (ref 13.0–17.0)
MCH: 27 pg (ref 26.0–34.0)
MCHC: 30.6 g/dL (ref 30.0–36.0)
MCV: 88.1 fL (ref 80.0–100.0)
Platelets: 285 10*3/uL (ref 150–400)
RBC: 4.78 MIL/uL (ref 4.22–5.81)
RDW: 17.2 % — ABNORMAL HIGH (ref 11.5–15.5)
WBC: 14.8 10*3/uL — ABNORMAL HIGH (ref 4.0–10.5)
nRBC: 0 % (ref 0.0–0.2)

## 2018-11-03 LAB — GLUCOSE, CAPILLARY
Glucose-Capillary: 156 mg/dL — ABNORMAL HIGH (ref 70–99)
Glucose-Capillary: 158 mg/dL — ABNORMAL HIGH (ref 70–99)
Glucose-Capillary: 355 mg/dL — ABNORMAL HIGH (ref 70–99)
Glucose-Capillary: 293 mg/dL — ABNORMAL HIGH (ref 70–99)
Glucose-Capillary: 169 mg/dL — ABNORMAL HIGH (ref 70–99)

## 2018-11-03 MED ORDER — IPRATROPIUM-ALBUTEROL 0.5-2.5 (3) MG/3ML IN SOLN
3.0000 mL | RESPIRATORY_TRACT | Status: DC
  Administered 2018-11-03 – 2018-11-06 (×16): 3 mL via RESPIRATORY_TRACT
  Filled 2018-11-03 (×15): qty 3

## 2018-11-03 MED ORDER — INSULIN DETEMIR 100 UNIT/ML ~~LOC~~ SOLN
7.0000 [IU] | Freq: Every day | SUBCUTANEOUS | Status: DC
  Administered 2018-11-03 – 2018-11-09 (×6): 7 [IU] via SUBCUTANEOUS
  Filled 2018-11-03 (×7): qty 0.07

## 2018-11-03 MED ORDER — ENOXAPARIN SODIUM 40 MG/0.4ML ~~LOC~~ SOLN
40.0000 mg | SUBCUTANEOUS | Status: DC
  Administered 2018-11-03 – 2018-11-08 (×6): 40 mg via SUBCUTANEOUS
  Filled 2018-11-03 (×6): qty 0.4

## 2018-11-03 MED ORDER — AZITHROMYCIN 500 MG PO TABS
500.0000 mg | ORAL_TABLET | Freq: Every day | ORAL | Status: AC
  Administered 2018-11-03 – 2018-11-04 (×2): 500 mg via ORAL
  Filled 2018-11-03 (×2): qty 1

## 2018-11-03 MED ORDER — SODIUM CHLORIDE 0.9 % IV SOLN
1.0000 g | INTRAVENOUS | Status: AC
  Administered 2018-11-03 – 2018-11-07 (×5): 1 g via INTRAVENOUS
  Filled 2018-11-03 (×5): qty 1

## 2018-11-03 MED ORDER — ACETYLCYSTEINE 20 % IN SOLN
4.0000 mL | Freq: Two times a day (BID) | RESPIRATORY_TRACT | Status: DC
  Administered 2018-11-03 – 2018-11-07 (×7): 4 mL via RESPIRATORY_TRACT
  Filled 2018-11-03 (×8): qty 4

## 2018-11-03 NOTE — Progress Notes (Signed)
Inpatient Diabetes Program Recommendations  AACE/ADA: New Consensus Statement on Inpatient Glycemic Control  Target Ranges:  Prepandial:   less than 140 mg/dL      Peak postprandial:   less than 180 mg/dL (1-2 hours)      Critically ill patients:  140 - 180 mg/dL   Results for [name], [name] (MRN [id number]) as of 11/03/2018 09:54  Ref. Range 11/02/2018 07:29 11/02/2018 11:14 11/02/2018 16:32 11/02/2018 19:26 11/02/2018 23:48 11/03/2018 04:09 11/03/2018 07:37  Glucose-Capillary Latest Ref Range: 70 - 99 mg/dL 192 (H)  Novolog 3 units 241 (H)  Novolog 5 units 281 (H)  Novolog 8 units 358 (H)  Novolog 15 units 89 156 (H)  Novolog 3 units 158 (H)  Novolog 3 units   Review of Glycemic Control  Diabetes history: DM2 Outpatient Diabetes medications: Levemir 20 units daily, Novolog 10-20 units TID with meals Current orders for Inpatient glycemic control: Novolog 0-15 units Q4H; Prednisone 20 mg daily  Inpatient Diabetes Program Recommendations:   Insulin - Basal: Please consider ordering Levemir 7 units daily (based on 74.8 kg x 0.1 units).  Thanks, Barnie Alderman, RN, MSN, CDE Diabetes Coordinator Inpatient Diabetes Program [phone] (Team Pager from 8am to 5pm)

## 2018-11-03 NOTE — Progress Notes (Signed)
CRITICAL CARE NOTE        SUBJECTIVE FINDINGS & SIGNIFICANT EVENTS    Prognosis is guarded Refusing BIPAP today , will optimize for downgrade to medical floor  PAST MEDICAL HISTORY   Past Medical History:  Diagnosis Date  . Asthma   . CAD (coronary artery disease)   . COPD (chronic obstructive pulmonary disease) ([place])   . Depression   . Diabetes mellitus without complication ([place])   . Difficult intubation   . GERD (gastroesophageal reflux disease)   . Hypertension   . Lung cancer ([place])   . 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 Pancreatic mass   . PTSD (post-traumatic stress disorder)   . Sleep apnea    CPAP with O2 2L/m at night     SURGICAL HISTORY   Past Surgical History:  Procedure Laterality Date  . HERNIA REPAIR Right    right inguinal hernia repair  . KNEE SURGERY Right    right knee repair  . UPPER ESOPHAGEAL ENDOSCOPIC ULTRASOUND (EUS) N/A 09/20/2015   Procedure: UPPER ESOPHAGEAL ENDOSCOPIC ULTRASOUND (EUS);  Surgeon: [name], MD;  Location[facility] ENDOSCOPY;  Service: Endoscopy;  Laterality: N/A;     FAMILY HISTORY   Family History  Problem Relation Age of Onset  . Hypertension Mother   . Congestive Heart Failure Father      SOCIAL HISTORY   Social History   Tobacco Use  . Smoking status: Former Research scientist (life sciences)  . Smokeless tobacco: Never Used  Substance Use Topics  . Alcohol use: Not on file  . Drug use: Not on file     MEDICATIONS   Current Medication:  Current Facility-Administered Medications:  .  0.9 %  sodium chloride infusion, 250 mL, Intravenous, PRN, Seals, Angela H, NP .  acetylcysteine (MUCOMYST) 20 % nebulizer / oral solution 4 mL, 4 mL, Nebulization, Q6H, Nazari, Walid A, RPH, 4 mL at 11/03/18 0736 .  aspirin chewable tablet 81 mg, 81  mg, Oral, Daily, Tukov-Yual, Magdalene S, NP, 81 mg at 11/02/18 0823 .  azithromycin (ZITHROMAX) 500 mg in sodium chloride 0.9 % 250 mL IVPB, 500 mg, Intravenous, Q24H, Seals, Angela H, NP, Last Rate: 250 mL/hr at 11/02/18 2005, 500 mg at 11/02/18 2005 .  budesonide (PULMICORT) nebulizer solution 0.25 mg, 0.25 mg, Nebulization, Q12H, Tukov-Yual, Magdalene S, NP, 0.25 mg at 11/03/18 0735 .  cefTRIAXone (ROCEPHIN) 1 g in sodium chloride 0.9 % 100 mL IVPB, 1 g, Intravenous, Q24H, Seals, Angela H, NP, Last Rate: 200 mL/hr at 11/02/18 1735, 1 g at 11/02/18 1735 .  diltiazem (CARDIZEM CD) 24 hr capsule 180 mg, 180 mg, Oral, Daily, Tukov-Yual, Magdalene S, NP, 180 mg at 11/02/18 0824 .  enoxaparin (LOVENOX) injection 40 mg, 40 mg, Subcutaneous, Q24H, Seals, Angela H, NP, 40 mg at 11/02/18 2141 .  feeding supplement (ENSURE ENLIVE) (ENSURE ENLIVE) liquid 237 mL, 237 mL, Oral, BID BM, Tukov-Yual, Magdalene S, NP .  furosemide (LASIX) injection 40 mg, 40 mg, Intravenous, BID, Lanney Gins, Sadik Piascik, MD, 40 mg at 11/03/18 0744 .  guaiFENesin-codeine 100-10 MG/5ML solution 10 mL, 10 mL, Oral, Q6H, Tukov-Yual, Magdalene S, NP, 10 mL at 11/03/18 0423 .  hydrOXYzine (ATARAX/VISTARIL) tablet 10 mg, 10 mg, Oral, QHS, Tukov-Yual, Magdalene S, NP, 10 mg at 11/02/18 2140 .  insulin aspart (novoLOG) injection 0-15 Units, 0-15 Units, Subcutaneous, Q4H, Tukov-Yual, Magdalene S, NP, 3 Units at 11/03/18 0745 .  ipratropium-albuterol (DUONEB) 0.5-2.5 (3) MG/3ML nebulizer solution 3 mL, 3 mL, Nebulization, Q6H,  Seals, Theo Dills, NP, 3 mL at 11/03/18 0735 .  morphine 2 MG/ML injection 2 mg, 2 mg, Intravenous, Q4H PRN, Tukov-Yual, Magdalene S, NP, 2 mg at 11/01/18 0044 .  nitroGLYCERIN 50 mg in dextrose 5 % 250 mL (0.2 mg/mL) infusion, 0-200 mcg/min, Intravenous, Continuous, Nance Pear, MD, Stopped at 11/01/18 1823 .  pantoprazole (PROTONIX) EC tablet 40 mg, 40 mg, Oral, Daily, Paticia Stack, RPH, 40 mg at 11/02/18 6433 .   predniSONE (DELTASONE) tablet 20 mg, 20 mg, Oral, Q breakfast, Ashten Prats, MD, 20 mg at 11/03/18 0745 .  QUEtiapine (SEROQUEL) tablet 100 mg, 100 mg, Oral, QHS, Tukov-Yual, Magdalene S, NP, 100 mg at 11/02/18 2138 .  sodium chloride flush (NS) 0.9 % injection 3 mL, 3 mL, Intravenous, Q12H, Seals, Angela H, NP, 3 mL at 11/02/18 2138 .  sodium chloride flush (NS) 0.9 % injection 3 mL, 3 mL, Intravenous, PRN, Seals, Angela H, NP .  tamsulosin (FLOMAX) capsule 0.4 mg, 0.4 mg, Oral, QPC breakfast, Tukov-Yual, Magdalene S, NP, 0.4 mg at 11/02/18 0823 .  theophylline (THEO-24) 24 hr capsule 300 mg, 300 mg, Oral, BID, Tukov-Yual, Magdalene S, NP, 300 mg at 11/02/18 2140 .  traZODone (DESYREL) tablet 100 mg, 100 mg, Oral, QHS, Tukov-Yual, Magdalene S, NP, 100 mg at 11/02/18 2139    ALLERGIES   Ampicillin; Lorazepam; Penicillins; and Zyban [bupropion]    REVIEW OF SYSTEMS     ROS 10 point conducted and is negative except as per subjective findings  PHYSICAL EXAMINATION   Vitals:   11/03/18 0700 11/03/18 0800  BP: (!) 78/41 106/90  Pulse: 86 (!) 101  Resp: 19 (!) 23  Temp:  97.8 F (36.6 C)  SpO2: (!) 89% 95%    GENERAL: Mild distress due to dyspnea and shortness of breath HEAD: Normocephalic, atraumatic.  EYES: Pupils equal, round, reactive to light.  No scleral icterus.  MOUTH: Moist mucosal membrane. NECK: Supple. No thyromegaly. No nodules. No JVD.  PULMONARY: Bilateral rhonchorous breath sounds CARDIOVASCULAR: S1 and S2. Regular rate and rhythm. No murmurs, rubs, or gallops.  GASTROINTESTINAL: Soft, nontender, non-distended. No masses. Positive bowel sounds. No hepatosplenomegaly.  MUSCULOSKELETAL: No swelling, clubbing, or edema.  NEUROLOGIC: Mild distress due to acute illness SKIN:intact,warm,dry   LABS AND IMAGING       LAB RESULTS: Recent Labs  Lab 11/01/18 0531 11/02/18 0332 11/03/18 0410  NA 140 142 142  K 3.2* 3.3* 3.9  CL 96* 95* 94*  CO2 32 36* 37*   BUN 22 27* 31*  CREATININE 1.34* 1.51* 1.45*  GLUCOSE 161* 160* 158*   Recent Labs  Lab 11/01/18 0531 11/02/18 0332 11/03/18 0410  HGB 11.5* 12.4* 12.9*  HCT 37.4* 41.1 42.1  WBC 7.1 11.0* 14.8*  PLT 221 245 285     IMAGING RESULTS: No results found.    ASSESSMENT AND PLAN     -Multidisciplinary rounds held today  Acuteon ChronicHypoxic Respiratory Failure -likely due to post obstructive right lung pneumonia in context of lung cancer.  -Currently on regimen for community-acquired pneumonia with Zithromax and Rocephin -Oncology on case - appreciate input -mild-mod pleural effusion with mild LLL atelectasis contributing to respiratory compromise.    - will place on METAneb for BPH-Mucomyst 20% twice daily   -palliative care consultation  -increasing lasix to BID 40  -Continue with  prednisone     OSA  - will place on CPAP nocturnally    Accelerated hypertension - resolved s/p cardizem drip   ID -continue  IV abx as prescibed -follow up cultures  GI/Nutrition GI PROPHYLAXIS as indicated DIET-->TF's as tolerated Constipation protocol as indicated  ENDO - ICU hypoglycemic\Hyperglycemia protocol -check FSBS per protocol   ELECTROLYTES -follow labs as needed -replace as needed -pharmacy consultation   DVT/GI PRX ordered -SCDs  TRANSFUSIONS AS NEEDED MONITOR FSBS ASSESS the need for LABS as needed   Critical care provider statement:  Critical care time (minutes):35 Critical care time was exclusive of: Separately billable procedures and treating other patients Critical care was necessary to treat or prevent imminent or life-threatening deterioration of the following conditions:Acute hypoxemic respiratory failure, severe COPD, OSA, multiple comorbid conditions Critical care was time spent personally by me on the following activities: Development of treatment plan with patient or surrogate,  discussions with consultants, evaluation of patient's response to treatment, examination of patient, obtaining history from patient or surrogate, ordering and performing treatments and interventions, ordering and review of laboratory studies and re-evaluation of patient's condition. I assumed direction of critical care for this patient from another provider in my specialty: no    This document was prepared using Dragon voice recognition software and may include unintentional dictation errors.    Ottie Glazier, M.D.  Division of [place]

## 2018-11-03 NOTE — Progress Notes (Signed)
[place] [facility] [place] NAME: Earl Clay    MR#:  [id number]  DATE OF BIRTH:  Apr 07, 1941    CHIEF COMPLAINT:   Chief Complaint  Patient presents with  . Respiratory Distress   Continues to have shortness of breath and cough.  On high flow nasal cannula.  Wears 4 to 5 L oxygen at home.   REVIEW OF SYSTEMS:   ROS CONSTITUTIONAL: Fatigue EYES: No blurred or double vision.  EARS, NOSE, AND THROAT: No tinnitus or ear pain.  RESPIRATORY: Shortness of breath is present CARDIOVASCULAR: No chest pain, orthopnea, edema.  GASTROINTESTINAL: No nausea, vomiting, diarrhea or abdominal pain.  GENITOURINARY: No dysuria, hematuria.  ENDOCRINE: No polyuria, nocturia,  HEMATOLOGY: No anemia, easy bruising or bleeding SKIN: No rash or lesion. MUSCULOSKELETAL: No joint pain or arthritis.   NEUROLOGIC: No tingling, numbness, weakness.  PSYCHIATRY: No anxiety or depression.   DRUG ALLERGIES:   Allergies  Allergen Reactions  . Ampicillin Hives and Other (See Comments)    Has patient had a PCN reaction causing immediate rash, facial/tongue/throat swelling, SOB or lightheadedness with hypotension: No Has patient had a PCN reaction causing severe rash involving mucus membranes or skin necrosis: No Has patient had a PCN reaction that required hospitalization No Has patient had a PCN reaction occurring within the last 10 years: No If all of the above answers are "NO", then may proceed with Cephalosporin use.  . Lorazepam     Worsening confusion  . Penicillins Hives and Other (See Comments)    Has patient had a PCN reaction causing immediate rash, facial/tongue/throat swelling, SOB or lightheadedness with hypotension: No Has patient had a PCN reaction causing severe rash involving mucus membranes or skin necrosis: No Has patient had a PCN reaction that required hospitalization No Has patient had a PCN reaction occurring within the last 10  years: No If all of the above answers are "NO", then may proceed with Cephalosporin use.  . Zyban [Bupropion] Rash    VITALS:  Blood pressure (!) 119/57, pulse (!) 110, temperature 97.8 F (36.6 C), temperature source Axillary, resp. rate (!) 22, height 5\' 6"  (1.676 m), weight 74.8 kg, SpO2 94 %.  PHYSICAL EXAMINATION:  GENERAL:  78 y.o.-year-old patient lying in the bed with conversational dyspnea EYES: Pupils equal, round, reactive to light and accommodation. No scleral icterus. Extraocular muscles intact.  HEENT: Head atraumatic, normocephalic. Oropharynx and nasopharynx clear.  NECK:  Supple, no jugular venous distention. No thyroid enlargement, no tenderness.  LUNGS: Diminished air entry bilaterally.Marland Kitchen   CARDIOVASCULAR: S1, S2 normal. No murmurs, rubs, or gallops.  ABDOMEN: Soft, nontender, nondistended. Bowel sounds present. No organomegaly or mass.  EXTREMITIES: No pedal edema, cyanosis, or clubbing.  NEUROLOGIC: Cranial nerves II through XII are intact. Muscle strength 5/5 in all extremities. Sensation intact. Gait not checked.  PSYCHIATRIC: The patient is alert and oriented x 3.  SKIN: No obvious rash, lesion, or ulcer.    LABORATORY PANEL:   CBC Recent Labs  Lab 11/03/18 0410  WBC 14.8*  HGB 12.9*  HCT 42.1  PLT 285   ------------------------------------------------------------------------------------------------------------------  Chemistries  Recent Labs  Lab 11/02/18 0332 11/03/18 0410  NA 142 142  K 3.3* 3.9  CL 95* 94*  CO2 36* 37*  GLUCOSE 160* 158*  BUN 27* 31*  CREATININE 1.51* 1.45*  CALCIUM 9.1 9.1  MG 2.4  --    ------------------------------------------------------------------------------------------------------------------  Cardiac Enzymes Recent Labs  Lab 10/31/18 1808  TROPONINI 0.08*   ------------------------------------------------------------------------------------------------------------------  RADIOLOGY:  No results  found.  EKG:   Orders placed or performed during the hospital encounter of 10/31/18  . ED EKG  . ED EKG  . EKG 12-Lead  . EKG 12-Lead    ASSESSMENT AND PLAN:  1. Acute respiratory failure on chronic respiratory failure with hypoxia secondary to community-acquired pneumonia. He does have poor baseline pulmonary function secondary to his COPD and lung cancer.  On HFNC  today  continue, IV antibiotics with Rocephin, Zithromax COVID-19 test has been negative, CT chest showed worsening of left hilar cancer, patient is all already scheduled for PET scan with oncologist Dr. Rogue Bussing.    2.  COPD exacerbation, continue oxygen, steroids.  Continue bronchodilators with Pulmicort, DuoNeb's.  4. Hypokalemia: Replaced  5.  Acute renal failure due to dehydration, pneumonia, follow closely.  Overall prognosis poor due to underlying lung cancer, advanced age.   All the records are reviewed and case discussed with Care Management/Social Workerr. Management plans discussed with the patient, family and they are in agreement.  CODE STATUS: DNR  TOTAL TIME TAKING CARE OF THIS PATIENT: 35 minutes.   POSSIBLE D/C IN 1-2 DAYS, DEPENDING ON CLINICAL CONDITION.  Earl Clay M.D on 11/03/2018 at 11:06 AM  Between 7am to 6pm - Pager - [phone]  After 6pm go to [email or web address] - password [email or web address] [facility]  Office  [phone]  CC: Primary care physician; Earl Crouch, MD   Note: This dictation was prepared with Dragon dictation along with smaller phrase technology. Any transcriptional errors that result from this process are unintentional.

## 2018-11-03 NOTE — Progress Notes (Signed)
PHARMACY CONSULT NOTE - FOLLOW UP  Pharmacy Consult for Electrolyte Monitoring and Replacement   Recent Labs: Potassium (mmol/L)  Date Value  11/03/2018 3.9  05/19/2014 4.4   Magnesium (mg/dL)  Date Value  11/02/2018 2.4  05/16/2014 1.9   Calcium (mg/dL)  Date Value  11/03/2018 9.1   Calcium, Total (mg/dL)  Date Value  05/19/2014 8.3 (L)   Albumin (g/dL)  Date Value  08/08/2018 3.6  05/16/2014 3.9   Phosphorus (mg/dL)  Date Value  11/01/2018 2.5   Sodium (mmol/L)  Date Value  11/03/2018 142  05/19/2014 139     Assessment: Patient admitted for CAP/COPD exacerbation. PMH significant for CHF, CKD III.  Goal of Therapy:  Potassium ~4.0, Magnesium ~2.0.  Plan:  Patient is receiving furosemide 40 mg IV BID.  Potassium 71mEq PO x 1.   Pharmacy will continue to monitor and adjust per consult.   Tawan Corkern L 11/03/2018 4:39 PM

## 2018-11-03 NOTE — Progress Notes (Signed)
Palliative:   Earl Clay is sitting up in bed.  He greets me making and keeping eye contact.  Although he appears chronically ill and frail, he is in no acute respiratory distress at this time.  He tells me that he has no questions or concerns at this time.  No family present at bedside at this time due to visitor restrictions.  He is followed by outpatient palliative services at home.  We talked about Earl Clay acute and chronic health concerns including respiratory failure and lung cancer.  We talked about the use of BiPAP at home.  Earl Clay tells me that he has a BiPAP, but has not used it.  We talked about rehospitalization.  At this point, Earl Clay tells me that he would not want to be rehospitalized as needed.  We talked about his pneumonia, and the likelihood that this is related to his cancer, postobstructive pneumonia.  I share that we will continue to care for him, treating the treatable as he desires.  Earl Clay tells me that his goal is to return home, not SNF rehab.  I ask you would help care for him at home, asking if his girlfriend Earl Clay lives with him.  He shares that he has been asking her to move in with him for about 30 years.  I shared that now would be an opportune time for him to have help at home, in particular when he has respiratory distress and the use of BiPAP may help him.  We talked about preferred place of death.  Earl Clay states that he has never considered his preference.  We talked about being able to pass at home versus hospital versus residential hospice.  I encourage Earl Clay to consider his choices, in order that we may respect him.  25 minutes [name], NP  Palliative Medicine Team  Team Phone # [phone]  Greater than 50% of this time was spent counseling and coordinating care related to the above assessment and plan.

## 2018-11-03 NOTE — Progress Notes (Signed)
Earlier this shift patient c/o and appeared with severe shortness of breath, and was tripoding.  Morphine given without a positive effect.  Patient agreeable to placing Bi-Pap back on.  Patient had the Bi-Pap on for approximately 2 1/2 hours before removing it.  Patient states he feels better at this time.  Transfer out canceled and patient transferred back to step-down status.

## 2018-11-04 ENCOUNTER — Telehealth: Payer: Self-pay | Admitting: Internal Medicine

## 2018-11-04 DIAGNOSIS — Z7189 Other specified counseling: Secondary | ICD-10-CM

## 2018-11-04 LAB — GLUCOSE, CAPILLARY
Glucose-Capillary: 106 mg/dL — ABNORMAL HIGH (ref 70–99)
Glucose-Capillary: 131 mg/dL — ABNORMAL HIGH (ref 70–99)
Glucose-Capillary: 135 mg/dL — ABNORMAL HIGH (ref 70–99)
Glucose-Capillary: 154 mg/dL — ABNORMAL HIGH (ref 70–99)
Glucose-Capillary: 182 mg/dL — ABNORMAL HIGH (ref 70–99)
Glucose-Capillary: 287 mg/dL — ABNORMAL HIGH (ref 70–99)
Glucose-Capillary: 328 mg/dL — ABNORMAL HIGH (ref 70–99)

## 2018-11-04 LAB — BASIC METABOLIC PANEL
Anion gap: 10 (ref 5–15)
BUN: 32 mg/dL — ABNORMAL HIGH (ref 8–23)
CO2: 40 mmol/L — ABNORMAL HIGH (ref 22–32)
Calcium: 9.2 mg/dL (ref 8.9–10.3)
Chloride: 89 mmol/L — ABNORMAL LOW (ref 98–111)
Creatinine, Ser: 1.53 mg/dL — ABNORMAL HIGH (ref 0.61–1.24)
GFR calc Af Amer: 51 mL/min — ABNORMAL LOW (ref >60)
GFR calc non Af Amer: 44 mL/min — ABNORMAL LOW (ref >60)
Glucose, Bld: 163 mg/dL — ABNORMAL HIGH (ref 70–99)
Potassium: 3.5 mmol/L (ref 3.5–5.1)
Sodium: 139 mmol/L (ref 135–145)

## 2018-11-04 LAB — MAGNESIUM: Magnesium: 2.3 mg/dL (ref 1.7–2.4)

## 2018-11-04 LAB — CBC
HCT: 42.3 % (ref 39.0–52.0)
Hemoglobin: 12.9 g/dL — ABNORMAL LOW (ref 13.0–17.0)
MCH: 26.6 pg (ref 26.0–34.0)
MCHC: 30.5 g/dL (ref 30.0–36.0)
MCV: 87.2 fL (ref 80.0–100.0)
Platelets: 277 10*3/uL (ref 150–400)
RBC: 4.85 MIL/uL (ref 4.22–5.81)
RDW: 17.3 % — ABNORMAL HIGH (ref 11.5–15.5)
WBC: 12.8 10*3/uL — ABNORMAL HIGH (ref 4.0–10.5)
nRBC: 0 % (ref 0.0–0.2)

## 2018-11-04 LAB — PHOSPHORUS: Phosphorus: 2.8 mg/dL (ref 2.5–4.6)

## 2018-11-04 LAB — PROCALCITONIN: Procalcitonin: 0.11 ng/mL

## 2018-11-04 MED ORDER — BISACODYL 10 MG RE SUPP: 10.0000 mg | Freq: Every day | RECTAL | Status: DC | PRN

## 2018-11-04 MED ORDER — GLYCOPYRROLATE 1 MG PO TABS
1.0000 mg | ORAL_TABLET | Freq: Two times a day (BID) | ORAL | Status: DC
  Administered 2018-11-04 – 2018-11-06 (×2): 1 mg via ORAL
  Filled 2018-11-04 (×2): qty 1

## 2018-11-04 MED ORDER — POTASSIUM CHLORIDE CRYS ER 20 MEQ PO TBCR
40.0000 meq | EXTENDED_RELEASE_TABLET | Freq: Once | ORAL | Status: AC
  Administered 2018-11-04: 40 meq via ORAL
  Filled 2018-11-04: qty 2

## 2018-11-04 MED ORDER — MORPHINE SULFATE (PF) 2 MG/ML IV SOLN
2.0000 mg | Freq: Once | INTRAVENOUS | Status: AC
  Administered 2018-11-04: 2 mg via INTRAVENOUS
  Filled 2018-11-04: qty 1

## 2018-11-04 MED ORDER — SENNOSIDES-DOCUSATE SODIUM 8.6-50 MG PO TABS
2.0000 | ORAL_TABLET | Freq: Two times a day (BID) | ORAL | Status: DC
  Administered 2018-11-04 – 2018-11-09 (×8): 2 via ORAL
  Filled 2018-11-04 (×10): qty 2

## 2018-11-04 MED ORDER — MORPHINE SULFATE (CONCENTRATE) 10 MG/0.5ML PO SOLN
2.6000 mg | ORAL | Status: DC
  Administered 2018-11-04 – 2018-11-05 (×5): 5 mg via ORAL
  Administered 2018-11-05: 2.6 mg via ORAL
  Filled 2018-11-04 (×6): qty 1

## 2018-11-04 MED ORDER — LORAZEPAM 1 MG PO TABS
1.0000 mg | ORAL_TABLET | Freq: Four times a day (QID) | ORAL | Status: DC | PRN
  Administered 2018-11-05 – 2018-11-07 (×3): 2 mg via ORAL
  Filled 2018-11-04 (×4): qty 2
  Filled 2018-11-04: qty 1

## 2018-11-04 NOTE — Progress Notes (Signed)
Palliative: [name] is sitting up in the bed with a BiPAP partially on his face.  He is alert and oriented, and is agreeable to take a break from BiPAP.  Nursing staff places him on high flow nasal cannula with good saturation.  [name] makes and mostly keeps eye contact.  He is calm and cooperative, appropriate.  There is no family at bedside at this time due to visitor restrictions.  We talked about the use of morphine for breathlessness/air hunger.  [name] is agreeable to a trial of morphine.  I share with him the goal is to reduce breathlessness, and reduce his need to return to the hospital.  [name] goal is also to not come back to the hospital, but at this point, he tells me he would be rehospitalized as needed.  We talked about bowel regimen, orders made.  We also talked about anxiety.  We talked about disposition home when ready.  I again showed the benefit of having someone at home to assist him with the use of BiPAP as needed, again with a goal of keeping him out of the hospital.  I ask if he hass talked with his longtime (30 years) girlfriend Earl Clay, he tells me that he has not.  He is agreeable for me to call Earl Clay to discuss his health and needs.   We talked about preferred place of death.  [name] shares that his preferred place of death is home.  We talked about the difficulties for loved ones caring for someone dying at home.  We talked about the benefits of residential hospice.  I reassured [name] that we would not place him in residential hospice without his permission.  I share that if he were in a coma-like state and his daughters were to decide for him, would that be ok?  I asked if he has allowed his daughters to make these type of choices independently, and he tells me that he has.  We talked about the benefits of hospice for treat the treatable care.  I share that his first phone call should be to his palliative team, even if he is not  sure if they can help him.  We discussed the likelihood that he will have pneumonia again due to his disease process/cancer.  We talked about the benefits of treating in place when he gets pneumonia again, and the benefits of palliative/hospice care.  Call to longtime girlfriend Earl Clay at [phone].  No answer x2 calls, unable to leave voicemail message.  Conference with critical care nurse practitioner and nursing staff related to goals of care discussion, symptom management.  45 minutes extended time  [name], NP  Palliative Medicine Team  Team Phone # [phone]  Greater than 50% of this time was spent counseling and coordinating care related to the above assessment and plan.

## 2018-11-04 NOTE — Progress Notes (Signed)
CRITICAL CARE NOTE      SUBJECTIVE FINDINGS & SIGNIFICANT EVENTS   Patient remains critically ill, currently on 15L HFNC Discussed case with Dr Yevette Edwards Albertine Patricia NP discussed care plan with family today   PAST MEDICAL HISTORY   Past Medical History:  Diagnosis Date  . Asthma   . CAD (coronary artery disease)   . COPD (chronic obstructive pulmonary disease) ([place])   . Depression   . Diabetes mellitus without complication ([place])   . Difficult intubation   . GERD (gastroesophageal reflux disease)   . Hypertension   . Lung cancer ([place])   . 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 Pancreatic mass   . PTSD (post-traumatic stress disorder)   . Sleep apnea    CPAP with O2 2L/m at night     SURGICAL HISTORY   Past Surgical History:  Procedure Laterality Date  . HERNIA REPAIR Right    right inguinal hernia repair  . KNEE SURGERY Right    right knee repair  . UPPER ESOPHAGEAL ENDOSCOPIC ULTRASOUND (EUS) N/A 09/20/2015   Procedure: UPPER ESOPHAGEAL ENDOSCOPIC ULTRASOUND (EUS);  Surgeon: [name], MD;  [facility]: [facility] ENDOSCOPY;  Service: Endoscopy;  Laterality: N/A;     FAMILY HISTORY   Family History  Problem Relation Age of Onset  . Hypertension Mother   . Congestive Heart Failure Father      SOCIAL HISTORY   Social History   Tobacco Use  . Smoking status: Former Research scientist (life sciences)  . Smokeless tobacco: Never Used  Substance Use Topics  . Alcohol use: Not on file  . Drug use: Not on file     MEDICATIONS   Current Medication:  Current Facility-Administered Medications:  .  0.9 %  sodium chloride infusion, 250 mL, Intravenous, PRN, Seals, Levada Dy H, NP .  acetylcysteine (MUCOMYST) 20 % nebulizer / oral solution 4 mL, 4 mL, Nebulization, BID, Lanney Gins, Ercie Eliasen, MD, 4 mL at  11/04/18 0807 .  aspirin chewable tablet 81 mg, 81 mg, Oral, Daily, Tukov-Yual, Magdalene S, NP, 81 mg at 11/04/18 0938 .  azithromycin (ZITHROMAX) tablet 500 mg, 500 mg, Oral, q1800, Ottie Glazier, MD, 500 mg at 11/03/18 1703 .  budesonide (PULMICORT) nebulizer solution 0.25 mg, 0.25 mg, Nebulization, Q12H, Tukov-Yual, Magdalene S, NP, 0.25 mg at 11/04/18 0807 .  cefTRIAXone (ROCEPHIN) 1 g in sodium chloride 0.9 % 100 mL IVPB, 1 g, Intravenous, Q24H, Ottie Glazier, MD, Stopped at 11/03/18 1737 .  diltiazem (CARDIZEM CD) 24 hr capsule 180 mg, 180 mg, Oral, Daily, Tukov-Yual, Magdalene S, NP, 180 mg at 11/04/18 0938 .  enoxaparin (LOVENOX) injection 40 mg, 40 mg, Subcutaneous, Q24H, Lanney Gins, Sanuel Ladnier, MD, 40 mg at 11/03/18 2114 .  feeding supplement (ENSURE ENLIVE) (ENSURE ENLIVE) liquid 237 mL, 237 mL, Oral, BID BM, Tukov-Yual, Magdalene S, NP .  furosemide (LASIX) injection 40 mg, 40 mg, Intravenous, BID, Lanney Gins, Rylei Masella, MD, 40 mg at 11/04/18 0751 .  guaiFENesin-codeine 100-10 MG/5ML solution 10 mL, 10 mL, Oral, Q6H, Tukov-Yual, Magdalene S, NP, 10 mL at 11/04/18 0519 .  hydrOXYzine (ATARAX/VISTARIL) tablet 10 mg, 10 mg, Oral, QHS, Tukov-Yual, Magdalene S, NP, 10 mg at 11/03/18 2116 .  insulin aspart (novoLOG) injection 0-15 Units, 0-15 Units, Subcutaneous, Q4H, Tukov-Yual, Magdalene S, NP, 3 Units at 11/04/18 0751 .  insulin detemir (LEVEMIR) injection 7 Units, 7 Units, Subcutaneous, Daily, Ottie Glazier, MD, 7 Units at 11/04/18 [phone] .  ipratropium-albuterol (DUONEB) 0.5-2.5 (3) MG/3ML nebulizer solution 3 mL, 3 mL, Nebulization, Q4H,  Ottie Glazier, MD, 3 mL at 11/04/18 1610 .  morphine 2 MG/ML injection 2 mg, 2 mg, Intravenous, Q4H PRN, Tukov-Yual, Magdalene S, NP, 2 mg at 11/03/18 1220 .  nitroGLYCERIN 50 mg in dextrose 5 % 250 mL (0.2 mg/mL) infusion, 0-200 mcg/min, Intravenous, Continuous, Nance Pear, MD, Stopped at 11/01/18 1823 .  pantoprazole (PROTONIX) EC tablet 40 mg, 40 mg,  Oral, Daily, Paticia Stack, RPH, 40 mg at 11/04/18 9604 .  predniSONE (DELTASONE) tablet 20 mg, 20 mg, Oral, Q breakfast, Garvin Ellena, MD, 20 mg at 11/04/18 0752 .  QUEtiapine (SEROQUEL) tablet 100 mg, 100 mg, Oral, QHS, Tukov-Yual, Magdalene S, NP, 100 mg at 11/03/18 2115 .  sodium chloride flush (NS) 0.9 % injection 3 mL, 3 mL, Intravenous, Q12H, Seals, Angela H, NP, 3 mL at 11/04/18 0942 .  sodium chloride flush (NS) 0.9 % injection 3 mL, 3 mL, Intravenous, PRN, Seals, Angela H, NP .  tamsulosin (FLOMAX) capsule 0.4 mg, 0.4 mg, Oral, QPC breakfast, Tukov-Yual, Magdalene S, NP, 0.4 mg at 11/04/18 0939 .  theophylline (THEO-24) 24 hr capsule 300 mg, 300 mg, Oral, BID, Tukov-Yual, Magdalene S, NP, 300 mg at 11/04/18 0938 .  traZODone (DESYREL) tablet 100 mg, 100 mg, Oral, QHS, Tukov-Yual, Magdalene S, NP, 100 mg at 11/03/18 2116    ALLERGIES   Ampicillin; Lorazepam; Penicillins; and Zyban [bupropion]    REVIEW OF SYSTEMS    10 point ROS negative except as per subjective findings  PHYSICAL EXAMINATION   Vitals:   11/04/18 0938 11/04/18 1000  BP: 128/89 116/82  Pulse:  (!) 141  Resp:  19  Temp:    SpO2:  (!) 83%    GENERAL:Mild distress due to SOB HEAD: Normocephalic, atraumatic.  EYES: Pupils equal, round, reactive to light.  No scleral icterus.  MOUTH: Moist mucosal membrane. NECK: Supple. No thyromegaly. No nodules. No JVD.  PULMONARY: bilateral rhonchi  CARDIOVASCULAR: S1 and S2. Regular rate and rhythm. No murmurs, rubs, or gallops.  GASTROINTESTINAL: Soft, nontender, non-distended. No masses. Positive bowel sounds. No hepatosplenomegaly.  MUSCULOSKELETAL: No swelling, clubbing, or edema.  NEUROLOGIC: Mild distress due to acute illness SKIN:intact,warm,dry   LABS AND IMAGING       LAB RESULTS: Recent Labs  Lab 11/02/18 0332 11/03/18 0410 11/04/18 0447  NA 142 142 139  K 3.3* 3.9 3.5  CL 95* 94* 89*  CO2 36* 37* 40*  BUN 27* 31* 32*  CREATININE  1.51* 1.45* 1.53*  GLUCOSE 160* 158* 163*   Recent Labs  Lab 11/02/18 0332 11/03/18 0410 11/04/18 0447  HGB 12.4* 12.9* 12.9*  HCT 41.1 42.1 42.3  WBC 11.0* 14.8* 12.8*  PLT 245 285 277     IMAGING RESULTS: No results found.    ASSESSMENT AND PLAN     -Multidisciplinary rounds held today  Acuteon ChronicHypoxic Respiratory Failure -likely due to post obstructive right lung pneumonia in context of lung cancer.       -noncompliance with BIPAP in ICU -Currently on regimen for community-acquired pneumonia with Zithromax and Rocephin -Oncology on case - appreciate input   -palliative care consultation - Goals of care  -mild-mod pleural effusion with mild LLL atelectasis contributing to respiratory compromise.  - will place on METAneb for BPH-Mucomyst 20% twice daily    -Net 8.6L negative  -increasing lasix to BID 40  -Continue with  prednisone   - will add robinul po for thickened high volume airway secretions     OSA  - will place on  CPAP nocturnally    Accelerated hypertension - resolved s/p cardizem drip   ID -continue IV abx as prescibed -follow up cultures  GI/Nutrition GI PROPHYLAXIS as indicated DIET-->TF's as tolerated Constipation protocol as indicated  ENDO - ICU hypoglycemic\Hyperglycemia protocol -check FSBS per protocol   ELECTROLYTES -follow labs as needed -replace as needed -pharmacy consultation   DVT/GI PRX ordered -SCDs  TRANSFUSIONS AS NEEDED MONITOR FSBS ASSESS the need for LABS as needed   Critical care provider statement:  Critical care time (minutes):35 Critical care time was exclusive of: Separately billable procedures and treating other patients Critical care was necessary to treat or prevent imminent or life-threatening deterioration of the following conditions:Acute hypoxemic respiratory failure, severe COPD, OSA, multiple comorbid conditions Critical care  was time spent personally by me on the following activities: Development of treatment plan with patient or surrogate, discussions with consultants, evaluation of patient's response to treatment, examination of patient, obtaining history from patient or surrogate, ordering and performing treatments and interventions, ordering and review of laboratory studies and re-evaluation of patient's condition. I assumed direction of critical care for this patient from another provider in my specialty: no   This document was prepared using Dragon voice recognition software and may include unintentional dictation errors.     Ottie Glazier, M.D.  Division of [place]

## 2018-11-04 NOTE — Progress Notes (Signed)
Updated pts daughter Jerolyn Center via telephone regarding pts condition and plan of care all questions were answered will continue to monitor and assess pt.  Marda Stalker, [place] Pager [phone] (please enter 7 digits) PCCM Consult Pager [phone] (please enter 7 digits)

## 2018-11-04 NOTE — Telephone Encounter (Signed)
Colette-please cancel patient's PET scan  Follow-up appointment in 3 months-video visit/no labs.

## 2018-11-04 NOTE — Progress Notes (Signed)
[facility] [place] NAME: Earl Clay    MR#:  [id number]  DATE OF BIRTH:  [birth year]    CHIEF COMPLAINT:   Chief Complaint  Patient presents with  . Respiratory Distress   More SOB overnight. Placed on Bipap. Lethargic today  REVIEW OF SYSTEMS:   Review of Systems  Unable to perform ROS: Mental status change     DRUG ALLERGIES:   Allergies  Allergen Reactions  . Ampicillin Hives and Other (See Comments)    Has patient had a PCN reaction causing immediate rash, facial/tongue/throat swelling, SOB or lightheadedness with hypotension: No Has patient had a PCN reaction causing severe rash involving mucus membranes or skin necrosis: No Has patient had a PCN reaction that required hospitalization No Has patient had a PCN reaction occurring within the last 10 years: No If all of the above answers are "NO", then may proceed with Cephalosporin use.  . Lorazepam     Worsening confusion  . Penicillins Hives and Other (See Comments)    Has patient had a PCN reaction causing immediate rash, facial/tongue/throat swelling, SOB or lightheadedness with hypotension: No Has patient had a PCN reaction causing severe rash involving mucus membranes or skin necrosis: No Has patient had a PCN reaction that required hospitalization No Has patient had a PCN reaction occurring within the last 10 years: No If all of the above answers are "NO", then may proceed with Cephalosporin use.  . Zyban [Bupropion] Rash    VITALS:  Blood pressure 116/82, pulse (!) 118, temperature 97.9 F (36.6 C), temperature source Axillary, resp. rate 16, height 5\' 6"  (1.676 m), weight 74.8 kg, SpO2 (!) 87 %.  PHYSICAL EXAMINATION:  GENERAL:  78 y.o.-year-old patient lying in the bed . lethargic EYES: Pupils equal, round, reactive to light and accommodation. No scleral icterus. Extraocular muscles intact.  HEENT: Head atraumatic, normocephalic. Oropharynx and  nasopharynx clear.  NECK:  Supple, no jugular venous distention. No thyroid enlargement, no tenderness.  LUNGS: Diminished air entry bilaterally.Marland Kitchen   CARDIOVASCULAR: S1, S2 normal. No murmurs, rubs, or gallops.  ABDOMEN: Soft, nontender, nondistended. Bowel sounds present. No organomegaly or mass.  EXTREMITIES: No pedal edema, cyanosis, or clubbing.  NEUROLOGIC: Not following instructions PSYCHIATRIC: The patient is lethargic SKIN: No obvious rash, lesion, or ulcer.    LABORATORY PANEL:   CBC Recent Labs  Lab 11/04/18 0447  WBC 12.8*  HGB 12.9*  HCT 42.3  PLT 277   ------------------------------------------------------------------------------------------------------------------  Chemistries  Recent Labs  Lab 11/04/18 0447  NA 139  K 3.5  CL 89*  CO2 40*  GLUCOSE 163*  BUN 32*  CREATININE 1.53*  CALCIUM 9.2  MG 2.3   ------------------------------------------------------------------------------------------------------------------  Cardiac Enzymes Recent Labs  Lab 10/31/18 1808  TROPONINI 0.08*   ------------------------------------------------------------------------------------------------------------------  RADIOLOGY:  No results found.  EKG:   Orders placed or performed during the hospital encounter of 10/31/18  . ED EKG  . ED EKG  . EKG 12-Lead  . EKG 12-Lead    ASSESSMENT AND PLAN:   1. Acute respiratory failure on chronic respiratory failure with hypoxia secondary to community-acquired pneumonia. He does have poor baseline pulmonary function secondary to his COPD and lung cancer.  On Bipap today . Worsening continue, IV antibiotics with Rocephin, Zithromax COVID-19 test has been negative, CT chest showed worsening of left hilar cancer, patient is all already scheduled for PET scan with oncologist Dr. Rogue Bussing.    2.  COPD exacerbation, continue  oxygen, steroids.  Continue bronchodilators with Pulmicort, DuoNeb's.  4. Hypokalemia:  Replaced.  5.  Acute renal failure due to dehydration, pneumonia Stable  Overall prognosis poor due to underlying lung cancer, advanced age. Worsening today and back on Bipap. Critically ill   All the records are reviewed and case discussed with Care Management/Social Workerr. Management plans discussed with the patient, family and they are in agreement.  CODE STATUS: DNR  TOTAL TIME TAKING CARE OF THIS PATIENT: 35 minutes.    [name] M.D on 11/04/2018 at 12:03 PM  Between 7am to 6pm - Pager - [phone]  After 6pm go to [email or web address] - password [email or web address] [facility]  Office  [phone]  CC: Primary care physician; [name], MD   Note: This dictation was prepared with Dragon dictation along with smaller phrase technology. Any transcriptional errors that result from this process are unintentional.

## 2018-11-04 NOTE — Progress Notes (Signed)
Earl Clay   DOB:[birth year]   YD#:[id number]    Subjective: Patient continues to have shortness of breath.  Cough.  He is requiring BiPAP most of the time.  Objective:  Vitals:   11/04/18 1800 11/04/18 1943  BP: 94/63 (!) 117/104  Pulse: 93   Resp: (!) 21   Temp:  (!) 97.3 F (36.3 C)  SpO2: 92%      Intake/Output Summary (Last 24 hours) at 11/04/2018 1959 Last data filed at 11/04/2018 1943 Gross per 24 hour  Intake 633 ml  Output 2325 ml  Net -1692 ml    Physical Exam  Constitutional: He is oriented to person, place, and time.  Frail-appearing Caucasian male patient.  Resting in bed.  He is on high flow nasal oxygen.  HENT:  Head: Normocephalic and atraumatic.  Mouth/Throat: Oropharynx is clear and moist. No oropharyngeal exudate.  Eyes: Pupils are equal, round, and reactive to light.  Neck: Normal range of motion. Neck supple.  Cardiovascular: Normal rate and regular rhythm.  Pulmonary/Chest: No respiratory distress. He has no wheezes.  Decreased air entry bilaterally.  Abdominal: Soft. Bowel sounds are normal. He exhibits no distension and no mass. There is no abdominal tenderness. There is no rebound and no guarding.  Musculoskeletal: Normal range of motion.        General: No tenderness or edema.  Neurological: He is alert and oriented to person, place, and time.  Skin: Skin is warm.  Psychiatric: Affect normal.     Labs:  Lab Results  Component Value Date   WBC 12.8 (H) 11/04/2018   HGB 12.9 (L) 11/04/2018   HCT 42.3 11/04/2018   MCV 87.2 11/04/2018   PLT 277 11/04/2018   NEUTROABS 10.1 (H) 11/02/2018    Lab Results  Component Value Date   NA 139 11/04/2018   K 3.5 11/04/2018   CL 89 (L) 11/04/2018   CO2 40 (H) 11/04/2018    Studies:  No results found.  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  # LUL cancer status post RT [2015]-surveillance April 2020 CT scan shows concern for progression left hilar region approximately 3.5 x 4.9 cm.  See discussion below.  #Acute on chronic respiratory failure-COPD exacerbation/pneumonia-as per ICU team/primary team.   Needing BiPAP most of the time. Currently stable.   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 [Dr.Lateef]   #Overall prognosis/plan of care-I had long discussion with the patient and family [daughter Haynes Dage over the phone]-given patient's frail/respiratory status-he is not a good candidate for any treatment even if the PET scan shows recurrent disease.  As a PET scan would not clinically change management-decided to hold on the PET scan.  I discussed regarding hospice, daughter not interested.  However she feels it would be reasonable to have palliative care following patient at home.  Agree with DNR/DNI.  Discussed with Dr.Aleskerov/appreciate recommendations.   # 25 minutes face-to-face with the patient discussing the above plan of care; more than 50% of time spent on prognosis/ natural history; counseling and coordination.       Cammie Sickle, MD 11/04/2018  7:59 PM

## 2018-11-04 NOTE — Progress Notes (Signed)
PHARMACY CONSULT NOTE - FOLLOW UP  Pharmacy Consult for Electrolyte Monitoring and Replacement   Recent Labs: Potassium (mmol/L)  Date Value  11/04/2018 3.5  05/19/2014 4.4   Magnesium (mg/dL)  Date Value  11/04/2018 2.3  05/16/2014 1.9   Calcium (mg/dL)  Date Value  11/04/2018 9.2   Calcium, Total (mg/dL)  Date Value  05/19/2014 8.3 (L)   Albumin (g/dL)  Date Value  08/08/2018 3.6  05/16/2014 3.9   Phosphorus (mg/dL)  Date Value  11/04/2018 2.8   Sodium (mmol/L)  Date Value  11/04/2018 139  05/19/2014 139     Assessment: Patient admitted for CAP/COPD exacerbation. PMH significant for CHF, CKD III.  Goal of Therapy:  Potassium ~4.0, Magnesium ~2.0.  Plan:  Patient is receiving furosemide 40 mg IV BID.  Potassium 53mEq PO x 2.  Pharmacy will continue to monitor and adjust per consult.    Paticia Stack, PharmD Pharmacy Resident  11/04/2018 1:35 PM

## 2018-11-04 NOTE — Progress Notes (Signed)
   11/04/18 2000  Clinical Encounter Type  Visited With Patient  Visit Type Initial;Spiritual support;Critical Care  Spiritual Encounters  Spiritual Needs Prayer;Emotional;Grief support

## 2018-11-05 ENCOUNTER — Ambulatory Visit: Payer: Medicare Other | Admitting: Internal Medicine

## 2018-11-05 ENCOUNTER — Inpatient Hospital Stay: Payer: Medicare Other | Admitting: Internal Medicine

## 2018-11-05 ENCOUNTER — Inpatient Hospital Stay: Payer: Medicare Other

## 2018-11-05 ENCOUNTER — Other Ambulatory Visit: Payer: Medicare Other

## 2018-11-05 LAB — CBC WITH DIFFERENTIAL/PLATELET
Abs Immature Granulocytes: 0.05 10*3/uL (ref 0.00–0.07)
Basophils Absolute: 0 10*3/uL (ref 0.0–0.1)
Basophils Relative: 0 %
Eosinophils Absolute: 0.1 10*3/uL (ref 0.0–0.5)
Eosinophils Relative: 1 %
HCT: 44.9 % (ref 39.0–52.0)
Hemoglobin: 13.7 g/dL (ref 13.0–17.0)
Immature Granulocytes: 0 %
Lymphocytes Relative: 9 %
Lymphs Abs: 1.4 10*3/uL (ref 0.7–4.0)
MCH: 26.8 pg (ref 26.0–34.0)
MCHC: 30.5 g/dL (ref 30.0–36.0)
MCV: 87.9 fL (ref 80.0–100.0)
Monocytes Absolute: 1.3 10*3/uL — ABNORMAL HIGH (ref 0.1–1.0)
Monocytes Relative: 9 %
Neutro Abs: 12 10*3/uL — ABNORMAL HIGH (ref 1.7–7.7)
Neutrophils Relative %: 81 %
Platelets: 298 10*3/uL (ref 150–400)
RBC: 5.11 MIL/uL (ref 4.22–5.81)
RDW: 17 % — ABNORMAL HIGH (ref 11.5–15.5)
WBC: 14.8 10*3/uL — ABNORMAL HIGH (ref 4.0–10.5)
nRBC: 0 % (ref 0.0–0.2)

## 2018-11-05 LAB — GLUCOSE, CAPILLARY
Glucose-Capillary: 103 mg/dL — ABNORMAL HIGH (ref 70–99)
Glucose-Capillary: 195 mg/dL — ABNORMAL HIGH (ref 70–99)
Glucose-Capillary: 177 mg/dL — ABNORMAL HIGH (ref 70–99)
Glucose-Capillary: 165 mg/dL — ABNORMAL HIGH (ref 70–99)
Glucose-Capillary: 184 mg/dL — ABNORMAL HIGH (ref 70–99)

## 2018-11-05 LAB — CULTURE, BLOOD (ROUTINE X 2)
Culture: NO GROWTH
Culture: NO GROWTH
Special Requests: ADEQUATE
Special Requests: ADEQUATE

## 2018-11-05 LAB — BASIC METABOLIC PANEL
Anion gap: 11 (ref 5–15)
BUN: 33 mg/dL — ABNORMAL HIGH (ref 8–23)
CO2: 39 mmol/L — ABNORMAL HIGH (ref 22–32)
Calcium: 9.5 mg/dL (ref 8.9–10.3)
Chloride: 88 mmol/L — ABNORMAL LOW (ref 98–111)
Creatinine, Ser: 1.48 mg/dL — ABNORMAL HIGH (ref 0.61–1.24)
GFR calc Af Amer: 53 mL/min — ABNORMAL LOW (ref >60)
GFR calc non Af Amer: 46 mL/min — ABNORMAL LOW (ref >60)
Glucose, Bld: 102 mg/dL — ABNORMAL HIGH (ref 70–99)
Potassium: 4 mmol/L (ref 3.5–5.1)
Sodium: 138 mmol/L (ref 135–145)

## 2018-11-05 MED ORDER — DEXMEDETOMIDINE HCL IN NACL 400 MCG/100ML IV SOLN
0.4000 ug/kg/h | INTRAVENOUS | Status: DC
  Administered 2018-11-05 – 2018-11-06 (×2): 0.4 ug/kg/h via INTRAVENOUS
  Filled 2018-11-05: qty 100

## 2018-11-05 MED ORDER — BUDESONIDE 0.5 MG/2ML IN SUSP
0.5000 mg | Freq: Two times a day (BID) | RESPIRATORY_TRACT | Status: DC
  Administered 2018-11-05 – 2018-11-09 (×8): 0.5 mg via RESPIRATORY_TRACT
  Filled 2018-11-05 (×8): qty 2

## 2018-11-05 MED ORDER — MORPHINE SULFATE (CONCENTRATE) 10 MG/0.5ML PO SOLN
2.6000 mg | ORAL | Status: DC
  Administered 2018-11-06: 2.6 mg via SUBLINGUAL
  Administered 2018-11-06: 5 mg via SUBLINGUAL
  Administered 2018-11-06: 04:00:00 2.6 mg via SUBLINGUAL
  Filled 2018-11-05 (×3): qty 1

## 2018-11-05 NOTE — Progress Notes (Signed)
PHARMACY CONSULT NOTE - FOLLOW UP  Pharmacy Consult for Electrolyte Monitoring and Replacement   Recent Labs: Potassium (mmol/L)  Date Value  11/05/2018 4.0  05/19/2014 4.4   Magnesium (mg/dL)  Date Value  11/04/2018 2.3  05/16/2014 1.9   Calcium (mg/dL)  Date Value  11/05/2018 9.5   Calcium, Total (mg/dL)  Date Value  05/19/2014 8.3 (L)   Albumin (g/dL)  Date Value  08/08/2018 3.6  05/16/2014 3.9   Phosphorus (mg/dL)  Date Value  11/04/2018 2.8   Sodium (mmol/L)  Date Value  11/05/2018 138  05/19/2014 139     Assessment: Patient admitted for CAP/COPD exacerbation. PMH significant for CHF, CKD III.  Goal of Therapy:  Potassium ~4.0, Magnesium ~2.0.  Plan:  Patient is receiving furosemide 40 mg IV BID.  Electrolytes WNL. Will order electrolytes with AM labs.  Pharmacy will continue to monitor and adjust per consult.    Paticia Stack, PharmD Pharmacy Resident  11/05/2018 11:48 AM

## 2018-11-05 NOTE — Progress Notes (Signed)
Discuss pt condition and plan of care with pts daughter Jerolyn Center via telephone all questions answered.  Marda Stalker, [place] Pager [phone] (please enter 7 digits) PCCM Consult Pager [phone] (please enter 7 digits)

## 2018-11-05 NOTE — Progress Notes (Signed)
Palliative: Mr. Rominger is lying quietly in bed.  His breathing is shallow, but he is oxygenating adequately.  He is so comfortable I do not attempt to wake him.  There is no family at bedside at this time due to visitor restrictions.  Conference with nursing staff related to symptom management and placement of indwelling Foley catheter.  Conference with CCM nurse practitioner and MD related to symptom management and patient needs.  Call to daughter, Hennie Duos at [phone] 07/15/2000.  No answer, unable to leave voicemail message.  13 minutes [name], NP  Palliative Medicine Team  Team Phone # [phone]  Greater than 50% of this time was spent counseling and coordinating care related to the above assessment and plan.

## 2018-11-05 NOTE — Progress Notes (Signed)
Inpatient Diabetes Program Recommendations  AACE/ADA: New Consensus Statement on Inpatient Glycemic Control   Target Ranges:  Prepandial:   less than 140 mg/dL      Peak postprandial:   less than 180 mg/dL (1-2 hours)      Critically ill patients:  140 - 180 mg/dL   Results for Earl Clay, Earl Clay (MRN [id number]) as of 11/05/2018 08:38  Ref. Range 11/04/2018 07:45 11/04/2018 11:31 11/04/2018 16:29 11/04/2018 19:21 11/04/2018 23:29 11/05/2018 03:15  Glucose-Capillary Latest Ref Range: 70 - 99 mg/dL 154 (H) 131 (H) 287 (H) 328 (H) 182 (H) 103 (H)   Review of Glycemic Control  Diabetes history: DM2 Outpatient Diabetes medications: Levemir 20 units daily, Novolog 10-20 units TID with meals Current orders for Inpatient glycemic control: Levemir 7 units daily, Novolog 0-15 units Q4H; Prednisone 20 mg daily   Inpatient Diabetes Program Recommendations:   Insulin-Meal Coverage: If steroids are continued, please consider ordering Novolog 3 units TID with meals for meal coverage if patient eats at least 50% of meals.  Thanks, Barnie Alderman, RN, MSN, CDE Diabetes Coordinator Inpatient Diabetes Program [phone] (Team Pager from 8am to 5pm)

## 2018-11-05 NOTE — Progress Notes (Signed)
[facility] NAME: Willliam Clay    MR#:  [id number]  DATE OF BIRTH:  [birth year]    CHIEF COMPLAINT:   Chief Complaint  Patient presents with  . Respiratory Distress   Awake but confused. On HFNC Still critically ill  REVIEW OF SYSTEMS:   Review of Systems  Unable to perform ROS: Mental status change   DRUG ALLERGIES:   Allergies  Allergen Reactions  . Ampicillin Hives and Other (See Comments)    Has patient had a PCN reaction causing immediate rash, facial/tongue/throat swelling, SOB or lightheadedness with hypotension: No Has patient had a PCN reaction causing severe rash involving mucus membranes or skin necrosis: No Has patient had a PCN reaction that required hospitalization No Has patient had a PCN reaction occurring within the last 10 years: No If all of the above answers are "NO", then may proceed with Cephalosporin use.  . Lorazepam     Worsening confusion  . Penicillins Hives and Other (See Comments)    Has patient had a PCN reaction causing immediate rash, facial/tongue/throat swelling, SOB or lightheadedness with hypotension: No Has patient had a PCN reaction causing severe rash involving mucus membranes or skin necrosis: No Has patient had a PCN reaction that required hospitalization No Has patient had a PCN reaction occurring within the last 10 years: No If all of the above answers are "NO", then may proceed with Cephalosporin use.  . Zyban [Bupropion] Rash    VITALS:  Blood pressure 111/72, pulse (!) 102, temperature 98.4 F (36.9 C), temperature source Oral, resp. rate (!) 21, height 5\' 6"  (1.676 m), weight 74.8 kg, SpO2 93 %.  PHYSICAL EXAMINATION:  GENERAL:  78 y.o.-year-old patient lying in the bed .  EYES: Pupils equal, round, reactive to light and accommodation. No scleral icterus. Extraocular muscles intact.  HEENT: Head atraumatic, normocephalic. Oropharynx and nasopharynx clear.   NECK:  Supple, no jugular venous distention. No thyroid enlargement, no tenderness.  LUNGS: Diminished air entry bilaterally.Marland Kitchen   CARDIOVASCULAR: S1, S2 normal. No murmurs, rubs, or gallops.  ABDOMEN: Soft, nontender, nondistended. Bowel sounds present. No organomegaly or mass.  EXTREMITIES: No pedal edema, cyanosis, or clubbing.  NEUROLOGIC: Moves all 4 extremities PSYCHIATRIC: The patient is pleasantly confused SKIN: No obvious rash, lesion, or ulcer.   LABORATORY PANEL:   CBC Recent Labs  Lab 11/05/18 0329  WBC 14.8*  HGB 13.7  HCT 44.9  PLT 298   ------------------------------------------------------------------------------------------------------------------  Chemistries  Recent Labs  Lab 11/04/18 0447 11/05/18 0329  NA 139 138  K 3.5 4.0  CL 89* 88*  CO2 40* 39*  GLUCOSE 163* 102*  BUN 32* 33*  CREATININE 1.53* 1.48*  CALCIUM 9.2 9.5  MG 2.3  --    ------------------------------------------------------------------------------------------------------------------  Cardiac Enzymes Recent Labs  Lab 10/31/18 1808  TROPONINI 0.08*   ------------------------------------------------------------------------------------------------------------------  RADIOLOGY:  Dg Chest Port 1 View  Result Date: 11/05/2018 CLINICAL DATA:  78 year old male with shortness of breath. Lung cancer and COPD on home BiPAP. EXAM: PORTABLE CHEST 1 VIEW COMPARISON:  10/31/2018 and earlier. FINDINGS: Portable AP semi upright view at 0248 hours. Lower lung volumes. Stable cardiac size and mediastinal contours. Right suprahilar masslike opacity redemonstrated. No pneumothorax. Pulmonary vascularity appears stable. Lung base ventilation now appears symmetric. Negative visible bowel gas pattern. No acute osseous abnormality identified. IMPRESSION: Lower lung volumes but no new cardiopulmonary abnormality when compared to the CT on 10/26/2018 (please also see that report). Electronically  Signed   By: Genevie Ann M.D.   On: 11/05/2018 03:32    EKG:   Orders placed or performed during the hospital encounter of 10/31/18  . ED EKG  . ED EKG  . EKG 12-Lead  . EKG 12-Lead    ASSESSMENT AND PLAN:   1. Acute respiratory failure on chronic respiratory failure with hypoxia secondary to community-acquired pneumonia. He does have poor baseline pulmonary function secondary to his COPD and lung cancer.  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 Rogue Bussing.    2.  COPD exacerbation, continue oxygen, steroids.  Continue bronchodilators with Pulmicort, DuoNeb's.  4. Hypokalemia: Replaced.  5.  Acute renal failure due to dehydration, pneumonia Stable  Overall prognosis poor due to underlying lung cancer, advanced age. Appropriate for hospice care  All the records are reviewed and case discussed with Care Management/Social Worker Management plans discussed with the patient, family and they are in agreement.  CODE STATUS: DNR  TOTAL TIME TAKING CARE OF THIS PATIENT: 35 minutes.   [name] M.D on 11/05/2018 at 10:48 AM  Between 7am to 6pm - Pager - [phone]  After 6pm go to [email or web address] - password [email or web address] [facility]  Office  [phone]  CC: Primary care physician; [name], MD   Note: This dictation was prepared with Dragon dictation along with smaller phrase technology. Any transcriptional errors that result from this process are unintentional.

## 2018-11-05 NOTE — Progress Notes (Signed)
CRITICAL CARE NOTE        SUBJECTIVE FINDINGS & SIGNIFICANT EVENTS   Patient remains critically ill Prognosis is guarded Family conference done by [facility] NP yesterday Working with [employer] to set up for AfloVest therapy at home Setting up home health  PAST MEDICAL HISTORY   Past Medical History:  Diagnosis Date  . Asthma   . CAD (coronary artery disease)   . COPD (chronic obstructive pulmonary disease) ([place])   . Depression   . Diabetes mellitus without complication ([place])   . Difficult intubation   . GERD (gastroesophageal reflux disease)   . Hypertension   . Lung cancer ([place])   . 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 Pancreatic mass   . PTSD (post-traumatic stress disorder)   . Sleep apnea    CPAP with O2 2L/m at night     SURGICAL HISTORY   Past Surgical History:  Procedure Laterality Date  . HERNIA REPAIR Right    right inguinal hernia repair  . KNEE SURGERY Right    right knee repair  . UPPER ESOPHAGEAL ENDOSCOPIC ULTRASOUND (EUS) N/A 09/20/2015   Procedure: UPPER ESOPHAGEAL ENDOSCOPIC ULTRASOUND (EUS);  Surgeon: [name], MD;  Location: [facility] ENDOSCOPY;  Service: Endoscopy;  Laterality: N/A;     FAMILY HISTORY   Family History  Problem Relation Age of Onset  . Hypertension Mother   . Congestive Heart Failure Father      SOCIAL HISTORY   Social History   Tobacco Use  . Smoking status: Former Research scientist (life sciences)  . Smokeless tobacco: Never Used  Substance Use Topics  . Alcohol use: Not on file  . Drug use: Not on file     MEDICATIONS   Current Medication:  Current Facility-Administered Medications:  .  0.9 %  sodium chloride infusion, 250 mL, Intravenous, PRN, Seals, Levada Dy H, NP .  acetylcysteine (MUCOMYST) 20 % nebulizer / oral solution 4 mL, 4 mL, Nebulization,  BID, Lanney Gins, Pearson Picou, MD, 4 mL at 11/04/18 2014 .  aspirin chewable tablet 81 mg, 81 mg, Oral, Daily, Tukov-Yual, Magdalene S, NP, 81 mg at 11/05/18 0918 .  bisacodyl (DULCOLAX) suppository 10 mg, 10 mg, Rectal, Daily PRN, Dove, Tasha A, NP .  budesonide (PULMICORT) nebulizer solution 0.25 mg, 0.25 mg, Nebulization, Q12H, Tukov-Yual, Magdalene S, NP, 0.25 mg at 11/05/18 0727 .  cefTRIAXone (ROCEPHIN) 1 g in sodium chloride 0.9 % 100 mL IVPB, 1 g, Intravenous, Q24H, Ottie Glazier, MD, Stopped at 11/04/18 1741 .  diltiazem (CARDIZEM CD) 24 hr capsule 180 mg, 180 mg, Oral, Daily, Tukov-Yual, Magdalene S, NP, 180 mg at 11/05/18 0918 .  enoxaparin (LOVENOX) injection 40 mg, 40 mg, Subcutaneous, Q24H, Ikea Demicco, MD, 40 mg at 11/04/18 2113 .  furosemide (LASIX) injection 40 mg, 40 mg, Intravenous, BID, Lanney Gins, Yashika Mask, MD, 40 mg at 11/05/18 0915 .  glycopyrrolate (ROBINUL) tablet 1 mg, 1 mg, Oral, BID, Socrates Cahoon, MD, 1 mg at 11/04/18 2113 .  hydrOXYzine (ATARAX/VISTARIL) tablet 10 mg, 10 mg, Oral, QHS, Tukov-Yual, Magdalene S, NP, 10 mg at 11/04/18 2113 .  insulin aspart (novoLOG) injection 0-15 Units, 0-15 Units, Subcutaneous, Q4H, Tukov-Yual, Magdalene S, NP, 3 Units at 11/05/18 0915 .  insulin detemir (LEVEMIR) injection 7 Units, 7 Units, Subcutaneous, Daily, Ottie Glazier, MD, 7 Units at 11/05/18 0917 .  ipratropium-albuterol (DUONEB) 0.5-2.5 (3) MG/3ML nebulizer solution 3 mL, 3 mL, Nebulization, Q4H, Tacari Repass, MD, 3 mL at 11/05/18 0727 .  LORazepam (ATIVAN) tablet 1-2 mg, 1-2 mg,  Oral, Q6H PRN, Dove, Tasha A, NP .  morphine CONCENTRATE 10 MG/0.5ML oral solution 2.6-5 mg, 2.6-5 mg, Oral, Q4H, Dove, Tasha A, NP, 2.6 mg at 11/05/18 0916 .  nitroGLYCERIN 50 mg in dextrose 5 % 250 mL (0.2 mg/mL) infusion, 0-200 mcg/min, Intravenous, Continuous, Nance Pear, MD, Stopped at 11/01/18 1823 .  pantoprazole (PROTONIX) EC tablet 40 mg, 40 mg, Oral, Daily, Paticia Stack, RPH, 40 mg  at 11/05/18 9147 .  predniSONE (DELTASONE) tablet 20 mg, 20 mg, Oral, Q breakfast, Lanecia Sliva, MD, 20 mg at 11/05/18 0916 .  QUEtiapine (SEROQUEL) tablet 100 mg, 100 mg, Oral, QHS, Tukov-Yual, Magdalene S, NP, 100 mg at 11/04/18 2113 .  senna-docusate (Senokot-S) tablet 2 tablet, 2 tablet, Oral, BID, Dove, Tasha A, NP, 2 tablet at 11/05/18 0916 .  sodium chloride flush (NS) 0.9 % injection 3 mL, 3 mL, Intravenous, Q12H, Seals, Angela H, NP, 3 mL at 11/05/18 0919 .  sodium chloride flush (NS) 0.9 % injection 3 mL, 3 mL, Intravenous, PRN, Seals, Angela H, NP .  tamsulosin (FLOMAX) capsule 0.4 mg, 0.4 mg, Oral, QPC breakfast, Tukov-Yual, Magdalene S, NP, 0.4 mg at 11/05/18 0916 .  theophylline (THEO-24) 24 hr capsule 300 mg, 300 mg, Oral, BID, Tukov-Yual, Magdalene S, NP, 300 mg at 11/05/18 0917 .  traZODone (DESYREL) tablet 100 mg, 100 mg, Oral, QHS, Tukov-Yual, Magdalene S, NP, 100 mg at 11/04/18 2113    ALLERGIES   Ampicillin; Lorazepam; Penicillins; and Zyban [bupropion]    REVIEW OF SYSTEMS     10 point ROS neg except as per subj findings.   PHYSICAL EXAMINATION   Vitals:   11/05/18 0400 11/05/18 0854  BP: 111/72   Pulse: (!) 102   Resp: (!) 21   Temp:  98.4 F (36.9 C)  SpO2: 93%     GENERAL:mild distress due to resp failure HEAD: Normocephalic, atraumatic.  EYES: Pupils equal, round, reactive to light.  No scleral icterus.  MOUTH: Moist mucosal membrane. NECK: Supple. No thyromegaly. No nodules. No JVD.  PULMONARY: b/l rhonchi CARDIOVASCULAR: S1 and S2. Regular rate and rhythm. No murmurs, rubs, or gallops.  GASTROINTESTINAL: Soft, nontender, non-distended. No masses. Positive bowel sounds. No hepatosplenomegaly.  MUSCULOSKELETAL: No swelling, clubbing, or edema.  NEUROLOGIC: Mild distress due to acute illness SKIN:intact,warm,dry   LABS AND IMAGING     LAB RESULTS: Recent Labs  Lab 11/03/18 0410 11/04/18 0447 11/05/18 0329  NA 142 139 138  K 3.9  3.5 4.0  CL 94* 89* 88*  CO2 37* 40* 39*  BUN 31* 32* 33*  CREATININE 1.45* 1.53* 1.48*  GLUCOSE 158* 163* 102*   Recent Labs  Lab 11/03/18 0410 11/04/18 0447 11/05/18 0329  HGB 12.9* 12.9* 13.7  HCT 42.1 42.3 44.9  WBC 14.8* 12.8* 14.8*  PLT 285 277 298     IMAGING RESULTS: Dg Chest Port 1 View  Result Date: 11/05/2018 CLINICAL DATA:  78 year old male with shortness of breath. Lung cancer and COPD on home BiPAP. EXAM: PORTABLE CHEST 1 VIEW COMPARISON:  10/31/2018 and earlier. FINDINGS: Portable AP semi upright view at 0248 hours. Lower lung volumes. Stable cardiac size and mediastinal contours. Right suprahilar masslike opacity redemonstrated. No pneumothorax. Pulmonary vascularity appears stable. Lung base ventilation now appears symmetric. Negative visible bowel gas pattern. No acute osseous abnormality identified. IMPRESSION: Lower lung volumes but no new cardiopulmonary abnormality when compared to the CT on 10/26/2018 (please also see that report). Electronically Signed   By: Herminio Heads.D.  On: 11/05/2018 03:32      ASSESSMENT AND PLAN     -Multidisciplinary rounds held today  Acuteon ChronicHypoxic Respiratory Failure -likely due to post obstructive right lung pneumonia in context of lung cancer.       -noncompliance with BIPAP in ICU -woking with [employer] to get vest therapy at home, and home health is being set up -Currently on regimen for community-acquired pneumonia with Zithromax and Rocephin -Oncology on case - appreciate input   -palliative care consultation - Goals of care  -mild-mod pleural effusion with mild LLL atelectasis contributing to respiratory compromise.  - will place on METAneb for BPH-Mucomyst 20% twice daily    -Net 8.6L negative  -increasing lasix to BID 40   -Continue withprednisone    - will add robinul po for thickened high volume airway secretions  Daily productive cough for at least 6 months continuous.  Frequent (more than 2/yr) exacerbations/chest infections requiring antibiotic therapy.  Failed PEP therapy and failed breathing/drainage techniques   OSA  - will place on CPAP nocturnally    Accelerated hypertension - resolved s/p cardizem drip   ID -continue IV abx as prescibed -follow up cultures  GI/Nutrition GI PROPHYLAXIS as indicated DIET-->TF's as tolerated Constipation protocol as indicated  ENDO - ICU hypoglycemic\Hyperglycemia protocol -check FSBS per protocol   ELECTROLYTES -follow labs as needed -replace as needed -pharmacy consultation   DVT/GI PRX ordered -SCDs  TRANSFUSIONS AS NEEDED MONITOR FSBS ASSESS the need for LABS as needed   Critical care provider statement:  Critical care time (minutes):34 Critical care time was exclusive of: Separately billable procedures and treating other patients Critical care was necessary to treat or prevent imminent or life-threatening deterioration of the following conditions:Acute hypoxemic respiratory failure, severe COPD, OSA, multiple comorbid conditions Critical care was time spent personally by me on the following activities: Development of treatment plan with patient or surrogate, discussions with consultants, evaluation of patient's response to treatment, examination of patient, obtaining history from patient or surrogate, ordering and performing treatments and interventions, ordering and review of laboratory studies and re-evaluation of patient's condition. I assumed direction of critical care for this patient from another provider in my specialty: no   Ottie Glazier, M.D.  Division of [place]

## 2018-11-06 LAB — CBC WITH DIFFERENTIAL/PLATELET
Abs Immature Granulocytes: 0.04 10*3/uL (ref 0.00–0.07)
Basophils Absolute: 0 10*3/uL (ref 0.0–0.1)
Basophils Relative: 0 %
Eosinophils Absolute: 0.2 10*3/uL (ref 0.0–0.5)
Eosinophils Relative: 2 %
HCT: 42.7 % (ref 39.0–52.0)
Hemoglobin: 12.9 g/dL — ABNORMAL LOW (ref 13.0–17.0)
Immature Granulocytes: 0 %
Lymphocytes Relative: 9 %
Lymphs Abs: 1.1 10*3/uL (ref 0.7–4.0)
MCH: 26.5 pg (ref 26.0–34.0)
MCHC: 30.2 g/dL (ref 30.0–36.0)
MCV: 87.7 fL (ref 80.0–100.0)
Monocytes Absolute: 0.8 10*3/uL (ref 0.1–1.0)
Monocytes Relative: 7 %
Neutro Abs: 9.2 10*3/uL — ABNORMAL HIGH (ref 1.7–7.7)
Neutrophils Relative %: 82 %
Platelets: 259 10*3/uL (ref 150–400)
RBC: 4.87 MIL/uL (ref 4.22–5.81)
RDW: 17.1 % — ABNORMAL HIGH (ref 11.5–15.5)
WBC: 11.3 10*3/uL — ABNORMAL HIGH (ref 4.0–10.5)
nRBC: 0 % (ref 0.0–0.2)

## 2018-11-06 LAB — BASIC METABOLIC PANEL
Anion gap: 15 (ref 5–15)
BUN: 39 mg/dL — ABNORMAL HIGH (ref 8–23)
CO2: 40 mmol/L — ABNORMAL HIGH (ref 22–32)
Calcium: 9.4 mg/dL (ref 8.9–10.3)
Chloride: 87 mmol/L — ABNORMAL LOW (ref 98–111)
Creatinine, Ser: 1.33 mg/dL — ABNORMAL HIGH (ref 0.61–1.24)
GFR calc Af Amer: >60 mL/min (ref >60)
GFR calc non Af Amer: 52 mL/min — ABNORMAL LOW (ref >60)
Glucose, Bld: 106 mg/dL — ABNORMAL HIGH (ref 70–99)
Potassium: 4.2 mmol/L (ref 3.5–5.1)
Sodium: 142 mmol/L (ref 135–145)

## 2018-11-06 LAB — GLUCOSE, CAPILLARY
Glucose-Capillary: 108 mg/dL — ABNORMAL HIGH (ref 70–99)
Glucose-Capillary: 111 mg/dL — ABNORMAL HIGH (ref 70–99)
Glucose-Capillary: 114 mg/dL — ABNORMAL HIGH (ref 70–99)
Glucose-Capillary: 130 mg/dL — ABNORMAL HIGH (ref 70–99)
Glucose-Capillary: 149 mg/dL — ABNORMAL HIGH (ref 70–99)
Glucose-Capillary: 99 mg/dL (ref 70–99)

## 2018-11-06 MED ORDER — MIDODRINE HCL 5 MG PO TABS
5.0000 mg | ORAL_TABLET | Freq: Three times a day (TID) | ORAL | Status: DC
  Filled 2018-11-06 (×2): qty 1

## 2018-11-06 MED ORDER — IPRATROPIUM-ALBUTEROL 0.5-2.5 (3) MG/3ML IN SOLN
3.0000 mL | Freq: Four times a day (QID) | RESPIRATORY_TRACT | Status: DC
  Administered 2018-11-06 – 2018-11-09 (×13): 3 mL via RESPIRATORY_TRACT
  Filled 2018-11-06 (×12): qty 3

## 2018-11-06 MED ORDER — MIDODRINE HCL 5 MG PO TABS
5.0000 mg | ORAL_TABLET | Freq: Three times a day (TID) | ORAL | Status: DC
  Administered 2018-11-06 (×2): 5 mg via ORAL
  Filled 2018-11-06 (×6): qty 1

## 2018-11-06 NOTE — Progress Notes (Signed)
CRITICAL CARE NOTE       SUBJECTIVE FINDINGS & SIGNIFICANT EVENTS   Patient remains critically ill Prognosis is guarded He has periods of confusion possibly due to critical care associated delerium vs hypoxemic encephalopathy. Had phone conference with family today.     PAST MEDICAL HISTORY   Past Medical History:  Diagnosis Date  . Asthma   . CAD (coronary artery disease)   . COPD (chronic obstructive pulmonary disease) ([place])   . Depression   . Diabetes mellitus without complication ([place])   . Difficult intubation   . GERD (gastroesophageal reflux disease)   . Hypertension   . Lung cancer ([place])   . 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 Pancreatic mass   . PTSD (post-traumatic stress disorder)   . Sleep apnea    CPAP with O2 2L/m at night     SURGICAL HISTORY   Past Surgical History:  Procedure Laterality Date  . HERNIA REPAIR Right    right inguinal hernia repair  . KNEE SURGERY Right    right knee repair  . UPPER ESOPHAGEAL ENDOSCOPIC ULTRASOUND (EUS) N/A 09/20/2015   Procedure: UPPER ESOPHAGEAL ENDOSCOPIC ULTRASOUND (EUS);  Surgeon: [name], MD;  Location: [facility] ENDOSCOPY;  Service: Endoscopy;  Laterality: N/A;     FAMILY HISTORY   Family History  Problem Relation Age of Onset  . Hypertension Mother   . Congestive Heart Failure Father      SOCIAL HISTORY   Social History   Tobacco Use  . Smoking status: Former Research scientist (life sciences)  . Smokeless tobacco: Never Used  Substance Use Topics  . Alcohol use: Not on file  . Drug use: Not on file     MEDICATIONS   Current Medication:  Current Facility-Administered Medications:  .  0.9 %  sodium chloride infusion, 250 mL, Intravenous, PRN, Seals, Levada Dy H, NP .  acetylcysteine (MUCOMYST) 20 % nebulizer / oral solution 4  mL, 4 mL, Nebulization, BID, Lanney Gins, Eris Breck, MD, 4 mL at 11/05/18 1913 .  aspirin chewable tablet 81 mg, 81 mg, Oral, Daily, Tukov-Yual, Magdalene S, NP, 81 mg at 11/05/18 0918 .  bisacodyl (DULCOLAX) suppository 10 mg, 10 mg, Rectal, Daily PRN, Dove, Tasha A, NP .  budesonide (PULMICORT) nebulizer solution 0.5 mg, 0.5 mg, Nebulization, Q12H, Blakeney, Dana G, NP, 0.5 mg at 11/05/18 1913 .  cefTRIAXone (ROCEPHIN) 1 g in sodium chloride 0.9 % 100 mL IVPB, 1 g, Intravenous, Q24H, Ottie Glazier, MD, Stopped at 11/05/18 1816 .  dexmedetomidine (PRECEDEX) 400 MCG/100ML (4 mcg/mL) infusion, 0.4-1.2 mcg/kg/hr, Intravenous, Titrated, Blakeney, Dana G, NP, Last Rate: 7.48 mL/hr at 11/06/18 0600, 0.4 mcg/kg/hr at 11/06/18 0600 .  diltiazem (CARDIZEM CD) 24 hr capsule 180 mg, 180 mg, Oral, Daily, Tukov-Yual, Magdalene S, NP, 180 mg at 11/05/18 0918 .  enoxaparin (LOVENOX) injection 40 mg, 40 mg, Subcutaneous, Q24H, Maricus [name], MD, 40 mg at 11/06/18 0140 .  furosemide (LASIX) injection 40 mg, 40 mg, Intravenous, BID, Lanney Gins, Tannie Koskela, MD, 40 mg at 11/05/18 1703 .  glycopyrrolate (ROBINUL) tablet 1 mg, 1 mg, Oral, BID, Raevin Wierenga, MD, 1 mg at 11/04/18 2113 .  hydrOXYzine (ATARAX/VISTARIL) tablet 10 mg, 10 mg, Oral, QHS, Tukov-Yual, Magdalene S, NP, 10 mg at 11/04/18 2113 .  insulin aspart (novoLOG) injection 0-15 Units, 0-15 Units, Subcutaneous, Q4H, Tukov-Yual, Magdalene S, NP, 3 Units at 11/05/18 2000 .  insulin detemir (LEVEMIR) injection 7 Units, 7 Units, Subcutaneous, Daily, Ottie Glazier, MD, 7 Units at 11/05/18 0917 .  ipratropium-albuterol (DUONEB) 0.5-2.5 (3) MG/3ML nebulizer solution 3 mL, 3 mL, Nebulization, Q4H, Phoenicia Pirie, MD, 3 mL at 11/06/18 0000 .  LORazepam (ATIVAN) tablet 1-2 mg, 1-2 mg, Oral, Q6H PRN, Dove, Tasha A, NP, 2 mg at 11/05/18 1000 .  morphine CONCENTRATE 10 MG/0.5ML oral solution 2.6-5 mg, 2.6-5 mg, Sublingual, Q4H, Tukov-Yual, Magdalene S, NP, 2.6 mg at 11/06/18  0412 .  nitroGLYCERIN 50 mg in dextrose 5 % 250 mL (0.2 mg/mL) infusion, 0-200 mcg/min, Intravenous, Continuous, Nance Pear, MD, Stopped at 11/01/18 1823 .  pantoprazole (PROTONIX) EC tablet 40 mg, 40 mg, Oral, Daily, Paticia Stack, RPH, 40 mg at 11/05/18 0867 .  predniSONE (DELTASONE) tablet 20 mg, 20 mg, Oral, Q breakfast, Netanel Yannuzzi, MD, 20 mg at 11/05/18 0916 .  QUEtiapine (SEROQUEL) tablet 100 mg, 100 mg, Oral, QHS, Tukov-Yual, Magdalene S, NP, 100 mg at 11/04/18 2113 .  senna-docusate (Senokot-S) tablet 2 tablet, 2 tablet, Oral, BID, Dove, Tasha A, NP, 2 tablet at 11/05/18 0916 .  sodium chloride flush (NS) 0.9 % injection 3 mL, 3 mL, Intravenous, Q12H, Seals, Angela H, NP, 3 mL at 11/05/18 2200 .  sodium chloride flush (NS) 0.9 % injection 3 mL, 3 mL, Intravenous, PRN, Seals, Angela H, NP .  tamsulosin (FLOMAX) capsule 0.4 mg, 0.4 mg, Oral, QPC breakfast, Tukov-Yual, Magdalene S, NP, 0.4 mg at 11/05/18 0916 .  theophylline (THEO-24) 24 hr capsule 300 mg, 300 mg, Oral, BID, Tukov-Yual, Magdalene S, NP, 300 mg at 11/05/18 0917 .  traZODone (DESYREL) tablet 100 mg, 100 mg, Oral, QHS, Tukov-Yual, Magdalene S, NP, 100 mg at 11/04/18 2113    ALLERGIES   Ampicillin; Lorazepam; Penicillins; and Zyban [bupropion]    REVIEW OF SYSTEMS   10 point ROS unable to obtain due to critical illness  PHYSICAL EXAMINATION   Vitals:   11/06/18 0500 11/06/18 0600  BP: 91/63 (!) 75/47  Pulse:  73  Resp:  19  Temp:    SpO2:  94%    GENERAL:mildly encephalopathic HEAD: Normocephalic, atraumatic.  EYES: Pupils equal, round, reactive to light.  No scleral icterus.  MOUTH: Moist mucosal membrane. NECK: Supple. No thyromegaly. No nodules. No JVD.  PULMONARY: Bilateral rhochorous breath sounds CARDIOVASCULAR: S1 and S2. Regular rate and rhythm. No murmurs, rubs, or gallops.  GASTROINTESTINAL: Soft, nontender, non-distended. No masses. Positive bowel sounds. No hepatosplenomegaly.   MUSCULOSKELETAL: No swelling, clubbing, or edema.  NEUROLOGIC: Mild distress due to acute illness SKIN:intact,warm,dry   LABS AND IMAGING      LAB RESULTS: Recent Labs  Lab 11/04/18 0447 11/05/18 0329 11/06/18 0430  NA 139 138 142  K 3.5 4.0 4.2  CL 89* 88* 87*  CO2 40* 39* 40*  BUN 32* 33* 39*  CREATININE 1.53* 1.48* 1.33*  GLUCOSE 163* 102* 106*   Recent Labs  Lab 11/04/18 0447 11/05/18 0329 11/06/18 0430  HGB 12.9* 13.7 12.9*  HCT 42.3 44.9 42.7  WBC 12.8* 14.8* 11.3*  PLT 277 298 259     IMAGING RESULTS: No results found.    ASSESSMENT AND PLAN   -Multidisciplinary rounds held today   Acuteon ChronicHypoxic Respiratory Failure -likely due to post obstructive right lung pneumonia in context of lung cancer.  -noncompliance with BIPAP in ICU -woking with [employer] to get vest therapy at home, and home health is being set up -Currently on regimen for community-acquired pneumonia with Zithromax and Rocephin  -Oncology on case - appreciate input- no furhter treatment at this time -palliative care consultation- Goals  of care -mild-mod pleural effusion with mild LLL atelectasis contributing to respiratory compromise.  - will place on METAneb for BPH-Mucomyst 20% twice daily  -Continue withprednisone  - will add robinul po for thickened high volume airway secretions  -Daily productive cough for at least 6 months continuous. Frequent (more than 2/yr) exacerbations/chest infections requiring antibiotic therapy.  Failed PEP therapy and failed breathing/drainage techniques    OSA  - will place on CPAP nocturnally     Accelerated hypertension - resolved s/p cardizem drip   ID -continue IV abx as prescibed -follow up cultures   GI/Nutrition GI PROPHYLAXIS as indicated DIET-->TF's as tolerated Constipation protocol as indicated   ENDO - ICU hypoglycemic\Hyperglycemia protocol -check FSBS per  protocol   ELECTROLYTES -follow labs as needed -replace as needed -pharmacy consultation   DVT/GI PRX ordered -SCDs  TRANSFUSIONS AS NEEDED MONITOR FSBS ASSESS the need for LABS as needed    Critical care provider statement:  Critical care time (minutes):34 Critical care time was exclusive of: Separately billable procedures and treating other patients Critical care was necessary to treat or prevent imminent or life-threatening deterioration of the following conditions:Acute hypoxemic respiratory failure, severe COPD, OSA, multiple comorbid conditions Critical care was time spent personally by me on the following activities: Development of treatment plan with patient or surrogate, discussions with consultants, evaluation of patient's response to treatment, examination of patient, obtaining history from patient or surrogate, ordering and performing treatments and interventions, ordering and review of laboratory studies and re-evaluation of patient's condition. I assumed direction of critical care for this patient from another provider in my specialty: no     This document was prepared using Dragon voice recognition software and may include unintentional dictation errors.    Ottie Glazier, M.D.  Division of [place]

## 2018-11-06 NOTE — Progress Notes (Signed)
[place] [facility] [place] NAME: Earl Clay    MR#:  [id number]  DATE OF BIRTH:  1940/08/17    CHIEF COMPLAINT:   Chief Complaint  Patient presents with  . Respiratory Distress   Poor historian. HFNC  Getting chest PT  REVIEW OF SYSTEMS:   Review of Systems  Unable to perform ROS: Mental status change   DRUG ALLERGIES:   Allergies  Allergen Reactions  . Ampicillin Hives and Other (See Comments)    Has patient had a PCN reaction causing immediate rash, facial/tongue/throat swelling, SOB or lightheadedness with hypotension: No Has patient had a PCN reaction causing severe rash involving mucus membranes or skin necrosis: No Has patient had a PCN reaction that required hospitalization No Has patient had a PCN reaction occurring within the last 10 years: No If all of the above answers are "NO", then may proceed with Cephalosporin use.  . Lorazepam     Worsening confusion  . Penicillins Hives and Other (See Comments)    Has patient had a PCN reaction causing immediate rash, facial/tongue/throat swelling, SOB or lightheadedness with hypotension: No Has patient had a PCN reaction causing severe rash involving mucus membranes or skin necrosis: No Has patient had a PCN reaction that required hospitalization No Has patient had a PCN reaction occurring within the last 10 years: No If all of the above answers are "NO", then may proceed with Cephalosporin use.  . Zyban [Bupropion] Rash    VITALS:  Blood pressure (!) 82/65, pulse 81, temperature 97.7 F (36.5 C), temperature source Oral, resp. rate (!) 21, height 5\' 6"  (1.676 m), weight 74.8 kg, SpO2 96 %.  PHYSICAL EXAMINATION:  GENERAL:  78 y.o.-year-old patient lying in the bed .  EYES: Pupils equal, round, reactive to light and accommodation. No scleral icterus. Extraocular muscles intact.  HEENT: Head atraumatic, normocephalic. Oropharynx and nasopharynx clear.  NECK:   Supple, no jugular venous distention. No thyroid enlargement, no tenderness.  LUNGS: Diminished air entry bilaterally.Marland Kitchen   CARDIOVASCULAR: S1, S2 normal. No murmurs, rubs, or gallops.  ABDOMEN: Soft, nontender, nondistended. Bowel sounds present. No organomegaly or mass.  EXTREMITIES: No pedal edema, cyanosis, or clubbing.  NEUROLOGIC: Moves all 4 extremities PSYCHIATRIC: The patient is pleasantly confused SKIN: No obvious rash, lesion, or ulcer.   LABORATORY PANEL:   CBC Recent Labs  Lab 11/06/18 0430  WBC 11.3*  HGB 12.9*  HCT 42.7  PLT 259   ------------------------------------------------------------------------------------------------------------------  Chemistries  Recent Labs  Lab 11/04/18 0447  11/06/18 0430  NA 139   < > 142  K 3.5   < > 4.2  CL 89*   < > 87*  CO2 40*   < > 40*  GLUCOSE 163*   < > 106*  BUN 32*   < > 39*  CREATININE 1.53*   < > 1.33*  CALCIUM 9.2   < > 9.4  MG 2.3  --   --    < > = values in this interval not displayed.   ------------------------------------------------------------------------------------------------------------------  Cardiac Enzymes Recent Labs  Lab 10/31/18 1808  TROPONINI 0.08*   ------------------------------------------------------------------------------------------------------------------  RADIOLOGY:  Dg Chest Port 1 View  Result Date: 11/05/2018 CLINICAL DATA:  78 year old male with shortness of breath. Lung cancer and COPD on home BiPAP. EXAM: PORTABLE CHEST 1 VIEW COMPARISON:  10/31/2018 and earlier. FINDINGS: Portable AP semi upright view at 0248 hours. Lower lung volumes. Stable cardiac size and mediastinal contours. Right suprahilar masslike  opacity redemonstrated. No pneumothorax. Pulmonary vascularity appears stable. Lung base ventilation now appears symmetric. Negative visible bowel gas pattern. No acute osseous abnormality identified. IMPRESSION: Lower lung volumes but no new cardiopulmonary abnormality when  compared to the CT on 10/26/2018 (please also see that report). Electronically Signed   By: Genevie Ann M.D.   On: 11/05/2018 03:32    EKG:   Orders placed or performed during the hospital encounter of 10/31/18  . ED EKG  . ED EKG  . EKG 12-Lead  . EKG 12-Lead    ASSESSMENT AND PLAN:   1. Acute respiratory failure on chronic respiratory failure with hypoxia secondary to community-acquired pneumonia. He does have poor baseline pulmonary function secondary to his COPD and lung cancer.  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 Rogue Bussing.    2.  COPD exacerbation, continue oxygen, steroids.  Continue bronchodilators with Pulmicort, DuoNeb's.  4. Hypokalemia: Replaced.  5.  Acute renal failure - resolved  Overall prognosis poor due to underlying lung cancer, advanced age. Appropriate for hospice care  All the records are reviewed and case discussed with Care Management/Social Worker Management plans discussed with the patient, family and they are in agreement.  CODE STATUS: DNR  TOTAL TIME TAKING CARE OF THIS PATIENT: 35 minutes.   Leia Alf Abigial Newville M.D on 11/06/2018 at 9:35 AM  Between 7am to 6pm - Pager - [phone]  After 6pm go to [email or web address] - password [email or web address] [facility]  Office  [phone]  CC: Primary care physician; [name], MD   Note: This dictation was prepared with Dragon dictation along with smaller phrase technology. Any transcriptional errors that result from this process are unintentional.

## 2018-11-06 NOTE — Progress Notes (Signed)
Placed on BIPAP per RRT. FIO2 @100 %.

## 2018-11-06 NOTE — Progress Notes (Signed)
Notified Josh from case management regarding possible home hospice. Per Pt and family request.

## 2018-11-06 NOTE — Progress Notes (Signed)
PHARMACY CONSULT NOTE - FOLLOW UP  Pharmacy Consult for Electrolyte Monitoring and Replacement   Recent Labs: Potassium (mmol/L)  Date Value  11/06/2018 4.2  05/19/2014 4.4   Magnesium (mg/dL)  Date Value  11/04/2018 2.3  05/16/2014 1.9   Calcium (mg/dL)  Date Value  11/06/2018 9.4   Calcium, Total (mg/dL)  Date Value  05/19/2014 8.3 (L)   Albumin (g/dL)  Date Value  08/08/2018 3.6  05/16/2014 3.9   Phosphorus (mg/dL)  Date Value  11/04/2018 2.8   Sodium (mmol/L)  Date Value  11/06/2018 142  05/19/2014 139     Assessment: Patient admitted for CAP/COPD exacerbation. PMH significant for CHF, CKD III.  Goal of Therapy:  Potassium ~4.0, Magnesium ~2.0.  Plan:  Patient is receiving furosemide 40 mg IV BID.  Electrolytes WNL. Will order electrolytes with AM labs.  Pharmacy will continue to monitor and adjust per consult.   Pernell Dupre, PharmD, BCPS Clinical Pharmacist 11/06/2018 5:34 AM

## 2018-11-06 NOTE — Progress Notes (Signed)
Progress Note - Critical Care     Had phone conference regarding goals of care.  Spoke with  [place] had questions regarding level of care he would require if he came home.  They wanted to discuss hospice at home.  I have answered all questions.  I had recommended to discuss options with CM.     Ottie Glazier, M.D.  Pulmonary & [place]

## 2018-11-06 NOTE — Progress Notes (Signed)
SPO2@ of 84%, denies SOB, lung sounds diminished BIL. RTT notified and at bedside, administering breathing treatment. Will continue to monitor.

## 2018-11-06 NOTE — Progress Notes (Signed)
Assisted tele visit to patient with daughter.  Lenox Ahr, RN

## 2018-11-07 LAB — GLUCOSE, CAPILLARY
Glucose-Capillary: 106 mg/dL — ABNORMAL HIGH (ref 70–99)
Glucose-Capillary: 126 mg/dL — ABNORMAL HIGH (ref 70–99)
Glucose-Capillary: 135 mg/dL — ABNORMAL HIGH (ref 70–99)
Glucose-Capillary: 159 mg/dL — ABNORMAL HIGH (ref 70–99)
Glucose-Capillary: 61 mg/dL — ABNORMAL LOW (ref 70–99)
Glucose-Capillary: 70 mg/dL (ref 70–99)
Glucose-Capillary: 95 mg/dL (ref 70–99)
Glucose-Capillary: 97 mg/dL (ref 70–99)

## 2018-11-07 LAB — BASIC METABOLIC PANEL
Anion gap: 15 (ref 5–15)
BUN: 45 mg/dL — ABNORMAL HIGH (ref 8–23)
CO2: 39 mmol/L — ABNORMAL HIGH (ref 22–32)
Calcium: 9.3 mg/dL (ref 8.9–10.3)
Chloride: 86 mmol/L — ABNORMAL LOW (ref 98–111)
Creatinine, Ser: 1.19 mg/dL (ref 0.61–1.24)
GFR calc Af Amer: >60 mL/min (ref >60)
GFR calc non Af Amer: 59 mL/min — ABNORMAL LOW (ref >60)
Glucose, Bld: 93 mg/dL (ref 70–99)
Potassium: 3.6 mmol/L (ref 3.5–5.1)
Sodium: 140 mmol/L (ref 135–145)

## 2018-11-07 LAB — MAGNESIUM: Magnesium: 2.7 mg/dL — ABNORMAL HIGH (ref 1.7–2.4)

## 2018-11-07 MED ORDER — MIDODRINE HCL 5 MG PO TABS
5.0000 mg | ORAL_TABLET | Freq: Three times a day (TID) | ORAL | Status: DC | PRN
  Filled 2018-11-07 (×3): qty 1

## 2018-11-07 MED ORDER — POTASSIUM CHLORIDE CRYS ER 20 MEQ PO TBCR
20.0000 meq | EXTENDED_RELEASE_TABLET | Freq: Once | ORAL | Status: AC
  Administered 2018-11-07: 20 meq via ORAL
  Filled 2018-11-07: qty 1

## 2018-11-07 MED ORDER — DEXTROSE 50 % IV SOLN
12.5000 g | Freq: Once | INTRAVENOUS | Status: AC
  Administered 2018-11-07: 12.5 g via INTRAVENOUS

## 2018-11-07 MED ORDER — DEXTROSE 50 % IV SOLN
INTRAVENOUS | Status: AC
  Filled 2018-11-07: qty 50

## 2018-11-07 NOTE — Progress Notes (Signed)
Resting in bed, on BIPAP.  Disoriented to time. Denies pain, SOB, N/V at this time.

## 2018-11-07 NOTE — Progress Notes (Signed)
CRITICAL CARE NOTE        SUBJECTIVE FINDINGS & SIGNIFICANT EVENTS   Patient remains critically ill , Prognosis is guarded  Chronically ill patient with end-stage COPD as well as lung cancer that is not amenable to additional treatment status post chemo and radiation 2015 and now dependent on BiPAP/HFNC >10L -plan for home with hospice. Discussed with family and patient and CM, arranged with [employer] for Aflo vest to home, pt has trilogy bipap device at home.   PAST MEDICAL HISTORY   Past Medical History:  Diagnosis Date  . Asthma   . CAD (coronary artery disease)   . COPD (chronic obstructive pulmonary disease) ([place])   . Depression   . Diabetes mellitus without complication ([place])   . Difficult intubation   . GERD (gastroesophageal reflux disease)   . Hypertension   . Lung cancer ([place])   . 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 Pancreatic mass   . PTSD (post-traumatic stress disorder)   . Sleep apnea    CPAP with O2 2L/m at night     SURGICAL HISTORY   Past Surgical History:  Procedure Laterality Date  . HERNIA REPAIR Right    right inguinal hernia repair  . KNEE SURGERY Right    right knee repair  . UPPER ESOPHAGEAL ENDOSCOPIC ULTRASOUND (EUS) N/A 09/20/2015   Procedure: UPPER ESOPHAGEAL ENDOSCOPIC ULTRASOUND (EUS);  Surgeon: [name], MD;  Location: [facility] ENDOSCOPY;  Service: Endoscopy;  Laterality: N/A;     FAMILY HISTORY   Family History  Problem Relation Age of Onset  . Hypertension Mother   . Congestive Heart Failure Father      SOCIAL HISTORY   Social History   Tobacco Use  . Smoking status: Former Research scientist (life sciences)  . Smokeless tobacco: Never Used  Substance Use Topics  . Alcohol use: Not on file  . Drug use: Not on file     MEDICATIONS   Current Medication:   Current Facility-Administered Medications:  .  0.9 %  sodium chloride infusion, 250 mL, Intravenous, PRN, Seals, Levada Dy H, NP .  acetylcysteine (MUCOMYST) 20 % nebulizer / oral solution 4 mL, 4 mL, Nebulization, BID, Lanney Gins, Danaija Eskridge, MD, 4 mL at 11/07/18 0751 .  aspirin chewable tablet 81 mg, 81 mg, Oral, Daily, Tukov-Yual, Magdalene S, NP, 81 mg at 11/06/18 1028 .  bisacodyl (DULCOLAX) suppository 10 mg, 10 mg, Rectal, Daily PRN, Dove, Tasha A, NP .  budesonide (PULMICORT) nebulizer solution 0.5 mg, 0.5 mg, Nebulization, Q12H, Blakeney, Dana G, NP, 0.5 mg at 11/07/18 0751 .  cefTRIAXone (ROCEPHIN) 1 g in sodium chloride 0.9 % 100 mL IVPB, 1 g, Intravenous, Q24H, Bertie Mcconathy, MD, Last Rate: 200 mL/hr at 11/06/18 1709, 1 g at 11/06/18 1709 .  enoxaparin (LOVENOX) injection 40 mg, 40 mg, Subcutaneous, Q24H, Lanney Gins, Cardin Nitschke, MD, 40 mg at 11/06/18 2102 .  glycopyrrolate (ROBINUL) tablet 1 mg, 1 mg, Oral, BID, Ottie Glazier, MD, 1 mg at 11/06/18 2153 .  hydrOXYzine (ATARAX/VISTARIL) tablet 10 mg, 10 mg, Oral, QHS, Tukov-Yual, Magdalene S, NP, 10 mg at 11/06/18 2102 .  insulin aspart (novoLOG) injection 0-15 Units, 0-15 Units, Subcutaneous, Q4H, Tukov-Yual, Magdalene S, NP, 2 Units at 11/07/18 0021 .  insulin detemir (LEVEMIR) injection 7 Units, 7 Units, Subcutaneous, Daily, Ottie Glazier, MD, 7 Units at 11/07/18 0907 .  ipratropium-albuterol (DUONEB) 0.5-2.5 (3) MG/3ML nebulizer solution 3 mL, 3 mL, Nebulization, Q6H, Sudini, Srikar, MD, 3 mL at 11/07/18 0751 .  LORazepam (ATIVAN) tablet  1-2 mg, 1-2 mg, Oral, Q6H PRN, Dove, Tasha A, NP, 2 mg at 11/06/18 2101 .  midodrine (PROAMATINE) tablet 5 mg, 5 mg, Oral, TID WC, Sudini, Srikar, MD, 5 mg at 11/06/18 1615 .  nitroGLYCERIN 50 mg in dextrose 5 % 250 mL (0.2 mg/mL) infusion, 0-200 mcg/min, Intravenous, Continuous, Nance Pear, MD, Stopped at 11/01/18 1823 .  pantoprazole (PROTONIX) EC tablet 40 mg, 40 mg, Oral, Daily, Paticia Stack, RPH,  40 mg at 11/06/18 1028 .  predniSONE (DELTASONE) tablet 20 mg, 20 mg, Oral, Q breakfast, Stormi Vandevelde, MD, 20 mg at 11/05/18 0916 .  QUEtiapine (SEROQUEL) tablet 100 mg, 100 mg, Oral, QHS, Tukov-Yual, Magdalene S, NP, 100 mg at 11/06/18 2102 .  senna-docusate (Senokot-S) tablet 2 tablet, 2 tablet, Oral, BID, Dove, Tasha A, NP, 2 tablet at 11/06/18 2110 .  sodium chloride flush (NS) 0.9 % injection 3 mL, 3 mL, Intravenous, Q12H, Seals, Angela H, NP, 3 mL at 11/07/18 0910 .  sodium chloride flush (NS) 0.9 % injection 3 mL, 3 mL, Intravenous, PRN, Seals, Angela H, NP .  tamsulosin (FLOMAX) capsule 0.4 mg, 0.4 mg, Oral, QPC breakfast, Tukov-Yual, Magdalene S, NP, 0.4 mg at 11/05/18 0916 .  theophylline (THEO-24) 24 hr capsule 300 mg, 300 mg, Oral, BID, Tukov-Yual, Magdalene S, NP, 300 mg at 11/06/18 1035 .  traZODone (DESYREL) tablet 100 mg, 100 mg, Oral, QHS, Tukov-Yual, Magdalene S, NP, 100 mg at 11/06/18 2102    ALLERGIES   Ampicillin; Lorazepam; Penicillins; and Zyban [bupropion]    REVIEW OF SYSTEMS   10 point ROS conducted and is negative except as per subjective findings  PHYSICAL EXAMINATION   Vitals:   11/07/18 0900 11/07/18 1000  BP: 100/67 (!) 95/58  Pulse: (!) 101 99  Resp: 18 (!) 22  Temp:    SpO2: 91% 92%    GENERAL: Mild distress due to respiratory failure HEAD: Normocephalic, atraumatic.  EYES: Pupils equal, round, reactive to light.  No scleral icterus.  MOUTH: Moist mucosal membrane. NECK: Supple. No thyromegaly. No nodules. No JVD.  PULMONARY: Bilateral rhonchorous breath sounds CARDIOVASCULAR: S1 and S2. Regular rate and rhythm. No murmurs, rubs, or gallops.  GASTROINTESTINAL: Soft, nontender, non-distended. No masses. Positive bowel sounds. No hepatosplenomegaly.  MUSCULOSKELETAL: No swelling, clubbing, or edema.  NEUROLOGIC: Mild distress due to acute illness SKIN:intact,warm,dry   LABS AND IMAGING     LAB RESULTS: Recent Labs  Lab 11/05/18  0329 11/06/18 0430 11/07/18 0518  NA 138 142 140  K 4.0 4.2 3.6  CL 88* 87* 86*  CO2 39* 40* 39*  BUN 33* 39* 45*  CREATININE 1.48* 1.33* 1.19  GLUCOSE 102* 106* 93   Recent Labs  Lab 11/04/18 0447 11/05/18 0329 11/06/18 0430  HGB 12.9* 13.7 12.9*  HCT 42.3 44.9 42.7  WBC 12.8* 14.8* 11.3*  PLT 277 298 259     IMAGING RESULTS: No results found.    ASSESSMENT AND PLAN    Acuteon ChronicHypoxic Respiratory Failure -likely due to post obstructive right lung pneumonia in context of lung cancer.  -Periods of noncompliance with BIPAP in ICU -Plan for home with hospice Monday, Nov 08, 2018 -woking with [employer] to get vest therapy at home, and home health is being set up -Currently on regimen for community-acquired pneumonia with Zithromax and Rocephin  -Oncology on case - appreciate input- no furhter treatment at this time -palliative care consultation- Goals of care -mild-mod pleural effusion with mild LLL atelectasis contributing to respiratory compromise.  -  METAneb for BPH-will DC Mucomyst  -Continue withprednisone - will add robinul po for thickened high volume airway secretions    Aflo vest is needed at home for indications as below:  -Daily productive cough for at least 6 months continuous. Frequent (more than 2/yr) exacerbations/chest infections requiring antibiotic therapy.  Failed PEP therapy and failed breathing/drainage techniques    OSA  - will place on CPAP nocturnally, pt has device at home    Accelerated hypertension - resolved s/p cardizem drip   ID -continue IV abx as prescibed -follow up cultures   GI/Nutrition GI PROPHYLAXIS as indicated DIET-->TF's as tolerated Constipation protocol as indicated   ENDO - ICU hypoglycemic\Hyperglycemia protocol -check FSBS per protocol   ELECTROLYTES -follow labs as needed -replace as needed -pharmacy consultation   DVT/GI PRX ordered  -SCDs  TRANSFUSIONS AS NEEDED MONITOR FSBS ASSESS the need for LABS as needed    Critical care provider statement:  Critical care time (minutes):34 Critical care time was exclusive of: Separately billable procedures and treating other patients Critical care was necessary to treat or prevent imminent or life-threatening deterioration of the following conditions:Acute hypoxemic respiratory failure, severe COPD, OSA, multiple comorbid conditions Critical care was time spent personally by me on the following activities: Development of treatment plan with patient or surrogate, discussions with consultants, evaluation of patient's response to treatment, examination of patient, obtaining history from patient or surrogate, ordering and performing treatments and interventions, ordering and review of laboratory studies and re-evaluation of patient's condition. I assumed direction of critical care for this patient from another provider in my specialty: no     This document was prepared using Dragon voice recognition software and may include unintentional dictation errors.    Ottie Glazier, M.D.  Division of [facility]

## 2018-11-07 NOTE — Progress Notes (Signed)
PHARMACY CONSULT NOTE - FOLLOW UP  Pharmacy Consult for Electrolyte Monitoring and Replacement   Recent Labs: Potassium (mmol/L)  Date Value  11/07/2018 3.6  05/19/2014 4.4   Magnesium (mg/dL)  Date Value  11/07/2018 2.7 (H)  05/16/2014 1.9   Calcium (mg/dL)  Date Value  11/07/2018 9.3   Calcium, Total (mg/dL)  Date Value  05/19/2014 8.3 (L)   Albumin (g/dL)  Date Value  08/08/2018 3.6  05/16/2014 3.9   Phosphorus (mg/dL)  Date Value  11/04/2018 2.8   Sodium (mmol/L)  Date Value  11/07/2018 140  05/19/2014 139     Assessment: Patient admitted for CAP/COPD exacerbation. PMH significant for CHF, CKD III.  Goal of Therapy:  Potassium ~4.0, Magnesium ~2.0.  Plan:  Furosemide 40 mg IV BID discontinued 5/2  Will order KCL 58mEq x 1 dose. Will recheck electrolytes with AM labs.  Pharmacy will continue to monitor and adjust per consult.   Pernell Dupre, PharmD, BCPS Clinical Pharmacist 11/07/2018 5:56 AM

## 2018-11-07 NOTE — Progress Notes (Signed)
Pt has stated several times this afternoon that he wants to go home. Spoke with pt's girlfriend via phone and she is understanding of the situation of pt going home with Hospice as early as tomorrow.

## 2018-11-07 NOTE — TOC Initial Note (Signed)
Transition of Care [facility]) - Initial/Assessment Note    Patient Details  Name: Earl Clay MRN: [id number] Date of Birth: 1940/11/04  Transition of Care [facility] CM/SW Contact:    Latanya Maudlin, RN Phone Number: 11/07/2018, 7:58 AM  Clinical Narrative:   TOC team was consulted for high risk re admissions as well as possible need for home with hospice care. RNCM revied a call from primary RN about looking into discharging home with hosipce as the Intesivist MD was able to speak with daughter Earl Clay regarding overall poor prognosis and high level of oxygen needs. Earl Clay discussed several things over the phone and their seemed to be conflicting information as to what she wished to pursue. We talked at length about what hospice was, what palliative care was, and also home with hospice vs hospice home. At this point Earl Clay says that she is "not ready for her father to give up, he still has a lot of fight in him". She is quite upset with the idea of home with hospice at this time since his O2 requirement is still HFNC or even continuous BIPAP. She tells me "you all cannot just send him home to die".  After discussions about all the options families ultimate preference is to keep treating his condition fully for "a few days" and then if no progression is made family will consider comfort measures/hospice care. She tells me it is difficult to make this decision without seeing her father but again, she feels it is too early to cease aggressive care but understands if he does not improve within the next few days she will speak with ICU MD/pailliatve team. Patient was already receiving outpatient palliative and was being worked up for [employer] vest via [place]. RNCM spoke with hospice admission RN just to make them aware and to anticipate a conversation shortly about the disposition between the family and palliative medicine team,. Hospice liasion Flo Shanks is aware of the admission.                  Expected Discharge Plan: Home w Hospice Care Barriers to Discharge: Continued Medical Work up   Patient Goals and CMS Choice        Expected Discharge Plan and Services Expected Discharge Plan: Home w Hospice Care   Discharge Planning Services: CM Consult                     DME Arranged: Other see comment(aflo vest)                    Prior Living Arrangements/Services   Lives with:: Significant Other              Current home services: Other (comment)(outpatient palliative)    Activities of Daily Living Home Assistive Devices/Equipment: None ADL Screening (condition at time of admission) Patient's cognitive ability adequate to safely complete daily activities?: Yes Is the patient deaf or have difficulty hearing?: No Does the patient have difficulty seeing, even when wearing glasses/contacts?: No Does the patient have difficulty concentrating, remembering, or making decisions?: No Patient able to express need for assistance with ADLs?: Yes Does the patient have difficulty dressing or bathing?: No Independently performs ADLs?: Yes (appropriate for developmental age) Does the patient have difficulty walking or climbing stairs?: No Weakness of Legs: None Weakness of Arms/Hands: None  Permission Sought/Granted                  Emotional Assessment  Admission diagnosis:  Lactic acidosis [E87.2] Acute respiratory failure, unspecified whether with hypoxia or hypercapnia ([place]) [J96.00] Hypertension, unspecified type [I10] Community acquired pneumonia, unspecified laterality [J18.9] Pneumonia [J18.9] Patient Active Problem List   Diagnosis Date Noted  . Encounter for hospice care discussion   . Acute respiratory failure ([place])   . Goals of care, counseling/discussion   . Palliative care by specialist   . PNA (pneumonia) 10/31/2018  . Community acquired pneumonia 10/31/2018  . Acute on chronic respiratory failure with hypoxemia  ([place]) 08/08/2018  . Lung nodule, solitary 08/15/2016  . 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 Malnutrition of moderate degree 09/09/2015  . COPD exacerbation ([place]) 09/09/2015  . ARF (acute renal failure) ([place]) 09/07/2015  . Type 2 diabetes mellitus ([place]) 07/20/2015  . Type 2 diabetes mellitus with hyperglycemia ([place]) 05/16/2014  . History of cardiac catheterization 01/24/2014  . Breathlessness on exertion 01/24/2014  . Chronic obstructive bronchitis ([place]) 11/17/2013  . CAD in native artery 11/17/2013  . Benign essential HTN 11/17/2013  . Combined fat and carbohydrate induced hyperlipemia 11/17/2013  . Hypersomnia with sleep apnea 11/17/2013  . 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 Pure hypercholesterolemia 11/17/2013  . 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 Elevated prostate specific antigen (PSA) 08/18/2012  . Disorder of bladder function 08/18/2012  . Enlarged prostate with lower urinary tract symptoms (LUTS) 08/18/2012  . Incomplete bladder emptying 08/18/2012  . Benign prostatic hyperplasia with urinary obstruction 08/18/2012  . FOM (frequency of micturition) 08/18/2012   PCP:  [name], MD Pharmacy:   [id number] - [place], [address] [address] Phone: [phone] Fax: [phone]     Social Determinants of Health (SDOH) Interventions    Readmission Risk Interventions Readmission Risk Prevention Plan 11/07/2018  SW Recovery Care/Counseling Consult Complete  Palliative Care Screening Complete  [place] Patient Refused  Some recent data might be hidden

## 2018-11-07 NOTE — Progress Notes (Signed)
Assisted tele visit to patient with daughter.  Lenox Ahr, RN

## 2018-11-07 NOTE — Progress Notes (Signed)
Family called to inquire about patient and asked several questions regarding oxygenation and vital signs. Was surprised patient was confused and said he was not all day. Patient was confused for nurse who gave this RN report, and had periods of confusion when this RN admitted him last Sunday. Will continue to monitor.

## 2018-11-07 NOTE — Progress Notes (Signed)
[place] [facility] [place] NAME: Earl Clay    MR#:  [id number]  DATE OF BIRTH:  Oct 02, 1940    CHIEF COMPLAINT:   Chief Complaint  Patient presents with  . Respiratory Distress   Placed back on BiPAP.  Awake but disoriented  REVIEW OF SYSTEMS:   Review of Systems  Unable to perform ROS: Mental status change   DRUG ALLERGIES:   Allergies  Allergen Reactions  . Ampicillin Hives and Other (See Comments)    Has patient had a PCN reaction causing immediate rash, facial/tongue/throat swelling, SOB or lightheadedness with hypotension: No Has patient had a PCN reaction causing severe rash involving mucus membranes or skin necrosis: No Has patient had a PCN reaction that required hospitalization No Has patient had a PCN reaction occurring within the last 10 years: No If all of the above answers are "NO", then may proceed with Cephalosporin use.  . Lorazepam     Worsening confusion  . Penicillins Hives and Other (See Comments)    Has patient had a PCN reaction causing immediate rash, facial/tongue/throat swelling, SOB or lightheadedness with hypotension: No Has patient had a PCN reaction causing severe rash involving mucus membranes or skin necrosis: No Has patient had a PCN reaction that required hospitalization No Has patient had a PCN reaction occurring within the last 10 years: No If all of the above answers are "NO", then may proceed with Cephalosporin use.  . Zyban [Bupropion] Rash    VITALS:  Blood pressure 113/68, pulse 99, temperature 97.8 F (36.6 C), temperature source Axillary, resp. rate 15, height 5\' 6"  (1.676 m), weight 74.8 kg, SpO2 98 %.  PHYSICAL EXAMINATION:  GENERAL:  78 y.o.-year-old patient lying in the bed .  Critically ill.  BiPAP in place. EYES: Pupils equal, round, reactive to light and accommodation. No scleral icterus. Extraocular muscles intact.  HEENT: Head atraumatic, normocephalic. Oropharynx  and nasopharynx clear.  NECK:  Supple, no jugular venous distention. No thyroid enlargement, no tenderness.  LUNGS: Diminished air entry bilaterally.Marland Kitchen   CARDIOVASCULAR: S1, S2 normal. No murmurs, rubs, or gallops.  ABDOMEN: Soft, nontender, nondistended. Bowel sounds present. No organomegaly or mass.  EXTREMITIES: No pedal edema, cyanosis, or clubbing.  NEUROLOGIC: Moves all 4 extremities PSYCHIATRIC: The patient is pleasantly confused SKIN: No obvious rash, lesion, or ulcer.   LABORATORY PANEL:   CBC Recent Labs  Lab 11/06/18 0430  WBC 11.3*  HGB 12.9*  HCT 42.7  PLT 259   ------------------------------------------------------------------------------------------------------------------  Chemistries  Recent Labs  Lab 11/07/18 0518  NA 140  K 3.6  CL 86*  CO2 39*  GLUCOSE 93  BUN 45*  CREATININE 1.19  CALCIUM 9.3  MG 2.7*   ------------------------------------------------------------------------------------------------------------------  Cardiac Enzymes Recent Labs  Lab 10/31/18 1808  TROPONINI 0.08*   ------------------------------------------------------------------------------------------------------------------  RADIOLOGY:  No results found.  EKG:   Orders placed or performed during the hospital encounter of 10/31/18  . ED EKG  . ED EKG  . EKG 12-Lead  . EKG 12-Lead    ASSESSMENT AND PLAN:   1. Acute respiratory failure on chronic respiratory failure with hypoxia secondary to community-acquired pneumonia. He does have poor baseline pulmonary function secondary to his COPD and lung cancer.  Alternating between BiPAP and high flow nasal cannula.  On BiPAP today  IV antibiotics with Rocephin, Zithromax COVID-19 test has been negative  2.  COPD exacerbation, continue oxygen, steroids.  Continue bronchodilators with Pulmicort, DuoNeb's.  4. Hypokalemia: Replaced.  5.  Acute renal failure - resolved  6. Lung cancer CT chest showed worsening of  left hilar cancer, patient is all already scheduled for PET scan with oncologist Dr. Rogue Bussing.    Overall prognosis poor due to underlying lung cancer, advanced age. Appropriate for hospice care  All the records are reviewed and case discussed with Care Management/Social Worker Management plans discussed with the patient, family and they are in agreement.  CODE STATUS: DNR  TOTAL TIME TAKING CARE OF THIS PATIENT: 35 minutes.   Leia Alf Katti Pelle M.D on 11/07/2018 at 10:15 AM  Between 7am to 6pm - Pager - [phone]  After 6pm go to [email or web address] - password [email or web address] [facility]  Office  [phone]  CC: Primary care physician; [name], MD   Note: This dictation was prepared with Dragon dictation along with smaller phrase technology. Any transcriptional errors that result from this process are unintentional.

## 2018-11-08 DIAGNOSIS — Z515 Encounter for palliative care: Secondary | ICD-10-CM

## 2018-11-08 DIAGNOSIS — Z7189 Other specified counseling: Secondary | ICD-10-CM

## 2018-11-08 DIAGNOSIS — R0602 Shortness of breath: Secondary | ICD-10-CM

## 2018-11-08 DIAGNOSIS — J9601 Acute respiratory failure with hypoxia: Secondary | ICD-10-CM

## 2018-11-08 DIAGNOSIS — J9602 Acute respiratory failure with hypercapnia: Secondary | ICD-10-CM

## 2018-11-08 LAB — BASIC METABOLIC PANEL
Anion gap: 14 (ref 5–15)
BUN: 33 mg/dL — ABNORMAL HIGH (ref 8–23)
CO2: 38 mmol/L — ABNORMAL HIGH (ref 22–32)
Calcium: 9.5 mg/dL (ref 8.9–10.3)
Chloride: 90 mmol/L — ABNORMAL LOW (ref 98–111)
Creatinine, Ser: 0.82 mg/dL (ref 0.61–1.24)
GFR calc Af Amer: >60 mL/min (ref >60)
GFR calc non Af Amer: >60 mL/min (ref >60)
Glucose, Bld: 143 mg/dL — ABNORMAL HIGH (ref 70–99)
Potassium: 3.2 mmol/L — ABNORMAL LOW (ref 3.5–5.1)
Sodium: 142 mmol/L (ref 135–145)

## 2018-11-08 LAB — GLUCOSE, CAPILLARY
Glucose-Capillary: 68 mg/dL — ABNORMAL LOW (ref 70–99)
Glucose-Capillary: 149 mg/dL — ABNORMAL HIGH (ref 70–99)
Glucose-Capillary: 140 mg/dL — ABNORMAL HIGH (ref 70–99)
Glucose-Capillary: 149 mg/dL — ABNORMAL HIGH (ref 70–99)
Glucose-Capillary: 204 mg/dL — ABNORMAL HIGH (ref 70–99)
Glucose-Capillary: 166 mg/dL — ABNORMAL HIGH (ref 70–99)

## 2018-11-08 MED ORDER — MORPHINE SULFATE 10 MG/5ML PO SOLN: 5.0000 mg | ORAL | Status: DC | PRN

## 2018-11-08 MED ORDER — POTASSIUM CHLORIDE 20 MEQ PO PACK: 40.0000 meq | PACK | ORAL | Status: DC

## 2018-11-08 MED ORDER — PANTOPRAZOLE SODIUM 40 MG IV SOLR
40.0000 mg | Freq: Every day | INTRAVENOUS | Status: DC
  Administered 2018-11-08 – 2018-11-09 (×2): 40 mg via INTRAVENOUS
  Filled 2018-11-08 (×2): qty 40

## 2018-11-08 MED ORDER — POTASSIUM CHLORIDE 10 MEQ/100ML IV SOLN
10.0000 meq | INTRAVENOUS | Status: AC
  Administered 2018-11-08 (×3): 10 meq via INTRAVENOUS
  Filled 2018-11-08 (×5): qty 100

## 2018-11-08 MED ORDER — MORPHINE SULFATE (PF) 2 MG/ML IV SOLN
2.0000 mg | INTRAVENOUS | Status: DC | PRN
  Administered 2018-11-08 – 2018-11-09 (×5): 2 mg via INTRAVENOUS
  Filled 2018-11-08 (×5): qty 1

## 2018-11-08 MED ORDER — HALOPERIDOL LACTATE 5 MG/ML IJ SOLN
0.5000 mg | Freq: Four times a day (QID) | INTRAMUSCULAR | Status: DC | PRN
  Administered 2018-11-08 (×2): 0.5 mg via INTRAVENOUS
  Filled 2018-11-08 (×3): qty 1

## 2018-11-08 NOTE — Progress Notes (Signed)
[facility] [place] NAME: Earl Clay    MR#:  [id number]  DATE OF BIRTH:  [birth year]    CHIEF COMPLAINT:   Chief Complaint  Patient presents with  . Respiratory Distress   On high flow nasal cannula.  Disoriented.  [place] on.  REVIEW OF SYSTEMS:   Review of Systems  Unable to perform ROS: Mental status change   DRUG ALLERGIES:   Allergies  Allergen Reactions  . Ampicillin Hives and Other (See Comments)    Has patient had a PCN reaction causing immediate rash, facial/tongue/throat swelling, SOB or lightheadedness with hypotension: No Has patient had a PCN reaction causing severe rash involving mucus membranes or skin necrosis: No Has patient had a PCN reaction that required hospitalization No Has patient had a PCN reaction occurring within the last 10 years: No If all of the above answers are "NO", then may proceed with Cephalosporin use.  . Lorazepam     Worsening confusion  . Penicillins Hives and Other (See Comments)    Has patient had a PCN reaction causing immediate rash, facial/tongue/throat swelling, SOB or lightheadedness with hypotension: No Has patient had a PCN reaction causing severe rash involving mucus membranes or skin necrosis: No Has patient had a PCN reaction that required hospitalization No Has patient had a PCN reaction occurring within the last 10 years: No If all of the above answers are "NO", then may proceed with Cephalosporin use.  . Zyban [Bupropion] Rash    VITALS:  Blood pressure 140/80, pulse 94, temperature 98.5 F (36.9 C), temperature source Oral, resp. rate (!) 23, height 5\' 6"  (1.676 m), weight 64.1 kg, SpO2 94 %.  PHYSICAL EXAMINATION:  GENERAL:  78 y.o.-year-old patient lying in the bed .  Critically ill.  EYES: Pupils equal, round, reactive to light and accommodation. No scleral icterus. Extraocular muscles intact.  HEENT: Head atraumatic, normocephalic. Oropharynx and  nasopharynx clear.  NECK:  Supple, no jugular venous distention. No thyroid enlargement, no tenderness.  LUNGS: Diminished air entry bilaterally.Marland Kitchen   CARDIOVASCULAR: S1, S2 normal. No murmurs, rubs, or gallops.  ABDOMEN: Soft, nontender, nondistended. Bowel sounds present. No organomegaly or mass.  EXTREMITIES: No pedal edema, cyanosis, or clubbing.  NEUROLOGIC: Moves all 4 extremities PSYCHIATRIC: The patient is pleasantly confused SKIN: No obvious rash, lesion, or ulcer.   LABORATORY PANEL:   CBC Recent Labs  Lab 11/06/18 0430  WBC 11.3*  HGB 12.9*  HCT 42.7  PLT 259   ------------------------------------------------------------------------------------------------------------------  Chemistries  Recent Labs  Lab 11/07/18 0518 11/08/18 0500  NA 140 142  K 3.6 3.2*  CL 86* 90*  CO2 39* 38*  GLUCOSE 93 143*  BUN 45* 33*  CREATININE 1.19 0.82  CALCIUM 9.3 9.5  MG 2.7*  --    ------------------------------------------------------------------------------------------------------------------  Cardiac Enzymes No results for input(s): TROPONINI in the last 168 hours. ------------------------------------------------------------------------------------------------------------------  RADIOLOGY:  No results found.  EKG:   Orders placed or performed during the hospital encounter of 10/31/18  . ED EKG  . ED EKG  . EKG 12-Lead  . EKG 12-Lead    ASSESSMENT AND PLAN:   1. Acute respiratory failure on chronic respiratory failure with hypoxia secondary to community-acquired pneumonia. He does have poor baseline pulmonary function secondary to his COPD and lung cancer.  Alternating between BiPAP and high flow nasal cannula.  On BiPAP today  IV antibiotics with Rocephin, Zithromax COVID-19 test has been negative  2.  COPD exacerbation, continue  oxygen, steroids.  Continue bronchodilators with Pulmicort, DuoNeb's.  4. Hypokalemia: Replaced.  5.  Acute renal failure -  resolved  6. Lung cancer CT chest showed worsening of left hilar cancer. No treatment planned at this time due to poor functional status  7.  Acute metabolic encephalopathy.   component of ICU delirium.  Overall prognosis poor due to underlying lung cancer, COPD ,advanced age.  No significant improvement with aggressive care Appropriate for hospice   All the records are reviewed and case discussed with Care Management/Social Worker Management plans discussed with the patient, family and they are in agreement.  CODE STATUS: DNR  TOTAL TIME TAKING CARE OF THIS PATIENT: 35 minutes.   Leia Alf Marilyne Haseley M.D on 11/08/2018 at 10:36 AM  Between 7am to 6pm - Pager - [phone]  After 6pm go to [email or web address] - password [email or web address] [facility]  Office  [phone]  CC: Primary care physician; [name], MD   Note: This dictation was prepared with Dragon dictation along with smaller phrase technology. Any transcriptional errors that result from this process are unintentional.

## 2018-11-08 NOTE — Progress Notes (Signed)
New referral for [facility] services at home received following a Palliative Medicine follow up visit. Patient is a 78 year old man with multiple medical problems including COPD, recurrent lung cancer and CKD stage 3. He was admitted to [facility] from home on 4/26 with acute on chronic respiratory failure, he has required intermittent BIPAP as well as hiflo oxygen, currently requiring 8-10 liters via nasal cannula. Writer spoke via telephone to patient's daughter Hennie Duos and his long time friend [name] to initiated education regarding hospice services, philosophy and team approach to care with understanding voiced. DME ordered to include a hospital bed, oxygen concentrators to supply up to 10 liters of oxygen, BSC and BIPAP.  Haynes Dage will contact the hospital staff RN when DME is in place. Plan is for discharge home tomorrow via EMS with signed DNR  AFTER DME is in place. Darke and Palliative NP Mariana Kaufman updated. Will continue to follow through discharge. Flo Shanks BSN, RN, [facility] [phone]

## 2018-11-08 NOTE — Progress Notes (Addendum)
Pharmacy Electrolyte Monitoring Consult:  Pharmacy consulted to assist in monitoring and replacing electrolytes in this 78 y.o. male admitted on 10/31/2018 with CAP/COPD exacerbation.   Labs:  Sodium (mmol/L)  Date Value  11/08/2018 142  05/19/2014 139   Potassium (mmol/L)  Date Value  11/08/2018 3.2 (L)  05/19/2014 4.4   Magnesium (mg/dL)  Date Value  11/07/2018 2.7 (H)  05/16/2014 1.9   Phosphorus (mg/dL)  Date Value  11/04/2018 2.8   Calcium (mg/dL)  Date Value  11/08/2018 9.5   Calcium, Total (mg/dL)  Date Value  05/19/2014 8.3 (L)   Albumin (g/dL)  Date Value  08/08/2018 3.6  05/16/2014 3.9    Assessment/Plan: Patient unable to tolerate oral therapies. Will order potassium 93mEq IV Q1hr x 5 doses.   Unless otherwise clinically indicated   Pharmacy will continue to monitor and adjust per consult.   Tyrin Herbers L 11/08/2018 4:28 PM

## 2018-11-08 NOTE — Progress Notes (Signed)
Pastoral Care Visit   11/08/18 1100  Clinical Encounter Type  Visited With Patient  Visit Type Follow-up  Spiritual Encounters  Spiritual Needs Other (Comment) (ice chips)  Stress Factors  Patient Stress Factors Health changes   This chap heard pt crying out for help. Upon entering room, pt was sitting up with legs out of bed. Pt asked for my help to get him out of the bed.  Melven Sartorius got nursing staff to assist w/ pt. Pt requested water.  With perm from RN, chap fed ice chips to pt. Pt calmed down and was lying down peacefully when chap left the room.  Darcey Nora, Chaplain

## 2018-11-08 NOTE — Progress Notes (Signed)
After further assessment and discussion of patient's  status and medical condition during multidisciplinary rounds, the plan is outlined as below  1.  Continue discussion with transition to hospice given that patient has recurrence of lung cancer with no chemotherapy or radiation therapy options left.  2.  Discontinue antibiotics after today's last dose.  There is no evidence of pneumonia patient's findings are consistent with recurrent carcinoma.  3.  Haldol for acute delirium.  Reorienting patient by Geographical information systems officer.  4.  Continue to titrate oxygen for saturations of 88 to 92%.   Renold Don, M.D.  [facility]

## 2018-11-08 NOTE — Progress Notes (Signed)
Daily Progress Note   Patient Name: Earl Clay       Date: 11/08/2018 DOB: 03-Dec-1940  Age: 78 y.o. MRN#: [id number] Attending Physician: Hillary Bow, MD Primary Care Physician: [name], MD Admit Date: 10/31/2018  Reason for Consultation/Follow-up: Establishing goals of care  Subjective: Evaluated patient- he was mildly confused, but pleasant. No complaints of SOB, on 10L HF nasal cannula. Discussed case with Dr. Patsey Berthold. Patient with progressing lung cancer- per Dr. Burlene Arnt- not candidate for further treatment. He has end stage COPD. Called family- discussed patient's likely trajectory based on end stage COPD and progressing lung cancer. Discussed that he is likely to die from poor respiratory status combination of end stage COPD and progressing lung cancer. Discussed that we cannot reverse the illnesses, but can use symptom management to relieve suffering at EOL. Families GOC would be to have patient at home for as long as possible.   Review of Systems  Unable to perform ROS: Mental status change    Length of Stay: 8  Current Medications: Scheduled Meds:  . aspirin  81 mg Oral Daily  . budesonide (PULMICORT) nebulizer solution  0.5 mg Nebulization Q12H  . enoxaparin (LOVENOX) injection  40 mg Subcutaneous Q24H  . hydrOXYzine  10 mg Oral QHS  . insulin aspart  0-15 Units Subcutaneous Q4H  . insulin detemir  7 Units Subcutaneous Daily  . ipratropium-albuterol  3 mL Nebulization Q6H  . pantoprazole  40 mg Oral Daily  . predniSONE  20 mg Oral Q breakfast  . QUEtiapine  100 mg Oral QHS  . senna-docusate  2 tablet Oral BID  . sodium chloride flush  3 mL Intravenous Q12H  . tamsulosin  0.4 mg Oral QPC breakfast  . theophylline  300 mg Oral BID  . traZODone  100  mg Oral QHS    Continuous Infusions: . sodium chloride    . potassium chloride      PRN Meds: sodium chloride, bisacodyl, haloperidol lactate, LORazepam, midodrine, morphine, morphine injection, sodium chloride flush  Physical Exam Vitals signs and nursing note reviewed.  Constitutional:      Appearance: He is ill-appearing.  Cardiovascular:     Rate and Rhythm: Regular rhythm.  Pulmonary:     Comments: diminished Skin:    Coloration: Skin is pale.  Neurological:  Mental Status: He is disoriented.  Psychiatric:     Comments: impulsive             Vital Signs: BP (!) 151/84   Pulse (!) 113   Temp 98.5 F (36.9 C) (Oral)   Resp (!) 23   Ht 5\' 6"  (1.676 m)   Wt 64.1 kg   SpO2 100%   BMI 22.81 kg/m  SpO2: SpO2: 100 % O2 Device: O2 Device: High Flow Nasal Cannula O2 Flow Rate: O2 Flow Rate (L/min): 10 L/min  Intake/output summary:   Intake/Output Summary (Last 24 hours) at 11/08/2018 1255 Last data filed at 11/08/2018 0400 Gross per 24 hour  Intake 450 ml  Output 575 ml  Net -125 ml   LBM: Last BM Date: (PTA) Baseline Weight: Weight: 74.8 kg Most recent weight: Weight: 64.1 kg       Palliative Assessment/Data: PPS: 20%    Flowsheet Rows     Most Recent Value  Intake Tab  Referral Department  Hospitalist  Unit at Time of Referral  Intermediate Care Unit  Palliative Care Primary Diagnosis  Pulmonary  Date Notified  10/31/18  Palliative Care Type  Return patient Palliative Care  Reason for referral  Clarify Goals of Care  Date of Admission  10/31/18  Date first seen by Palliative Care  11/01/18  # of days Palliative referral response time  1 Day(s)  # of days IP prior to Palliative referral  0  Clinical Assessment  Pain Max last 24 hours  Not able to report  Pain Min Last 24 hours  Not able to report  Dyspnea Max Last 24 Hours  Not able to report  Dyspnea Min Last 24 hours  Not able to report  Psychosocial & Spiritual Assessment  Palliative Care  Outcomes      Patient Active Problem List   Diagnosis Date Noted  . Encounter for hospice care discussion   . Acute respiratory failure ([place])   . Goals of care, counseling/discussion   . Palliative care by specialist   . PNA (pneumonia) 10/31/2018  . Community acquired pneumonia 10/31/2018  . Acute on chronic respiratory failure with hypoxemia ([place]) 08/08/2018  . Lung nodule, solitary 08/15/2016  . 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 Malnutrition of moderate degree 09/09/2015  . COPD exacerbation ([place]) 09/09/2015  . ARF (acute renal failure) ([place]) 09/07/2015  . Type 2 diabetes mellitus ([place]) 07/20/2015  . Type 2 diabetes mellitus with hyperglycemia ([place]) 05/16/2014  . History of cardiac catheterization 01/24/2014  . Breathlessness on exertion 01/24/2014  . Chronic obstructive bronchitis ([place]) 11/17/2013  . CAD in native artery 11/17/2013  . Benign essential HTN 11/17/2013  . Combined fat and carbohydrate induced hyperlipemia 11/17/2013  . Hypersomnia with sleep apnea 11/17/2013  . 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 Pure hypercholesterolemia 11/17/2013  . 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 Elevated prostate specific antigen (PSA) 08/18/2012  . Disorder of bladder function 08/18/2012  . Enlarged prostate with lower urinary tract symptoms (LUTS) 08/18/2012  . Incomplete bladder emptying 08/18/2012  . Benign prostatic hyperplasia with urinary obstruction 08/18/2012  . FOM (frequency of micturition) 08/18/2012    Palliative Care Assessment & Plan   Patient Profile:77 y.o. male  with past medical history of coronary artery disease, diabetes, obstructive sleep apnea(CPAP),COPD (home oxygen 2-4L), PTSD, anxiety, depression, lung cancer x2 s/p chemoradiation therapy, hypertension, and GERD admitted on 10/31/2018 with acute on chronic respiratory failure with hypoxia secondary to community-acquired pneumonia  on lung cancer.    Assessment/Recommendations/Plan   Family (patient's daughter and significant other) agree to discharge patient home with Hospice with goal of symptom management at EOL  Symptoms:   Morphine 2mg  IV q1hr prn for SOB or agitation  Morphine 5mg  solution PO for SOB or agitation  Discussed with [facility] liasion who was already involved in patient's care and made her aware of plan  Code Status:  DNR  Prognosis:   < 6 weeks  Discharge Planning:  Home with Hospice  Care plan was discussed with patient's daughter, significant other, [facility] liasion, and Dr. Patsey Berthold.   Thank you for allowing the Palliative Medicine Team to assist in the care of this patient.   Time In: 1100 Time Out: 1300 Total Time 120 minutes Prolonged Time Billed Yes      Greater than 50%  of this time was spent counseling and coordinating care related to the above assessment and plan.  Mariana Kaufman, AGNP-C Palliative Medicine   Please contact Palliative Medicine Team phone at [phone] for questions and concerns.

## 2018-11-08 NOTE — TOC Progression Note (Signed)
Transition of Care [facility]) - Progression Note    Patient Details  Name: Earl Clay MRN: [id number] Date of Birth: 11/09/1940  Transition of Care [facility]) CM/SW [place], RN Phone Number: 11/08/2018, 12:29 PM  Clinical Narrative:     Patient has untreatable lung cancer Palliative to see today, has a trilogy at home that he uses at night, it is recommended to transition to Comfort care but the family is not ready to do that  Expected Discharge Plan: Home w Hospice Care Barriers to Discharge: Continued Medical Work up  Expected Discharge Plan and Services Expected Discharge Plan: Home w Hospice Care   Discharge Planning Services: CM Consult                     DME Arranged: Other see comment(aflo vest)                     Social Determinants of Health (SDOH) Interventions    Readmission Risk Interventions Readmission Risk Prevention Plan 11/07/2018  SW Recovery Care/Counseling Consult Complete  Palliative Care Screening Complete  [place] Patient Refused  Some recent data might be hidden

## 2018-11-09 LAB — BASIC METABOLIC PANEL
Anion gap: 12 (ref 5–15)
BUN: 37 mg/dL — ABNORMAL HIGH (ref 8–23)
CO2: 36 mmol/L — ABNORMAL HIGH (ref 22–32)
Calcium: 9.4 mg/dL (ref 8.9–10.3)
Chloride: 92 mmol/L — ABNORMAL LOW (ref 98–111)
Creatinine, Ser: 1.03 mg/dL (ref 0.61–1.24)
GFR calc Af Amer: >60 mL/min (ref >60)
GFR calc non Af Amer: >60 mL/min (ref >60)
Glucose, Bld: 149 mg/dL — ABNORMAL HIGH (ref 70–99)
Potassium: 3.9 mmol/L (ref 3.5–5.1)
Sodium: 140 mmol/L (ref 135–145)

## 2018-11-09 LAB — GLUCOSE, CAPILLARY
Glucose-Capillary: 150 mg/dL — ABNORMAL HIGH (ref 70–99)
Glucose-Capillary: 123 mg/dL — ABNORMAL HIGH (ref 70–99)
Glucose-Capillary: 125 mg/dL — ABNORMAL HIGH (ref 70–99)
Glucose-Capillary: 201 mg/dL — ABNORMAL HIGH (ref 70–99)
Glucose-Capillary: 193 mg/dL — ABNORMAL HIGH (ref 70–99)

## 2018-11-09 LAB — CBC WITH DIFFERENTIAL/PLATELET
Abs Immature Granulocytes: 0.04 10*3/uL (ref 0.00–0.07)
Basophils Absolute: 0 10*3/uL (ref 0.0–0.1)
Basophils Relative: 0 %
Eosinophils Absolute: 0.1 10*3/uL (ref 0.0–0.5)
Eosinophils Relative: 1 %
HCT: 43.1 % (ref 39.0–52.0)
Hemoglobin: 13.3 g/dL (ref 13.0–17.0)
Immature Granulocytes: 0 %
Lymphocytes Relative: 10 %
Lymphs Abs: 1 10*3/uL (ref 0.7–4.0)
MCH: 26.9 pg (ref 26.0–34.0)
MCHC: 30.9 g/dL (ref 30.0–36.0)
MCV: 87.2 fL (ref 80.0–100.0)
Monocytes Absolute: 0.7 10*3/uL (ref 0.1–1.0)
Monocytes Relative: 7 %
Neutro Abs: 7.9 10*3/uL — ABNORMAL HIGH (ref 1.7–7.7)
Neutrophils Relative %: 82 %
Platelets: 273 10*3/uL (ref 150–400)
RBC: 4.94 MIL/uL (ref 4.22–5.81)
RDW: 16.3 % — ABNORMAL HIGH (ref 11.5–15.5)
WBC: 9.8 10*3/uL (ref 4.0–10.5)
nRBC: 0 % (ref 0.0–0.2)

## 2018-11-09 MED ORDER — PREDNISONE 10 MG PO TABS: 10.0000 mg | ORAL_TABLET | Freq: Every day | ORAL | 0 refills | Status: AC

## 2018-11-09 MED ORDER — IPRATROPIUM-ALBUTEROL 0.5-2.5 (3) MG/3ML IN SOLN: 3.0000 mL | RESPIRATORY_TRACT | 0 refills | Status: AC | PRN

## 2018-11-09 MED ORDER — LORAZEPAM 0.5 MG PO TABS: 0.5000 mg | ORAL_TABLET | Freq: Four times a day (QID) | ORAL | 0 refills | Status: AC | PRN

## 2018-11-09 MED ORDER — MORPHINE SULFATE (CONCENTRATE) 10 MG /0.5 ML PO SOLN: 10.0000 mg | ORAL | 0 refills | Status: AC | PRN

## 2018-11-09 NOTE — Discharge Summary (Signed)
[facility] NAME: Earl Clay    MR#:  [id number]  DATE OF BIRTH:  [birth year]  DATE OF ADMISSION:  10/31/2018 ADMITTING PHYSICIAN: Max Sane, MD  DATE OF DISCHARGE: 11/09/2018  PRIMARY CARE PHYSICIAN: [name], MD   ADMISSION DIAGNOSIS:  Lactic acidosis [E87.2] Acute respiratory failure, unspecified whether with hypoxia or hypercapnia ([place]) [J96.00] Hypertension, unspecified type [I10] Community acquired pneumonia, unspecified laterality [J18.9] Pneumonia [J18.9]  DISCHARGE DIAGNOSIS:  Active Problems:   Primary cancer of bronchus of left upper lobe (HCC)   PNA (pneumonia)   Community acquired pneumonia   Acute respiratory failure ([place])   Goals of care, counseling/discussion   Palliative care by specialist   Encounter for hospice care discussion   Advanced care planning/counseling discussion   Terminal care   SOB (shortness of breath)   SECONDARY DIAGNOSIS:   Past Medical History:  Diagnosis Date  . Asthma   . CAD (coronary artery disease)   . COPD (chronic obstructive pulmonary disease) ([place])   . Depression   . Diabetes mellitus without complication ([place])   . Difficult intubation   . GERD (gastroesophageal reflux disease)   . Hypertension   . Lung cancer ([place])   . 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 Pancreatic mass   . PTSD (post-traumatic stress disorder)   . Sleep apnea    CPAP with O2 2L/m at night     ADMITTING HISTORY  Earl Clay  is a 78 y.o. male with a known history of COPD and CHF as well as CKD stage III with a past history of lung cancer in 2018 status post radiation therapy.  Patient recently saw his oncologist with possible recurrence according to documentation in the medical record.  No plan discussed at this time regarding this.  He presented to the emergency room via EMS services with increased shortness of breath over the last 2 weeks which became acutely worse  today.  Patient was found to be on his BiPAP machine when EMS services arrived with 70% oxygen saturation.  He was placed on a nonrebreather and brought to the emergency room for further management.  He was found to be in respiratory distress on arrival and therefore started on BiPAP therapy with oxygen supplementation.  Currently oxygen saturations are 94 to 95%.  He denies noting fevers or chills.  He denies chest pain.  No nausea, vomiting, diarrhea.  He denies abdominal pain.  He reports occasional cough which is nonproductive.  He has noted no increased edema of extremities.  Rapid COVID 19 testing is negative.  Patient was treated for pneumonia with hospitalization in February 2020.  On arrival, leukocytosis with WBC 15.8 as well as elevated lactic acid 2.6.  He was hypertensive on arrival with blood pressure 228/201.  Nitroglycerin infusion was initiated at that point.  Troponin is 0.08.  Chest x-ray demonstrated possible infiltrate.  He was started on broad-spectrum antibiotic therapy with azithromycin and Rocephin.  patient has been admitted to the hospital service for further management.  HOSPITAL COURSE:   1. Acute respiratory failure on chronic respiratory failure with hypoxia secondary to community-acquired pneumonia. He does have poor baseline pulmonary function secondary to his COPD and lung cancer.  Alternating between BiPAP and high flow nasal cannula.   Today he is off BiPAP but still needing significant amount of oxygen. Palliative care and pulmonary have discussed with family.  Plan is to discharge patient home with hospice services  for end-of-life care.  IV antibiotics with Rocephin, Zithromax in the hospital and finished course COVID-19 test has been negative  2.  COPD exacerbation, continue oxygen, steroids.  Continue bronchodilators with DuoNeb/albuterol  4. Hypokalemia: Replaced.  5.  Acute renal failure - resolved  6. Lung cancer CT chest showed worsening of left  hilar cancer. No treatment planned at this time due to poor functional status  7.  Acute metabolic encephalopathy.   component of ICU delirium.  Poor prognosis with lung cancer, advanced COPD.  No significant improvement with treatment at this time.  Plan is for discharge home with hospice care for end-of-life.  Morphine and Ativan prescribed PRN.  CONSULTS OBTAINED:  Treatment Team:  [facility], [facility], MD  DRUG ALLERGIES:   Allergies  Allergen Reactions  . Ampicillin Hives and Other (See Comments)    Has patient had a PCN reaction causing immediate rash, facial/tongue/throat swelling, SOB or lightheadedness with hypotension: No Has patient had a PCN reaction causing severe rash involving mucus membranes or skin necrosis: No Has patient had a PCN reaction that required hospitalization No Has patient had a PCN reaction occurring within the last 10 years: No If all of the above answers are "NO", then may proceed with Cephalosporin use.  . Lorazepam     Worsening confusion  . Penicillins Hives and Other (See Comments)    Has patient had a PCN reaction causing immediate rash, facial/tongue/throat swelling, SOB or lightheadedness with hypotension: No Has patient had a PCN reaction causing severe rash involving mucus membranes or skin necrosis: No Has patient had a PCN reaction that required hospitalization No Has patient had a PCN reaction occurring within the last 10 years: No If all of the above answers are "NO", then may proceed with Cephalosporin use.  . Zyban [Bupropion] Rash    DISCHARGE MEDICATIONS:   Allergies as of 11/09/2018      Reactions   Ampicillin Hives, Other (See Comments)   Has patient had a PCN reaction causing immediate rash, facial/tongue/throat swelling, SOB or lightheadedness with hypotension: No Has patient had a PCN reaction causing severe rash involving mucus membranes or skin necrosis: No Has patient had a PCN reaction that required hospitalization No Has  patient had a PCN reaction occurring within the last 10 years: No If all of the above answers are "NO", then may proceed with Cephalosporin use.   Lorazepam    Worsening confusion   Penicillins Hives, Other (See Comments)   Has patient had a PCN reaction causing immediate rash, facial/tongue/throat swelling, SOB or lightheadedness with hypotension: No Has patient had a PCN reaction causing severe rash involving mucus membranes or skin necrosis: No Has patient had a PCN reaction that required hospitalization No Has patient had a PCN reaction occurring within the last 10 years: No If all of the above answers are "NO", then may proceed with Cephalosporin use.   Zyban [bupropion] Rash      Medication List    STOP taking these medications   azithromycin 500 MG tablet Commonly known as:  ZITHROMAX   enalapril 5 MG tablet Commonly known as:  VASOTEC   hydrochlorothiazide 25 MG tablet Commonly known as:  HYDRODIURIL     TAKE these medications   albuterol 108 (90 Base) MCG/ACT inhaler Commonly known as:  VENTOLIN HFA Inhale 2 puffs into the lungs 2 (two) times daily.   aspirin 81 MG chewable tablet Chew 81 mg by mouth daily.   buPROPion 100 MG tablet Commonly known as:  WELLBUTRIN Take 100 mg by mouth 2 (two) times daily.   cetirizine 10 MG tablet Commonly known as:  ZYRTEC Take 10 mg by mouth every evening.   citalopram 20 MG tablet Commonly known as:  CELEXA Take 20 mg by mouth daily.   diltiazem 180 MG 24 hr capsule Commonly known as:  DILACOR XR Take 180 mg by mouth daily.   feeding supplement (ENSURE ENLIVE) Liqd Take 237 mLs by mouth 2 (two) times daily between meals.   furosemide 20 MG tablet Commonly known as:  LASIX Take 20 mg by mouth daily.   hydrOXYzine 10 MG tablet Commonly known as:  ATARAX/VISTARIL Take 10 mg by mouth at bedtime.   insulin aspart 100 UNIT/ML injection Commonly known as:  novoLOG Inject 10-20 Units into the skin 3 (three) times  daily with meals as needed for high blood sugar. Pt uses as needed per sliding scale.   insulin detemir 100 UNIT/ML injection Commonly known as:  LEVEMIR Inject 20 Units into the skin daily.   ipratropium-albuterol 0.5-2.5 (3) MG/3ML Soln Commonly known as:  DUONEB Take 3 mLs by nebulization every 4 (four) hours as needed (Shortness of breath).   LORazepam 0.5 MG tablet Commonly known as:  Ativan Take 1 tablet (0.5 mg total) by mouth every 6 (six) hours as needed for anxiety.   magnesium oxide 400 MG tablet Commonly known as:  MAG-OX Take 400 mg by mouth daily.   morphine CONCENTRATE 10 mg / 0.5 ml concentrated solution Take 0.5 mLs (10 mg total) by mouth every 3 (three) hours as needed for moderate pain, severe pain or shortness of breath.   PARoxetine 40 MG tablet Commonly known as:  PAXIL Take 40 mg by mouth 2 (two) times daily.   polyethylene glycol 17 g packet Commonly known as:  MIRALAX / GLYCOLAX Take 17 g by mouth daily as needed for mild constipation.   pravastatin 80 MG tablet Commonly known as:  PRAVACHOL Take 80 mg by mouth at bedtime.   predniSONE 10 MG tablet Commonly known as:  DELTASONE Take 1 tablet (10 mg total) by mouth daily with breakfast. 40mg  daily for 3 days, 30mg  daily for 3 days, 20mg  daily for 3 days, 10mg  daily for 3 days What changed:    how much to take  how to take this  when to take this  additional instructions   QUEtiapine 100 MG tablet Commonly known as:  SEROQUEL Take 1 tablet by mouth at bedtime.   sildenafil 100 MG tablet Commonly known as:  VIAGRA Take 100 mg by mouth daily as needed for erectile dysfunction.   tamsulosin 0.4 MG Caps capsule Commonly known as:  FLOMAX Take 0.4 mg by mouth at bedtime.   theophylline 300 MG 24 hr capsule Commonly known as:  THEO-24 Take 300 mg by mouth 2 (two) times daily.   tiotropium 18 MCG inhalation capsule Commonly known as:  SPIRIVA Place 18 mcg into inhaler and inhale daily.    tiZANidine 4 MG tablet Commonly known as:  ZANAFLEX Take 4 mg by mouth at bedtime.   traZODone 100 MG tablet Commonly known as:  DESYREL Take 100 mg by mouth at bedtime.       Today   VITAL SIGNS:  Blood pressure (!) 150/80, pulse (!) 112, temperature 98.7 F (37.1 C), temperature source Other (Comment), resp. rate 20, height 5\' 6"  (1.676 m), weight 64.1 kg, SpO2 96 %.  I/O:    Intake/Output Summary (Last 24 hours) at 11/09/2018 1157  Last data filed at 11/09/2018 6237 Gross per 24 hour  Intake 401.42 ml  Output 800 ml  Net -398.58 ml    PHYSICAL EXAMINATION:  Physical Exam  GENERAL:  78 y.o.-year-old patient lying in the bed with resp distress LUNGS: increase work of breathing CARDIOVASCULAR: S1, S2 normal. No murmurs, rubs, or gallops.  ABDOMEN: Soft, non-tender, non-distended. Bowel sounds present. No organomegaly or mass.  NEUROLOGIC: Moves all 4 extremities. PSYCHIATRIC: The patient is anxious SKIN: No obvious rash, lesion, or ulcer.   DATA REVIEW:   CBC Recent Labs  Lab 11/09/18 0515  WBC 9.8  HGB 13.3  HCT 43.1  PLT 273    Chemistries  Recent Labs  Lab 11/07/18 0518  11/09/18 0515  NA 140   < > 140  K 3.6   < > 3.9  CL 86*   < > 92*  CO2 39*   < > 36*  GLUCOSE 93   < > 149*  BUN 45*   < > 37*  CREATININE 1.19   < > 1.03  CALCIUM 9.3   < > 9.4  MG 2.7*  --   --    < > = values in this interval not displayed.    Cardiac Enzymes No results for input(s): TROPONINI in the last 168 hours.  Microbiology Results  Results for orders placed or performed during the hospital encounter of 10/31/18  SARS Coronavirus 2 [facility] order, Performed in [place] hospital lab)     Status: None   Collection Time: 10/31/18  6:08 PM  Result Value Ref Range Status   SARS Coronavirus 2 NEGATIVE NEGATIVE Final    Comment: (NOTE) If result is NEGATIVE SARS-CoV-2 target nucleic acids are NOT DETECTED. The SARS-CoV-2 RNA is generally detectable in upper and  lower  respiratory specimens during the acute phase of infection. The lowest  concentration of SARS-CoV-2 viral copies this assay can detect is 250  copies / mL. A negative result does not preclude SARS-CoV-2 infection  and should not be used as the sole basis for treatment or other  patient management decisions.  A negative result may occur with  improper specimen collection / handling, submission of specimen other  than nasopharyngeal swab, presence of viral mutation(s) within the  areas targeted by this assay, and inadequate number of viral copies  (<250 copies / mL). A negative result must be combined with clinical  observations, patient history, and epidemiological information. If result is POSITIVE SARS-CoV-2 target nucleic acids are DETECTED. The SARS-CoV-2 RNA is generally detectable in upper and lower  respiratory specimens dur ing the acute phase of infection.  Positive  results are indicative of active infection with SARS-CoV-2.  Clinical  correlation with patient history and other diagnostic information is  necessary to determine patient infection status.  Positive results do  not rule out bacterial infection or co-infection with other viruses. If result is PRESUMPTIVE POSTIVE SARS-CoV-2 nucleic acids MAY BE PRESENT.   A presumptive positive result was obtained on the submitted specimen  and confirmed on repeat testing.  While 2019 novel coronavirus  (SARS-CoV-2) nucleic acids may be present in the submitted sample  additional confirmatory testing may be necessary for epidemiological  and / or clinical management purposes  to differentiate between  SARS-CoV-2 and other Sarbecovirus currently known to infect humans.  If clinically indicated additional testing with an alternate test  methodology [phone]) is advised. The SARS-CoV-2 RNA is generally  detectable in upper and lower respiratory sp ecimens during  the acute  phase of infection. The expected result is  Negative. Fact Sheet for Patients:  StrictlyIdeas.no Fact Sheet for Healthcare Providers: BankingDealers.co.za This test is not yet approved or cleared by the [place] FDA and has been authorized for detection and/or diagnosis of SARS-CoV-2 by FDA under an Emergency Use Authorization (EUA).  This EUA will remain in effect (meaning this test can be used) for the duration of the COVID-19 declaration under Section 564(b)(1) of the Act, 21 U.S.C. section 360bbb-3(b)(1), unless the authorization is terminated or revoked sooner. Performed at [facility], [address] [place], [place] [address]   Blood Culture (routine x 2)     Status: None   Collection Time: 10/31/18  6:08 PM  Result Value Ref Range Status   Specimen Description BLOOD RIGHT ANTECUBITAL  Final   Special Requests   Final    BOTTLES DRAWN AEROBIC AND ANAEROBIC Blood Culture adequate volume   Culture   Final    NO GROWTH 5 DAYS Performed at [facility], [address] [place], [place] [address]    Report Status 11/05/2018 FINAL  Final  Respiratory Panel by PCR     Status: None   Collection Time: 10/31/18  6:08 PM  Result Value Ref Range Status   Adenovirus NOT DETECTED NOT DETECTED Final   Coronavirus 229E NOT DETECTED NOT DETECTED Final    Comment: (NOTE) The Coronavirus on the Respiratory Panel, DOES NOT test for the novel  Coronavirus (2019 nCoV)    Coronavirus HKU1 NOT DETECTED NOT DETECTED Final   Coronavirus NL63 NOT DETECTED NOT DETECTED Final   Coronavirus OC43 NOT DETECTED NOT DETECTED Final   Metapneumovirus NOT DETECTED NOT DETECTED Final   Rhinovirus / Enterovirus NOT DETECTED NOT DETECTED Final   Influenza A NOT DETECTED NOT DETECTED Final   Influenza B NOT DETECTED NOT DETECTED Final   Parainfluenza Virus 1 NOT DETECTED NOT DETECTED Final   Parainfluenza Virus 2 NOT DETECTED NOT DETECTED Final   Parainfluenza Virus 3 NOT  DETECTED NOT DETECTED Final   Parainfluenza Virus 4 NOT DETECTED NOT DETECTED Final   Respiratory Syncytial Virus NOT DETECTED NOT DETECTED Final   Bordetella pertussis NOT DETECTED NOT DETECTED Final   Chlamydophila pneumoniae NOT DETECTED NOT DETECTED Final   Mycoplasma pneumoniae NOT DETECTED NOT DETECTED Final    Comment: Performed at [facility], [place] [address] [place], [place] [address]  Blood Culture (routine x 2)     Status: None   Collection Time: 10/31/18  6:13 PM  Result Value Ref Range Status   Specimen Description BLOOD BLOOD LEFT FOREARM  Final   Special Requests   Final    BOTTLES DRAWN AEROBIC AND ANAEROBIC Blood Culture adequate volume   Culture   Final    NO GROWTH 5 DAYS Performed at [facility], [address] [place], [place] [address]    Report Status 11/05/2018 FINAL  Final  MRSA PCR Screening     Status: None   Collection Time: 11/01/18  2:26 AM  Result Value Ref Range Status   MRSA by PCR NEGATIVE NEGATIVE Final    Comment:        The GeneXpert MRSA Assay (FDA approved for NASAL specimens only), is one component of a comprehensive MRSA colonization surveillance program. It is not intended to diagnose MRSA infection nor to guide or monitor treatment for MRSA infections. Performed at [facility], [address], [place],  [address]  RADIOLOGY:  No results found.  Follow up with PCP in 1 week.  Management plans discussed with the patient, family and they are in agreement.  CODE STATUS:     Code Status Orders  (From admission, onward)         Start     Ordered   10/31/18 2353  Do not attempt resuscitation (DNR)  Continuous    Question Answer Comment  In the event of cardiac or respiratory ARREST Do not call a "code blue"   In the event of cardiac or respiratory ARREST Do not perform Intubation, CPR, defibrillation or ACLS   In the event of cardiac or respiratory ARREST Use medication by any route,  position, wound care, and other measures to relive pain and suffering. May use oxygen, suction and manual treatment of airway obstruction as needed for comfort.      10/31/18 2352        Code Status History    Date Active Date Inactive Code Status Order ID Comments User Context   10/31/2018 2217 10/31/2018 2352 Full Code [id number]  Mayer Camel, NP ED   10/31/2018 2214 10/31/2018 2217 DNR [id number]  Max Sane, MD ED   08/13/2018 1437 08/17/2018 1512 DNR [id number]  Jimmy Footman, NP Inpatient   08/08/2018 1643 08/13/2018 1436 DNR [id number]  Fritzi Mandes, MD Inpatient   09/07/2015 1741 09/09/2015 1717 Full Code [id number]  Hillary Bow, MD ED    Advance Directive Documentation     Most Recent Value  Type of Advance Directive  Out of facility DNR (pink MOST or yellow form)  Pre-existing out of facility DNR order (yellow form or pink MOST form)  -  "MOST" Form in Place?  -      TOTAL TIME TAKING CARE OF THIS PATIENT ON DAY OF DISCHARGE: more than 30 minutes.   Earl Clay M.D on 11/09/2018 at 11:57 AM  Between 7am to 6pm - Pager - [phone]  After 6pm go to [email or web address] - password [email or web address] [facility]  Office  [phone]  CC: Primary care physician; [name], MD  Note: This dictation was prepared with Dragon dictation along with smaller phrase technology. Any transcriptional errors that result from this process are unintentional.

## 2018-11-09 NOTE — Progress Notes (Signed)
DC packet completed and daughter Audrea Muscat called with instructions by writing RN, understanding verbalized.  She prefers urinary catheter to remain in place for now, okd by [facility].  Pt eager to go home.  EMS notified of transportation need.  IV site DCd and scrips placed in packet.

## 2018-11-09 NOTE — Progress Notes (Addendum)
Visit made to new referral for [facility] services at home. Patient seen sitting up in bed, currently on hiflo oxygen. Patient was alert and greeted Probation officer, glad to be going home. During visit, patient noted to cough with melted ice chips, he also tried to drink from the end of a spoon, was easily redirected, no anxiety/agitation noted. DME in place, confirmed via telephone conversation with patient's daughter Audrea Muscat.  Writer provided education regarding new prescriptions for liquid morphine and lorazepam as well. Haynes Dage voiced her understanding.Hospice contact number given. Patient will be discharged home today via EMS. Signed DNR to accompany patient. Staff RN Malachy Mood, [facility] Deliliah and attending physicians aware of discharge plan. Discharge summary faxed to referral. Thank you. Flo Shanks BSN, RN, [facility] hospice [phone]

## 2018-11-09 NOTE — Progress Notes (Signed)
Patient placed on Sarita from [place].

## 2018-11-09 NOTE — Progress Notes (Signed)
Daily Progress Note   Patient Name: Earl Clay       Date: 11/09/2018 DOB: [birth year]  Age: 78 y.o. MRN#: [id number] Attending Physician: Hillary Bow, MD Primary Care Physician: [name], MD Admit Date: 10/31/2018  Reason for Consultation/Follow-up: Establishing goals of care  Subjective: Patient sitting in bed, pleasant. Karen from Hospice at bedside. Plan for patient to discharge home with Hospice today. Patient has no complaints.   ROS  Length of Stay: 9  Current Medications: Scheduled Meds:  . aspirin  81 mg Oral Daily  . budesonide (PULMICORT) nebulizer solution  0.5 mg Nebulization Q12H  . enoxaparin (LOVENOX) injection  40 mg Subcutaneous Q24H  . hydrOXYzine  10 mg Oral QHS  . ipratropium-albuterol  3 mL Nebulization Q6H  . pantoprazole (PROTONIX) IV  40 mg Intravenous Daily  . predniSONE  20 mg Oral Q breakfast  . QUEtiapine  100 mg Oral QHS  . senna-docusate  2 tablet Oral BID  . sodium chloride flush  3 mL Intravenous Q12H  . tamsulosin  0.4 mg Oral QPC breakfast  . theophylline  300 mg Oral BID  . traZODone  100 mg Oral QHS    Continuous Infusions: . sodium chloride 250 mL (11/08/18 1440)    PRN Meds: sodium chloride, bisacodyl, haloperidol lactate, LORazepam, midodrine, morphine, morphine injection, sodium chloride flush  Physical Exam          Vital Signs: BP (!) 150/80   Pulse (!) 112   Temp 98.7 F (37.1 C) (Other (Comment)) Comment (Src): rt groin  Resp 20   Ht 5\' 6"  (1.676 m)   Wt 64.1 kg   SpO2 96%   BMI 22.81 kg/m  SpO2: SpO2: 96 % O2 Device: O2 Device: High Flow Nasal Cannula O2 Flow Rate: O2 Flow Rate (L/min): 12 L/min  Intake/output summary:   Intake/Output Summary (Last 24 hours) at 11/09/2018 1237 Last data filed at  11/09/2018 6720 Gross per 24 hour  Intake 401.42 ml  Output 800 ml  Net -398.58 ml   LBM: Last BM Date: (PTA) Baseline Weight: Weight: 74.8 kg Most recent weight: Weight: 64.1 kg       Palliative Assessment/Data: PPS: 50%    Flowsheet Rows     Most Recent Value  Intake Tab  Referral Department  Hospitalist  Unit  at Time of Referral  Intermediate Care Unit  Palliative Care Primary Diagnosis  Pulmonary  Date Notified  10/31/18  Palliative Care Type  Return patient Palliative Care  Reason for referral  Clarify Goals of Care  Date of Admission  10/31/18  Date first seen by Palliative Care  11/01/18  # of days Palliative referral response time  1 Day(s)  # of days IP prior to Palliative referral  0  Clinical Assessment  Pain Max last 24 hours  Not able to report  Pain Min Last 24 hours  Not able to report  Dyspnea Max Last 24 Hours  Not able to report  Dyspnea Min Last 24 hours  Not able to report  Psychosocial & Spiritual Assessment  Palliative Care Outcomes      Patient Active Problem List   Diagnosis Date Noted  . Advanced care planning/counseling discussion   . Terminal care   . SOB (shortness of breath)   . Encounter for hospice care discussion   . Acute respiratory failure ([place])   . Goals of care, counseling/discussion   . Palliative care by specialist   . PNA (pneumonia) 10/31/2018  . Community acquired pneumonia 10/31/2018  . Acute on chronic respiratory failure with hypoxemia ([place]) 08/08/2018  . Lung nodule, solitary 08/15/2016  . 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 Malnutrition of moderate degree 09/09/2015  . COPD exacerbation ([place]) 09/09/2015  . ARF (acute renal failure) ([place]) 09/07/2015  . Type 2 diabetes mellitus ([place]) 07/20/2015  . Type 2 diabetes mellitus with hyperglycemia ([place]) 05/16/2014  . History of cardiac catheterization 01/24/2014  . Breathlessness on exertion 01/24/2014  . Chronic obstructive bronchitis ([place])  11/17/2013  . CAD in native artery 11/17/2013  . Benign essential HTN 11/17/2013  . Combined fat and carbohydrate induced hyperlipemia 11/17/2013  . Hypersomnia with sleep apnea 11/17/2013  . Panlobular emphysema ([place]) 11/17/2013  . Pure hypercholesterolemia 11/17/2013  . 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 Elevated prostate specific antigen (PSA) 08/18/2012  . Disorder of bladder function 08/18/2012  . Enlarged prostate with lower urinary tract symptoms (LUTS) 08/18/2012  . Incomplete bladder emptying 08/18/2012  . Benign prostatic hyperplasia with urinary obstruction 08/18/2012  . FOM (frequency of micturition) 08/18/2012    Palliative Care Assessment & Plan   Patient Profile: 78 y.o.malewith past medical history of coronary artery disease, diabetes, obstructive sleep apnea(CPAP),COPD (home oxygen 2-4L), PTSD, anxiety, depression, lung cancer x2 s/p chemoradiation therapy, hypertension, and GERDadmitted on 4/26/2020with acute on chronic respiratory failure with hypoxia secondary to community-acquired pneumonia on lung cancer.   Assessment/Recommendations/Plan   Plan for d/c home with Hospice today  Continue prn morphine and lorazepam for symptom control  Will order soft diet  Goals of Care and Additional Recommendations:  Limitations on Scope of Treatment: Avoid Hospitalization and Minimize Medications  Code Status:  DNR  Prognosis:   < 6 weeks in the setting of progressing cancer with no further treatment options, poor functional status, repeat hospitalizations for obstructive pneumonia  Discharge Planning:  Home with Hospice  Care plan was discussed with patient, his daughter, significant other, Dr. Patsey Berthold and Santiago Glad, [place] liaison.   Thank you for allowing the Palliative Medicine Team to assist in the care of this patient.   Time In: 1015 Time Out: 1050 Total Time 35 minutes Prolonged Time Billed no      Greater than 50%  of this  time was spent counseling and coordinating care related to the above  assessment and plan.  Mariana Kaufman, AGNP-C Palliative Medicine   Please contact Palliative Medicine Team phone at [phone] for questions and concerns.

## 2018-12-06 DEATH — deceased

## 2019-02-07 ENCOUNTER — Inpatient Hospital Stay: Payer: PRIVATE HEALTH INSURANCE | Admitting: Internal Medicine

## 2019-04-04 ENCOUNTER — Ambulatory Visit: Payer: Medicare Other | Admitting: Radiation Oncology

## 2020-05-21 IMAGING — CT CT CHEST WITHOUT CONTRAST
2 of 4 series · 12 of 36 positions shown, 15 images · non-contrast
Comparison: Chest CT 03/29/2018.
COMPARISON: Chest CT 03/29/2018.

Addendum:
CLINICAL DATA: 77-year-old male with history of left upper lobe
lung cancer diagnosed in 2311 status post radiation therapy,
completed in 0424. Worsening shortness of breath on oxygen.

EXAM:
CT CHEST WITHOUT CONTRAST
TECHNIQUE: Multidetector CT imaging of the chest was performed following the
standard protocol without IV contrast.

[Series 2: chest · axial · 0.72mm/px · z∈[-1174,-888]mm · 9 of 171 slices shown, 12 images (1 of 2)]
[im 14/171  mediastinal]
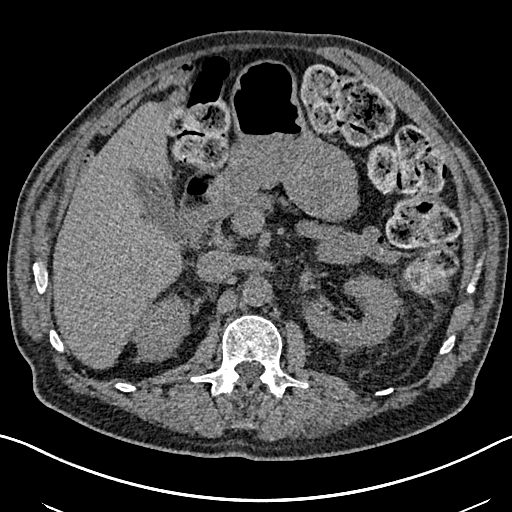
[im 14/171  lung]
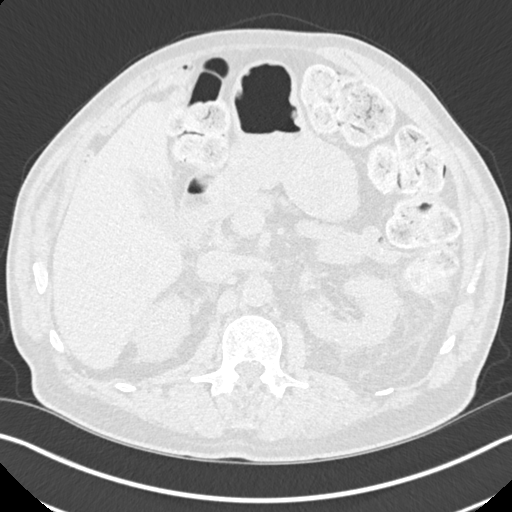
[im 40/171  lung]
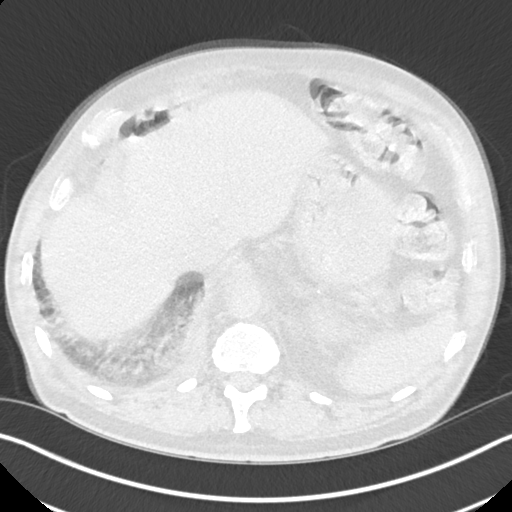
[im 53/171  lung]
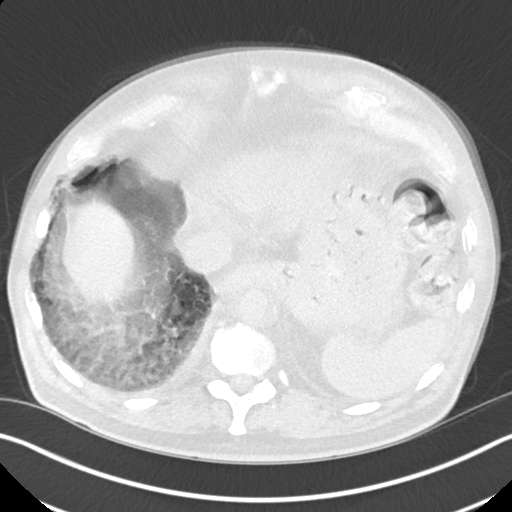
[im 66/171  lung]
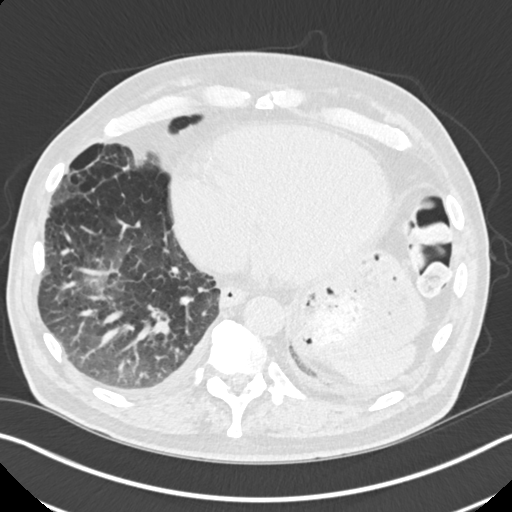
[im 92/171  mediastinal]
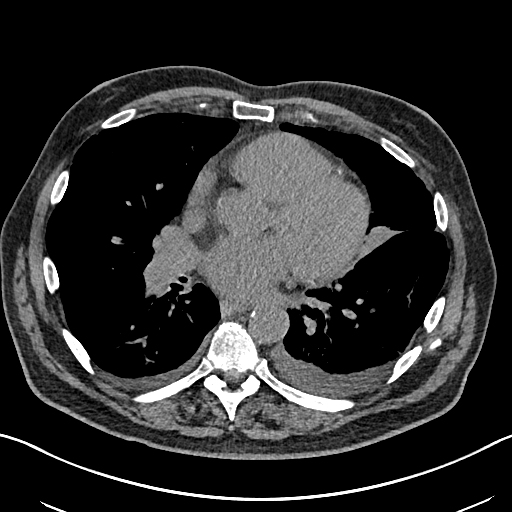
[im 92/171  lung]
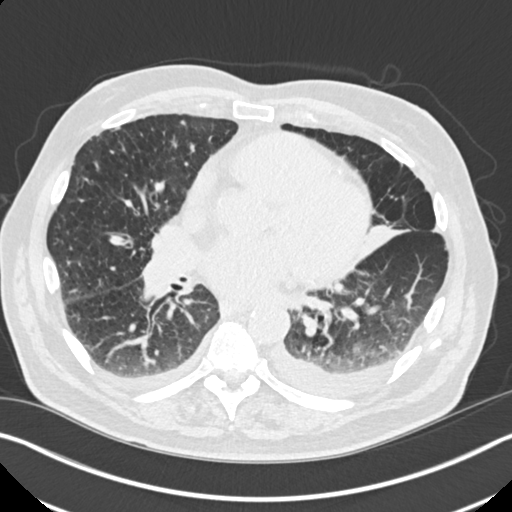
[im 105/171  lung]
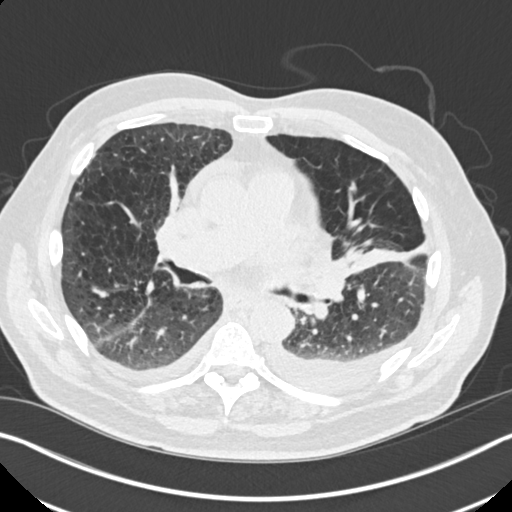
[im 118/171  lung]
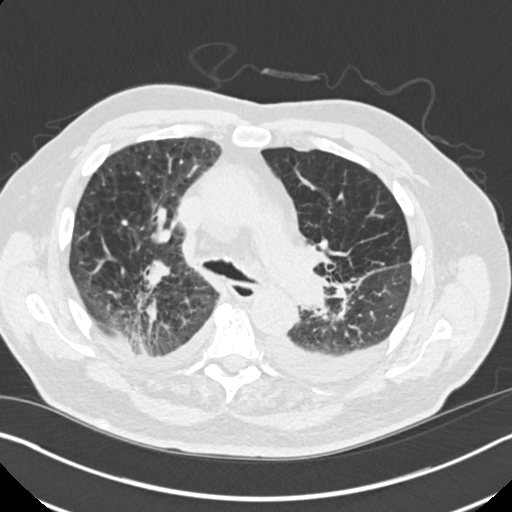
[im 144/171  lung]
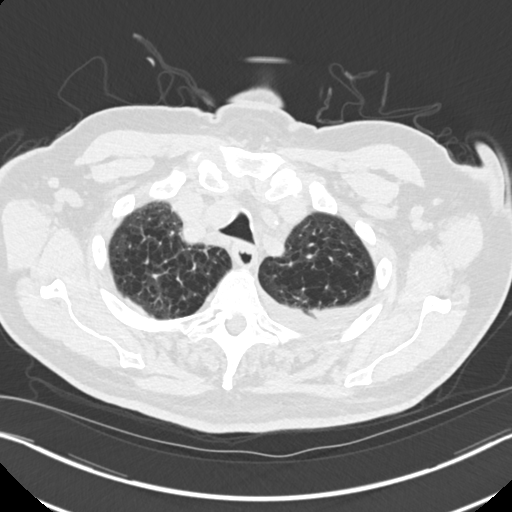
[im 157/171  mediastinal]
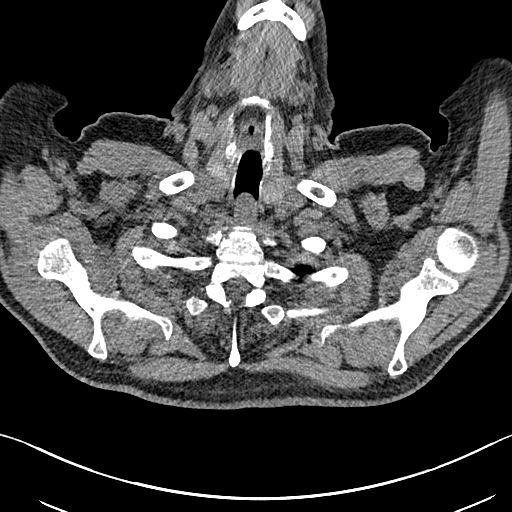
[im 157/171  lung]
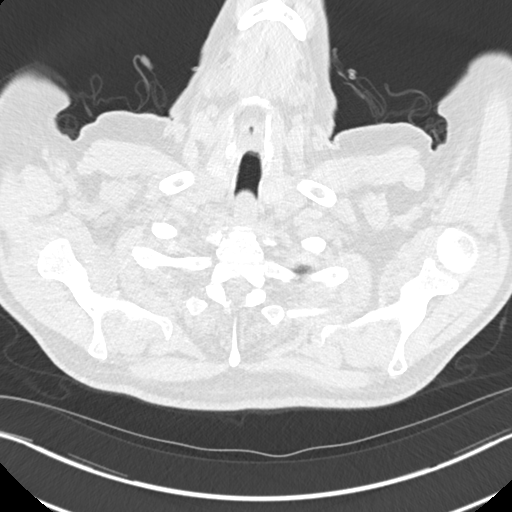

[Series 5: chest · coronal · 0.67mm/px · 3 of 157 slices shown (2 of 2)]
[im 32/157  lung]
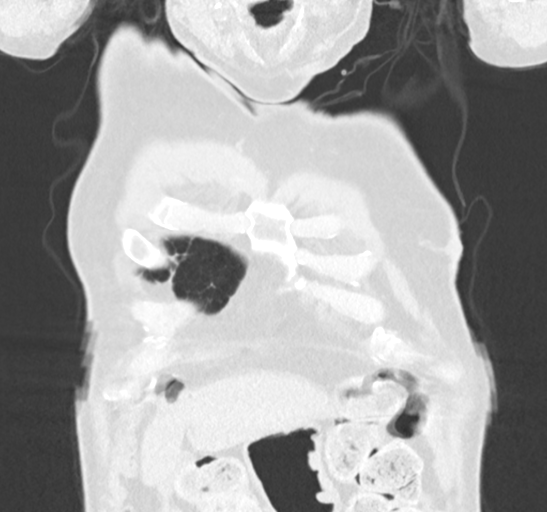
[im 63/157  lung]
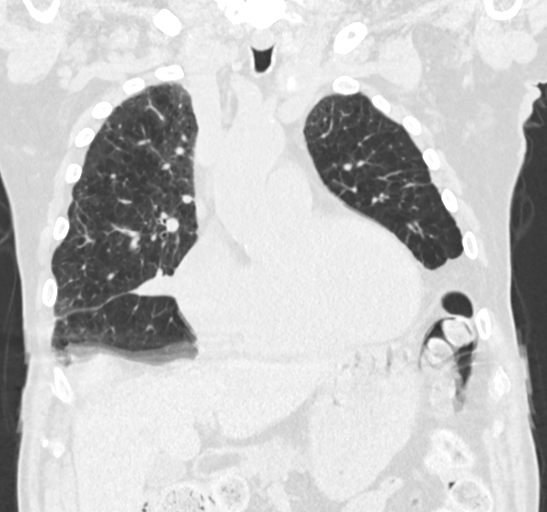
[im 94/157  lung]
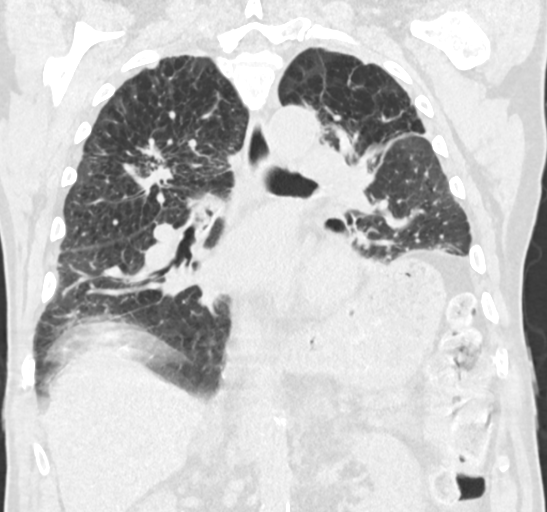

[12 of 36 positions shown; findings below may reference images not displayed]

FINDINGS: Cardiovascular: Heart size is mildly enlarged. There is no
significant pericardial fluid, thickening or pericardial
calcification. There is aortic atherosclerosis, as well as
atherosclerosis of the great vessels of the mediastinum and the
coronary arteries, including calcified atherosclerotic plaque in the
left anterior descending, left circumflex and right coronary
arteries.

Mediastinum/Nodes: Increasing soft tissue fullness in the left hilar
region, incompletely evaluated on today's noncontrast CT
examination, but this area currently measures approximately 5.0 x
3.8 cm (axial image 62 of series 2) as compared with 3.8 x 3.7 cm on
03/29/2018. No pathologically enlarged mediastinal or right hilar
lymph nodes. Please note that accurate exclusion of hilar adenopathy
is limited on noncontrast CT scans. Esophagus is unremarkable in
appearance.

Lungs/Pleura: Extending inferiorly from the mass-like area of
fullness in the left hilar region there is some marked soft tissue
thickening and nodularity noted in the posterior aspect of the left
upper lobe immediately adjacent to the left major fissure, best
appreciated on axial image 75 of series 3, concerning for additional
malignant tissue. Small left and trace right pleural effusions new
compared to the prior examination. Previously noted areas of nodular
architectural distortion in the right upper lobe appears similar to
the prior examination, with the largest single nodule in this region
measuring up to 10 mm (axial image 56 of series 3), stable. Diffuse
bronchial wall thickening with moderate centrilobular and mild
paraseptal emphysema.

Upper Abdomen: Bilateral low-attenuation adrenal nodules, compatible
with adenomas, measuring up to 1.9 cm on the left side, similar to
prior studies. Aortic atherosclerosis.

Musculoskeletal: There are no aggressive appearing lytic or blastic
lesions noted in the visualized portions of the skeleton.
IMPRESSION: 1. Today's study is highly concerning for local recurrence of
disease with increasing conspicuity of mass-like area in the left
hilar region extending into the left upper lobe with what appears to
be malignant soft tissue extending along the posterior aspect of the
left upper lobe abutting the major fissure, as well as new bilateral
pleural effusions (small on the left and trace on the right).
2. Previously noted nodularity and architectural distortion in the
right upper lobe appear stable.
3. Aortic atherosclerosis, in addition to three-vessel coronary
artery disease. Assessment for potential risk factor modification,
dietary therapy or pharmacologic therapy may be warranted, if
clinically indicated.
4. Diffuse bronchial wall thickening with moderate centrilobular and
mild paraseptal emphysema; imaging findings suggestive of underlying
COPD.
5. Mild cardiomegaly.

Aortic Atherosclerosis (KQAC3-EJD.D) and Emphysema (KQAC3-OF1.1).

ADDENDUM:
Request by ICU provider to comment on presence or absence of
bronchiectasis on this scan.

Scan is significantly motion degraded particularly at the lung
bases, limiting assessment. No significant bronchiectasis is
appreciated within these limitations. The original report is
unchanged.

*** End of Addendum ***
FINDINGS: Cardiovascular: Heart size is mildly enlarged. There is no
significant pericardial fluid, thickening or pericardial
calcification. There is aortic atherosclerosis, as well as
atherosclerosis of the great vessels of the mediastinum and the
coronary arteries, including calcified atherosclerotic plaque in the
left anterior descending, left circumflex and right coronary
arteries.

Mediastinum/Nodes: Increasing soft tissue fullness in the left hilar
region, incompletely evaluated on today's noncontrast CT
examination, but this area currently measures approximately 5.0 x
3.8 cm (axial image 62 of series 2) as compared with 3.8 x 3.7 cm on
03/29/2018. No pathologically enlarged mediastinal or right hilar
lymph nodes. Please note that accurate exclusion of hilar adenopathy
is limited on noncontrast CT scans. Esophagus is unremarkable in
appearance.

Lungs/Pleura: Extending inferiorly from the mass-like area of
fullness in the left hilar region there is some marked soft tissue
thickening and nodularity noted in the posterior aspect of the left
upper lobe immediately adjacent to the left major fissure, best
appreciated on axial image 75 of series 3, concerning for additional
malignant tissue. Small left and trace right pleural effusions new
compared to the prior examination. Previously noted areas of nodular
architectural distortion in the right upper lobe appears similar to
the prior examination, with the largest single nodule in this region
measuring up to 10 mm (axial image 56 of series 3), stable. Diffuse
bronchial wall thickening with moderate centrilobular and mild
paraseptal emphysema.

Upper Abdomen: Bilateral low-attenuation adrenal nodules, compatible
with adenomas, measuring up to 1.9 cm on the left side, similar to
prior studies. Aortic atherosclerosis.

Musculoskeletal: There are no aggressive appearing lytic or blastic
lesions noted in the visualized portions of the skeleton.
IMPRESSION: 1. Today's study is highly concerning for local recurrence of
disease with increasing conspicuity of mass-like area in the left
hilar region extending into the left upper lobe with what appears to
be malignant soft tissue extending along the posterior aspect of the
left upper lobe abutting the major fissure, as well as new bilateral
pleural effusions (small on the left and trace on the right).
2. Previously noted nodularity and architectural distortion in the
right upper lobe appear stable.
3. Aortic atherosclerosis, in addition to three-vessel coronary
artery disease. Assessment for potential risk factor modification,
dietary therapy or pharmacologic therapy may be warranted, if
clinically indicated.
4. Diffuse bronchial wall thickening with moderate centrilobular and
mild paraseptal emphysema; imaging findings suggestive of underlying
COPD.
5. Mild cardiomegaly.

Aortic Atherosclerosis (KQAC3-EJD.D) and Emphysema (KQAC3-OF1.1).

## 2020-05-26 IMAGING — DX PORTABLE CHEST - 1 VIEW
1 series · 1 of 1 positions shown · non-contrast
Comparison: Chest CT 10/26/2018 and earlier.

CLINICAL DATA: 77-year-old male with lung cancer and COPD on home
BiPAP. Shortness of breath.

EXAM:
PORTABLE CHEST 1 VIEW

[chest ap]
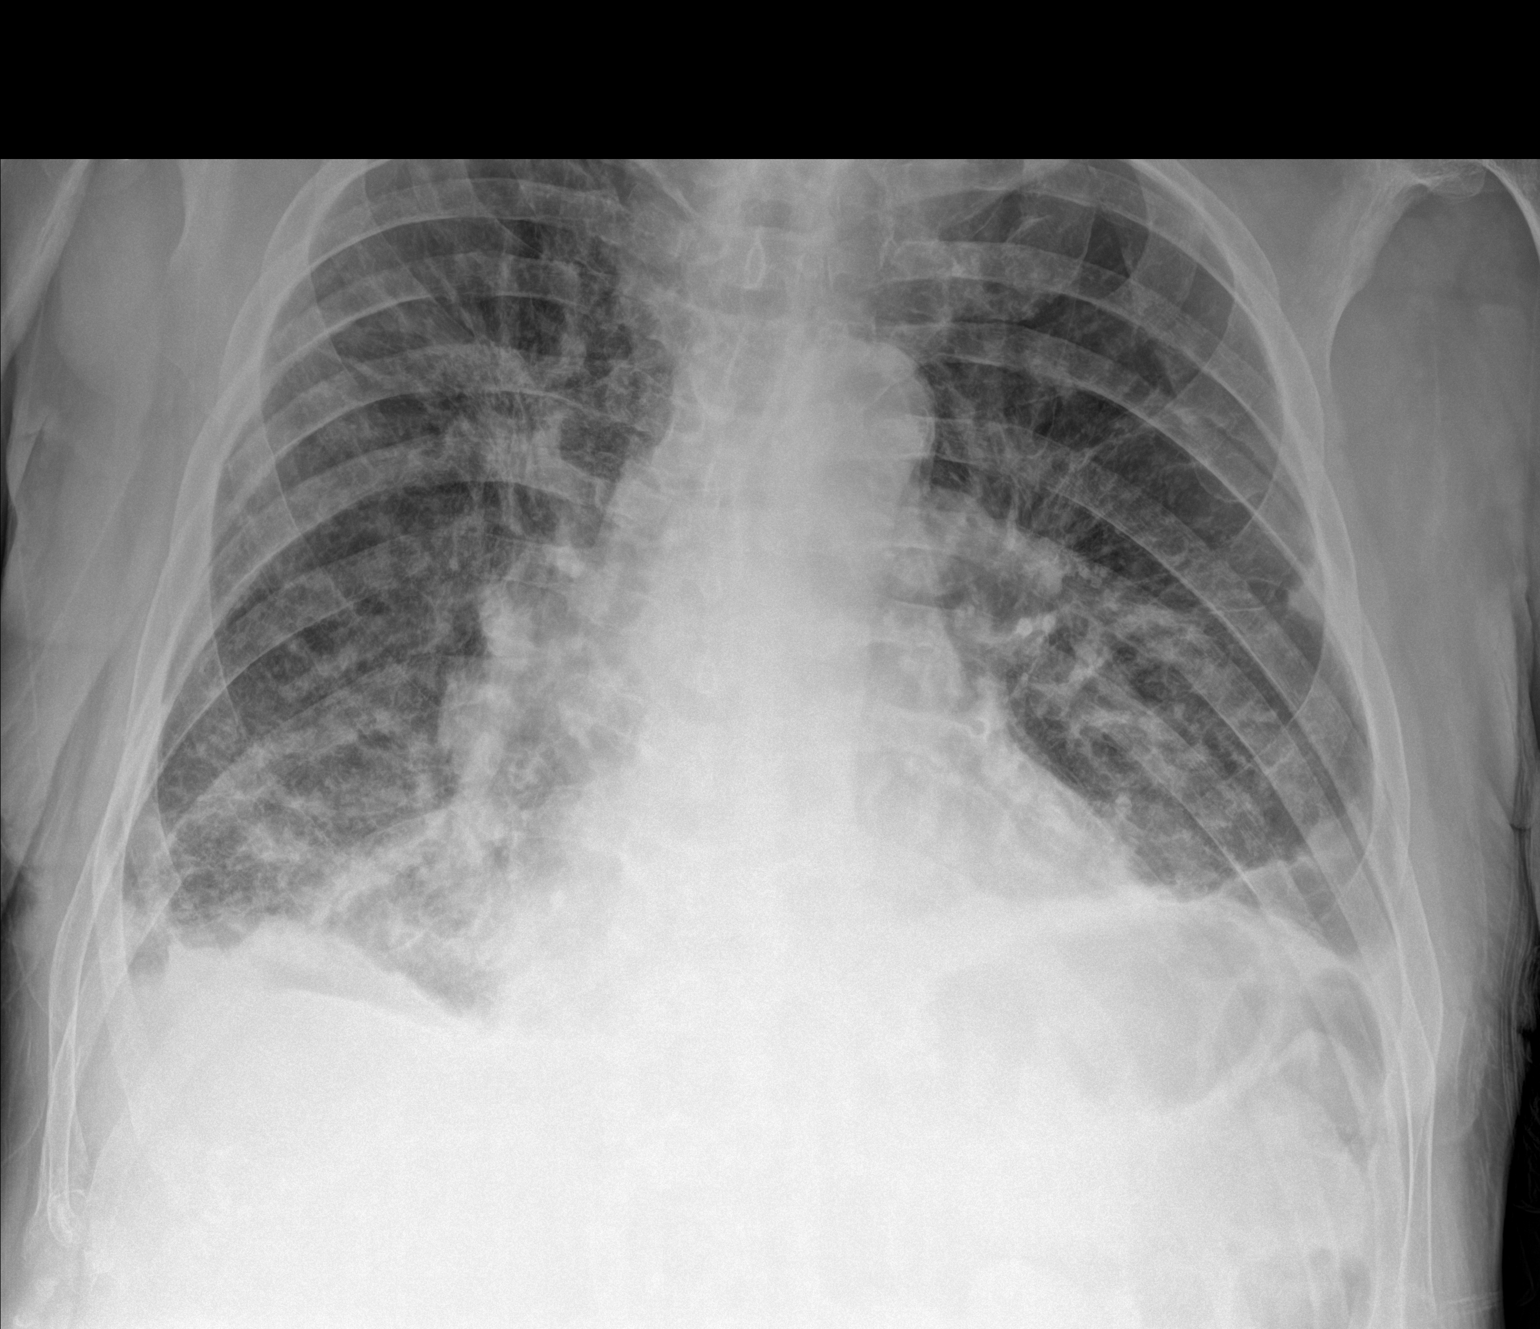

[1 of 1 positions shown; findings below may reference images not displayed]

FINDINGS: Portable AP upright view at 5440 hours. Right suprahilar masslike
opacity redemonstrated. Stable lung volumes since [REDACTED].
Increased bilateral hilar density since [REDACTED], likely stable from
the recent CT. Stable cardiac size. Visualized tracheal air column
is within normal limits.

No pneumothorax. Persistent small bilateral pleural effusions.
Patchy and indistinct increased right basilar opacity since
[REDACTED], might be stable from the recent CT.

Negative visible bowel gas pattern.
IMPRESSION: 1. Similar to the recent chest CT 10/26/2018. Right suprahilar
masslike opacity and Small bilateral pleural effusions.
2. Patchy and indistinct right lung base opacity, questionably
increased from the recent CT, consider acute infectious
exacerbation.
